# Patient Record
Sex: Male | Born: 1935 | Race: Black or African American | Hispanic: No | Marital: Married | Smoking: Former smoker
Health system: Southern US, Community
[De-identification: ages and names within clinical notes are randomized; demographics above are authoritative.]

## PROBLEM LIST (undated history)

## (undated) DIAGNOSIS — C801 Malignant (primary) neoplasm, unspecified: Secondary | ICD-10-CM

## (undated) DIAGNOSIS — E7800 Pure hypercholesterolemia, unspecified: Secondary | ICD-10-CM

---

## 2006-03-11 ENCOUNTER — Encounter: Admission: RE | Admit: 2006-03-11 | Discharge: 2006-03-11 | Payer: Self-pay | Admitting: Internal Medicine

## 2017-09-29 DIAGNOSIS — C801 Malignant (primary) neoplasm, unspecified: Secondary | ICD-10-CM

## 2017-09-29 HISTORY — DX: Malignant (primary) neoplasm, unspecified: C80.1

## 2017-10-15 ENCOUNTER — Encounter: Payer: Self-pay | Admitting: Radiation Oncology

## 2017-10-20 NOTE — Progress Notes (Signed)
  Oncology Nurse Navigator Documentation  Navigator Location: [facility] (10/20/17 1641) Referral date to RadOnc/MedOnc: 10/14/17 (10/20/17 1641) )Navigator Encounter Type: Introductory phone call (10/20/17 1641)   Abnormal Finding Date: 09/29/17 (10/20/17 1641) Confirmed Diagnosis Date: 09/29/17 (10/20/17 1641)                 Treatment Phase: Pre-Tx/Tx Discussion (10/20/17 1641) Barriers/Navigation Needs: Coordination of Care (10/20/17 1641)   Interventions: Education;Psycho-social support (10/20/17 1641)  I called patient and his wife to introduce myself and my role as GI navigator. Patient and wife aware of appointment on 10/22/17 with Dr. Burr Medico.    Education Method: Verbal (10/20/17 1641)      Acuity: Level 1 (10/20/17 1641)         Time Spent with Patient: 15 (10/20/17 1641)

## 2017-10-21 ENCOUNTER — Encounter: Payer: Self-pay | Admitting: Radiation Oncology

## 2017-10-21 DIAGNOSIS — C20 Malignant neoplasm of rectum: Secondary | ICD-10-CM | POA: Insufficient documentation

## 2017-10-21 NOTE — Progress Notes (Signed)
GI Location of Tumor / Histology: Rectal Cancer (4.5cm above anal verge)  Adrian Banks presented  months ago with symptoms of: anemia  Biopsies of  (if applicable) YMEBRAXE:94/07/680:SU intestine ascending Colon,Polyp=tubular adenoma ,LG Intestine Rectum bx= Invasive moderately differentiated adenocarcinoma,  Past/Anticipated interventions by surgeon, if any: Dr. Earnstine Regal, MD 09/29/17 :Colonoscopy with biopsy, follow up office 2 months   Past/Anticipated interventions by medical oncology, if any:  Dr. Burr Medico appt 10/22/17   Weight changes, if any:  5 lb loss,   Bowel/Bladder complaints, if any:   loose stools, bladder normal  Nausea / Vomiting, if any: No Pain issues, if any:  No  Any blood per rectum:  no  SAFETY ISSUES: No  Prior radiation? NO  Pacemaker/ICD?NO Is the patient on methotrexate?  Current Complaints/Details:    Chronic kidney disease, Occasional alcohol use,   Vitals taken in Med/Onc today: T=98.3,B/P=151/88,P=98,RR=18,Oxygen sats=100% Weight=166.4lb  Allergies:NKA Wt Readings from Last 3 Encounters:  10/22/17 166 lb 4.8 oz (75.4 kg)

## 2017-10-22 ENCOUNTER — Ambulatory Visit (HOSPITAL_BASED_OUTPATIENT_CLINIC_OR_DEPARTMENT_OTHER): Payer: Medicare Other | Admitting: Hematology

## 2017-10-22 ENCOUNTER — Encounter: Payer: Self-pay | Admitting: Radiation Oncology

## 2017-10-22 ENCOUNTER — Ambulatory Visit
Admission: RE | Admit: 2017-10-22 | Discharge: 2017-10-22 | Disposition: A | Discharge status: Home / Self Care | Payer: Medicare Other | Source: Ambulatory Visit | Attending: Radiation Oncology | Admitting: Radiation Oncology

## 2017-10-22 ENCOUNTER — Encounter: Payer: Self-pay | Admitting: Hematology

## 2017-10-22 ENCOUNTER — Ambulatory Visit (HOSPITAL_BASED_OUTPATIENT_CLINIC_OR_DEPARTMENT_OTHER): Payer: Medicare Other

## 2017-10-22 VITALS — Ht 70.0 in

## 2017-10-22 DIAGNOSIS — D509 Iron deficiency anemia, unspecified: Secondary | ICD-10-CM

## 2017-10-22 DIAGNOSIS — D500 Iron deficiency anemia secondary to blood loss (chronic): Secondary | ICD-10-CM

## 2017-10-22 DIAGNOSIS — C20 Malignant neoplasm of rectum: Secondary | ICD-10-CM | POA: Insufficient documentation

## 2017-10-22 DIAGNOSIS — Z79899 Other long term (current) drug therapy: Secondary | ICD-10-CM | POA: Insufficient documentation

## 2017-10-22 DIAGNOSIS — Z809 Family history of malignant neoplasm, unspecified: Secondary | ICD-10-CM | POA: Insufficient documentation

## 2017-10-22 DIAGNOSIS — Z87891 Personal history of nicotine dependence: Secondary | ICD-10-CM | POA: Insufficient documentation

## 2017-10-22 DIAGNOSIS — Z86010 Personal history of colonic polyps: Secondary | ICD-10-CM | POA: Insufficient documentation

## 2017-10-22 DIAGNOSIS — Z510 Encounter for antineoplastic radiation therapy: Secondary | ICD-10-CM | POA: Insufficient documentation

## 2017-10-22 HISTORY — DX: Malignant (primary) neoplasm, unspecified: C80.1

## 2017-10-22 LAB — CBC WITH DIFFERENTIAL/PLATELET
BASO%: 1.4 % (ref 0.0–2.0)
BASOS ABS: 0.1 10*3/uL (ref 0.0–0.1)
EOS%: 0.8 % (ref 0.0–7.0)
Eosinophils Absolute: 0.1 10*3/uL (ref 0.0–0.5)
HCT: 27.6 % — ABNORMAL LOW (ref 38.4–49.9)
HGB: 8 g/dL — ABNORMAL LOW (ref 13.0–17.1)
LYMPH%: 13.3 % — AB (ref 14.0–49.0)
MCH: 18.8 pg — AB (ref 27.2–33.4)
MCHC: 29 g/dL — AB (ref 32.0–36.0)
MCV: 64.9 fL — AB (ref 79.3–98.0)
MONO#: 0.8 10*3/uL (ref 0.1–0.9)
MONO%: 7.9 % (ref 0.0–14.0)
NEUT#: 7.8 10*3/uL — ABNORMAL HIGH (ref 1.5–6.5)
NEUT%: 76.6 % — ABNORMAL HIGH (ref 39.0–75.0)
NRBC: 0 % (ref 0–0)
Platelets: 307 10*3/uL (ref 140–400)
RBC: 4.25 10*6/uL (ref 4.20–5.82)
RDW: 23.6 % — AB (ref 11.0–14.6)
WBC: 10.2 10*3/uL (ref 4.0–10.3)
lymph#: 1.4 10*3/uL (ref 0.9–3.3)

## 2017-10-22 LAB — COMPREHENSIVE METABOLIC PANEL
ALT: 12 U/L (ref 0–55)
AST: 17 U/L (ref 5–34)
Albumin: 3.6 g/dL (ref 3.5–5.0)
Alkaline Phosphatase: 66 U/L (ref 40–150)
Anion Gap: 8 mEq/L (ref 3–11)
BUN: 20 mg/dL (ref 7.0–26.0)
CHLORIDE: 111 meq/L — AB (ref 98–109)
CO2: 23 meq/L (ref 22–29)
Calcium: 9.3 mg/dL (ref 8.4–10.4)
Creatinine: 1.3 mg/dL (ref 0.7–1.3)
GLUCOSE: 93 mg/dL (ref 70–140)
POTASSIUM: 4.1 meq/L (ref 3.5–5.1)
SODIUM: 142 meq/L (ref 136–145)
TOTAL PROTEIN: 7 g/dL (ref 6.4–8.3)
Total Bilirubin: 0.49 mg/dL (ref 0.20–1.20)

## 2017-10-22 LAB — TECHNOLOGIST REVIEW

## 2017-10-22 MED ORDER — CAPECITABINE 500 MG PO TABS: 825.0000 mg/m2 | ORAL_TABLET | Freq: Two times a day (BID) | ORAL | 1 refills | Status: DC

## 2017-10-22 NOTE — Progress Notes (Signed)
nur

## 2017-10-22 NOTE — Progress Notes (Signed)
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   HISTORY OF PRESENT ILLNESS: Adrian Banks is a 83 y.o. male seen at the request of Dr. Marcello Moores for a new diagnosis of rectal cancer. The patient had been experiencing rectal bleeding and anemia and underwent colonoscopy with Dr. Penelope Coop on 09/30/17 revealeing a mass in the rectum  And an ascending colon polyp. His polyp was a tubular adenoma on biopsy, and his rectal mass was an invasive, moderately differentiated adenocarcinom. He was seen by Dr. Marcello Moores and staging scans were ordered as well as an MRI of the pelvis which have not been performed yet. He comes today to discuss options of treatment for his cancer.   PREVIOUS RADIATION THERAPY: No   PAST MEDICAL HISTORY:  Past Medical History:  Diagnosis Date  . Cancer ([place]) 09/29/2017   Rectal cvancer       PAST SURGICAL HISTORY:No past surgical history on file.   FAMILY HISTORY:  Family History  Problem Relation Age of Onset  . Cancer Mother        cancer (unknown type)     SOCIAL HISTORY:  reports that he quit smoking about 30 years ago. He has a 5.00 pack-year smoking history. he has never used smokeless tobacco. He reports that he drinks alcohol. He reports that he does not use drugs. The patient is married and lives in [place].   ALLERGIES: Patient has no allergy information on record.   MEDICATIONS:  Current Outpatient Medications  Medication Sig Dispense Refill  . finasteride (PROSCAR) 5 MG tablet Take 5 mg by mouth.    . lovastatin (MEVACOR) 40 MG tablet Take 40 mg by mouth.     No current facility-administered medications for this encounter.      REVIEW OF SYSTEMS: On  review of systems, the patient reports that he is doing well overall. He reports he does not notice blood in his stool, but has had darker stool since taking iron. He denies any chest pain, shortness of breath, cough, fevers, chills, night sweats, unintended weight changes. He denies any bowel or bladder disturbances, and denies abdominal pain, nausea or vomiting. He denies any new musculoskeletal or joint aches or pains. A complete review of systems is obtained and is otherwise negative.     PHYSICAL EXAM:  Wt Readings from Last 3 Encounters:  10/22/17 166 lb 4.8 oz (75.4 kg)   Temp Readings from Last 3 Encounters:  10/22/17 98.3 F (36.8 C) (Oral)   BP Readings from Last 3 Encounters:  10/22/17 (!) 151/88   Pulse Readings from Last 3 Encounters:  10/22/17 98   Pain Assessment Pain Score: 0-No pain/10  In general this is a well appearing African American male in no acute distress. He is alert and oriented x4 and appropriate throughout the examination. HEENT reveals that the patient is normocephalic, atraumatic. EOMs are intact. PERRLA. Skin is intact without any evidence of gross lesions. Cardiovascular exam reveals a regular rate and rhythm, no clicks rubs or murmurs are auscultated. Chest is clear to auscultation bilaterally. Lymphatic assessment is performed and does not reveal any adenopathy in the cervical, supraclavicular, axillary, or inguinal chains. Abdomen has active bowel sounds  in all quadrants and is intact. The abdomen is soft, non tender, non distended. Lower extremities are negative for pretibial pitting edema, deep calf tenderness, cyanosis or clubbing. Rectal exam is deferred.   ECOG = 0  0 - Asymptomatic (Fully active, able to carry on all predisease activities without restriction)  1 - Symptomatic but completely ambulatory (Restricted in physically strenuous activity but ambulatory and able to carry out work of a light or sedentary nature. For example, light  housework, office work)  2 - Symptomatic, <50% in bed during the day (Ambulatory and capable of all self care but unable to carry out any work activities. Up and about more than 50% of waking hours)  3 - Symptomatic, >50% in bed, but not bedbound (Capable of only limited self-care, confined to bed or chair 50% or more of waking hours)  4 - Bedbound (Completely disabled. Cannot carry on any self-care. Totally confined to bed or chair)  5 - Death   Eustace Pen MM, Creech RH, Tormey DC, et al. [phone]). "Toxicity and response criteria of the [facility]". [place] Oncol. 5 (6): 649-55    LABORATORY DATA:  No results found for: WBC, HGB, HCT, MCV, PLT No results found for: NA, K, CL, CO2 No results found for: ALT, AST, GGT, ALKPHOS, BILITOT    RADIOGRAPHY: No results found.     IMPRESSION/PLAN: 1. Unstaged Adenocarcinoma of the Rectum. Dr. Lisbeth Renshaw discusses the pathology findings and reviews the nature of adenocarcinoma of the rectum. Dr. Lisbeth Renshaw discusses the importance of completing his staging scans. We will await the results of this, but based on his diagnosis, we would anticipate a role for ChemoRT. Dr. Lisbeth Renshaw discusses the rationale for this approach. We discussed the risks, benefits, short, and long term effects of radiotherapy, and the patient is interested in proceeding. Dr. Lisbeth Renshaw discusses the delivery and logistics of radiotherapy and anticipates a course of 5 1/2 weeks. We will follow up with him after he's had imaging. We've tentatively set up an appointment for simulation on Wednesday 10/28/17, and would anticipate starting the week of 11/02/17.   The above documentation reflects my direct findings during this shared patient visit. Please see the separate note by Dr. Lisbeth Renshaw on this date for the remainder of the patient's plan of care.    Carola Rhine, PAC

## 2017-10-22 NOTE — Progress Notes (Signed)
Please see the Nurse Progress Note in the MD Initial Consult Encounter for this patient. 

## 2017-10-22 NOTE — Progress Notes (Signed)
[place]  [phone] Fax:([phone]) [place] Note   [name] 10/22/2017  REFERRAL PHYSICIAN: Dr. Marcello Moores   CHIEF COMPLAINTS/PURPOSE OF CONSULTATION:  Rectal Cancer    Rectal cancer ([place])   09/29/2017 Procedure    Colonoscopy by Dr. Penelope Coop 09/29/17  Findings:  The perianal and digital rectal examination were normal.  A non-obstructing large mass was found in the proximal rectum and in the mid rectum.  The mass was circumferential.  This was biopsied with a cold forceps for histology.  Area was tattooed with an injection of [place] ink both distal and proximal to the mass.  There is about 4-5 cm of rectal mucosa distal to the mass that looks norma. This mass is about 7-8 cm in length.  A 10 mm polyp was found in the ascending colon. The polyp was sessile. the polyp was removed with a hot snare.  Resection and retrieval were complete.        09/29/2017 Initial Biopsy    FINAL DIAGNOSIS 09/29/17  1. LG Intestine - Ascending Colon Polyp:  TUBULAR ADENOMA 2. LG Intestine - Rectum, Bx: INVASIVE MODERATELY DIFFERENTIATED ADENOCARCINOMA. THE MSI TESTING IS PENDING AND THE RESULT WILL BE REPORTED AS AN ADDENDUM.      10/21/2017 Initial Diagnosis    Rectal cancer ([place])        HISTORY OF PRESENTING ILLNESS: 10/22/17  Adrian Banks 83 y.o. male is here because of newly diagnosed rectal cancer. He was referred by Dr. Marcello Moores. He presents to the clinic today by himself.   Pt is a poor historian. He denies any significant abdominal discomfort, nausea, bloating, rectal bleeding or change in bowel habits.  He was referred to GI doctor [place] for colonoscopy by his PCP due to deficient anemia, which showed a nonobstructing mass in the proximal rectum to mid rectum, biopsy showed invasive adenocarcinoma.  He was referred to colorectal surgeon Dr. Marcello Moores, who recommended surgical  resection.  Marcello Moores referred patient to [place] and to Dr. Lisbeth Renshaw for evaluation of new adjuvant chemoradiation.  Marcello Moores has ordered staging CT chest, abdomen and pelvis, and pelvic MRI for staging these scans has not been scheduled yet.   He has had some dysuria in the past few months, was seen by his PCP and diagnosed with BPH. He started proscar.  He is married, lives with his wife, daughter independent and functions well.   REVIEW OF SYSTEMS:   Constitutional: Denies fevers, chills or abnormal night sweats, (+) mild fatigue  Eyes: Denies blurriness of vision, double vision or watery eyes Ears, nose, mouth, throat, and face: Denies mucositis or sore throat Respiratory: Denies cough, dyspnea or wheezes Cardiovascular: Denies palpitation, chest discomfort or lower extremity swelling Gastrointestinal:  Denies nausea, heartburn or change in bowel habits Skin: Denies abnormal skin rashes Lymphatics: Denies new lymphadenopathy or easy bruising Neurological:Denies numbness, tingling or new weaknesses Behavioral/Psych: Mood is stable, no new changes  All other systems were reviewed with the patient and are negative.   MEDICAL HISTORY:  Past Medical History:  Diagnosis Date  . Cancer ([place]) 09/29/2017   Rectal cvancer    SURGICAL HISTORY: History reviewed. No pertinent surgical history.  SOCIAL HISTORY: Social History   Socioeconomic History  . Marital status: Married    Spouse name: Not on file  . Number of children: Not on file  . Years of education: Not on file  . Highest education level: Not  on file  Social Needs  . Financial resource strain: Not on file  . Food insecurity - worry: Not on file  . Food insecurity - inability: Not on file  . Transportation needs - medical: Not on file  . Transportation needs - non-medical: Not on file  Occupational History  . Not on file  Tobacco Use  . Smoking status: Former Smoker    Packs/day: 0.50    Years: 10.00    Pack years: 5.00    Last  attempt to quit: 1988    Years since quitting: 30.8  . Smokeless tobacco: Never Used  Substance and Sexual Activity  . Alcohol use: Yes    Comment: ocassionally   . Drug use: No  . Sexual activity: Not on file  Other Topics Concern  . Not on file  Social History Narrative  . Not on file    FAMILY HISTORY: Family History  Problem Relation Age of Onset  . Cancer Mother        cancer (unknown type)    ALLERGIES:  has no allergies on file.  MEDICATIONS:  Current Outpatient Medications  Medication Sig Dispense Refill  . finasteride (PROSCAR) 5 MG tablet Take 5 mg by mouth.    . lovastatin (MEVACOR) 40 MG tablet Take 40 mg by mouth.    . capecitabine (XELODA) 500 MG tablet Take 3 tablets (1,500 mg total) 2 (two) times daily after a meal by mouth. 90 tablet 1   No current facility-administered medications for this visit.     REVIEW OF SYSTEMS:   Constitutional: Denies fevers, chills or abnormal night sweats Eyes: Denies blurriness of vision, double vision or watery eyes Ears, nose, mouth, throat, and face: Denies mucositis or sore throat Respiratory: Denies cough, dyspnea or wheezes Cardiovascular: Denies palpitation, chest discomfort or lower extremity swelling Gastrointestinal:  Denies nausea, heartburn or change in bowel habits Skin: Denies abnormal skin rashes Lymphatics: Denies new lymphadenopathy or easy bruising Neurological:Denies numbness, tingling or new weaknesses Behavioral/Psych: Mood is stable, no new changes  All other systems were reviewed with the patient and are negative.  PHYSICAL EXAMINATION: ECOG PERFORMANCE STATUS: 0 - Asymptomatic  Vitals:   10/22/17 1236  BP: (!) 151/88  Pulse: 98  Resp: 18  Temp: 98.3 F (36.8 C)  SpO2: 100%   Filed Weights   10/22/17 1236  Weight: 166 lb 4.8 oz (75.4 kg)    GENERAL:alert, no distress and comfortable SKIN: skin color, texture, turgor are normal, no rashes or significant lesions EYES: normal,  conjunctiva are pink and non-injected, sclera clear OROPHARYNX:no exudate, no erythema and lips, buccal mucosa, and tongue normal  NECK: supple, thyroid normal size, non-tender, without nodularity LYMPH:  no palpable lymphadenopathy in the cervical, axillary or inguinal LUNGS: clear to auscultation and percussion with normal breathing effort HEART: regular rate & rhythm and no murmurs and no lower extremity edema ABDOMEN:abdomen soft, non-tender and normal bowel sounds, rectal exam was deferred today  Musculoskeletal:no cyanosis of digits and no clubbing  PSYCH: alert & oriented x 3 with fluent speech NEURO: no focal motor/sensory deficits  LABORATORY DATA:  I have reviewed the data as listed CBC Latest Ref Rng & Units 10/22/2017  WBC 4.0 - 10.3 10e3/uL 10.2  Hemoglobin 13.0 - 17.1 g/dL 8.0(L)  Hematocrit 38.4 - 49.9 % 27.6(L)  Platelets 140 - 400 10e3/uL 307    CMP Latest Ref Rng & Units 10/22/2017  Glucose 70 - 140 mg/dl 93  BUN 7.0 - 26.0 mg/dL 20.0  Creatinine 0.7 - 1.3 mg/dL 1.3  Sodium 136 - 145 mEq/L 142  Potassium 3.5 - 5.1 mEq/L 4.1  CO2 22 - 29 mEq/L 23  Calcium 8.4 - 10.4 mg/dL 9.3  Total Protein 6.4 - 8.3 g/dL 7.0  Total Bilirubin 0.20 - 1.20 mg/dL 0.49  Alkaline Phos 40 - 150 U/L 66  AST 5 - 34 U/L 17  ALT 0 - 55 U/L 12    PATHOLOGY   FINAL DIAGNOSIS 09/29/17  1. LG Intestine - Ascending Colon Polyp:  TUBULAR ADENOMA 2. LG Intestine - Rectum, Bx: INVASIVE MODERATELY DIFFERENTIATED ADENOCARCINOMA. THE MSI TESTING IS PENDING AND THE RESULT WILL BE REPORTED AS AN ADDENDUM.    PROCEDURES   Colonoscopy by Dr. Penelope Coop 09/29/17  Findings:  The perianal and digital rectal examination were normal.  A non-obstructing large mass was found in the proximal rectum and in the mid rectum.  The mass was circumferential.  This was biopsied with a cold forceps for histology.  Area was tattooed with an injection of [place] ink both distal and proximal to the mass.  There is  about 4-5 cm of rectal mucosa distal to the mass that looks norma. This mass is about 7-8 cm in length.  A 10 mm polyp was found in the ascending colon. The polyp was sessile. the polyp was removed with a hot snare.  Resection and retrieval were complete.     RADIOGRAPHIC STUDIES: I have personally reviewed the radiological images as listed and agreed with the findings in the report. No results found.  ASSESSMENT & PLAN:  Adrian Banks is a 83 y.o. male without a significant past medical history, presented with iron deficient anemia.  He underwent a colonoscopy which showed a non-obstructive mass from proximal rectum to mid rectum.   1. Rectal Cancer, invasive adenocarcinoma, TxNxMx -I reviewed his colonoscopy and biopsy pathology results in great details with patient  -He is staging CT chest, abdomen and pelvis with contrast and pelvic MRI was ordered by his surgeon Dr. Marcello Moores, pending schedule -From his Crohn anoscopy finding, this is likely a locally advanced rectal cancer.  If he has no metastatic disease, he probably will benefit from neoadjuvant chemotherapy.  We discussed that neoadjuvant chemo and radiation is a standard care for T3/4 or node positive disease  -I reviewed the benefits and logistics of neoadjuvant   Although he is 83 years old, he has no significant medical comorbidities, good performance status, will be a candidate for current chemo and radiation.  I discussed the option of Xeloda versus intravenous 5-FU continuous infusion.  Due to the convenience and better tolerance, I recommend him to consider Xeloda 83m/m2 q12h with concurrent radiation.  --Chemotherapy consent: Side effects including but does not not limited to, fatigue, nausea, vomiting, diarrhea, hair loss, neuropathy, fluid retention, renal and kidney dysfunction, neutropenic fever, needed for blood transfusion, bleeding, coronary artery spasm, skin toxicities, were discussed with patient in great detail. He  agrees to proceed. -Plan to start in a few week, we will try to get his staging scans done next week  -due to his advanced age, if he has poor tolerance to concurrent chemo and radiation, I will probably decrease his chemo dose or hold chemo, and continue his radiation.   2. Anemia, iron deficient  -Labs today showed anemia with hemoglobin 8.0, MCV 64.9, normal WBC and platelet, this is likely iron deficient anemia, secondary to his rectal cancer bleeding. -I will check his iron level, and set up his IV  iron infusion  PLAN: -will schedule his CT CAP with contrast and pelvic MRI for staging next week -check iron study and set up iv iron  -I will see him back in 1-2 weeks after staging scans, to finalize his treatment plan  -will send Xeloda prescription out to get him ready for neoadjuvant chemo and radiation  -I spoke with Dr. Lisbeth Renshaw today to coordinate his care    Orders Placed This Encounter  Procedures  . CBC with Differential    Standing Status:   Standing    Number of Occurrences:   50    Standing Expiration Date:   10/22/2022  . Comprehensive metabolic panel    Standing Status:   Standing    Number of Occurrences:   50    Standing Expiration Date:   10/22/2022  . CEA    Standing Status:   Standing    Number of Occurrences:   50    Standing Expiration Date:   10/22/2022    All questions were answered. The patient knows to call the clinic with any problems, questions or concerns.  I spent 40 minutes counseling the patient face to face. The total time spent in the appointment was 55 minutes and more than 50% was on counseling.     Truitt Merle, MD 10/22/2017 11:41 PM   This document serves as a record of services personally performed by Truitt Merle, MD. It was created on her behalf by Joslyn Devon, a trained medical scribe. The creation of this record is based on the scribe's personal observations and the provider's statements to them.    I have reviewed the above  documentation for accuracy and completeness, and I agree with the above.

## 2017-10-22 NOTE — Progress Notes (Signed)
  Oncology Nurse Navigator Documentation  Navigator Location: [facility] (10/22/17 1412)   )Navigator Encounter Type: Initial MedOnc (10/22/17 1412)                     Patient Visit Type: MedOnc;Initial (10/22/17 1412) Treatment Phase: Pre-Tx/Tx Discussion (10/22/17 1412) Barriers/Navigation Needs: Education (10/22/17 1412) Education: Understanding Cancer/ Treatment Options (10/22/17 1412) Interventions: Education;Psycho-social support (10/22/17 1412)  I met with patient before and during his office visit with Dr. Burr Medico. I asked patient how he learns best and he requested written materials. I provided patient with literature from [employer] related to Colon/rectal cancer.  Patient was unaccompanied during his office visit but stated that he has adult children in the area. I explained to patient with his consent we can speak to someone from his family to review what was discussed during his visit. I accompanied patient to his rad/onc appointment. Dr. Burr Medico requested that patient have labs today. I called down to rad/onc and spoke with Sheppard Plumber and she will send patient to lab after he is finished with Dr. Lisbeth Renshaw. This patient will benefit from repeated educational opportunities.   Education Method: Verbal;Written (10/22/17 1412)      Acuity: Level 2 (10/22/17 1412)   Acuity Level 2: Ongoing guidance and education throughout treatment as needed;Educational needs;Initial guidance, education and coordination as needed (10/22/17 1412)     Time Spent with Patient: 30 (10/22/17 1412)

## 2017-10-23 ENCOUNTER — Encounter: Payer: Self-pay | Admitting: Hematology

## 2017-10-23 ENCOUNTER — Other Ambulatory Visit: Payer: Self-pay | Admitting: General Surgery

## 2017-10-23 DIAGNOSIS — D500 Iron deficiency anemia secondary to blood loss (chronic): Secondary | ICD-10-CM | POA: Insufficient documentation

## 2017-10-23 DIAGNOSIS — C20 Malignant neoplasm of rectum: Secondary | ICD-10-CM

## 2017-10-23 LAB — CEA (IN HOUSE-CHCC): CEA (CHCC-In House): 8.16 ng/mL — ABNORMAL HIGH (ref 0.00–5.00)

## 2017-10-23 MED ORDER — CAPECITABINE 500 MG PO TABS: 825.0000 mg/m2 | ORAL_TABLET | Freq: Two times a day (BID) | ORAL | 1 refills | Status: DC

## 2017-10-23 NOTE — Progress Notes (Signed)
  Oncology Nurse Navigator Documentation  Navigator Location: [facility] (10/23/17 1400)   )                              Interventions: Coordination of Care (10/23/17 1400)   Coordination of Care: Appts (10/23/17 1400)  Per Dr. Ernestina Penna request, CT scan and MR rescheduled for next week prior to initiating chemo and radiation. Central scheduling to call patient with new dates and times and pertinent instructions.     Acuity: Level 2 (10/23/17 1400)   Acuity Level 2: Assistance expediting appointments (10/23/17 1400)     Time Spent with Patient: 30 (10/23/17 1400)

## 2017-10-26 ENCOUNTER — Telehealth: Payer: Self-pay | Admitting: Pharmacist

## 2017-10-26 DIAGNOSIS — C20 Malignant neoplasm of rectum: Secondary | ICD-10-CM

## 2017-10-26 MED ORDER — CAPECITABINE 500 MG PO TABS: ORAL_TABLET | ORAL | 0 refills | Status: DC

## 2017-10-26 MED ORDER — CAPECITABINE 500 MG PO TABS: 825.0000 mg/m2 | ORAL_TABLET | Freq: Two times a day (BID) | ORAL | 0 refills | Status: DC

## 2017-10-26 NOTE — Telephone Encounter (Signed)
Oral Oncology Pharmacist Encounter  Received new prescription for Xeloda (capecitabine) for the neoadjuvant treatment of newly diagnosed rectal cancer in conjunction with radiation, planned duration 5-6 weeks of therapy for neoadjuvant course. CT simulation scheduled for 11/21, noted hopeful radiation/Xeloda start date 11/02/17.  Labs from 10/22/17 assessed, OK for treatment. Noted SCr=1.3, est CrCl ~45 mL/min, OK for treatment  Current medication list in Epic reviewed, no DDIs with Xeloda identified.  Prescription has been e-scribed to the [facility] for benefits analysis and approval. No prior authorization was required, however copayment for entire radiation course $709.53. No copayment support is currently available for Xeloda and patient's diagnosis. There is no manufacturer assistance programs for this medication. This will be discussed with MD prior to speaking to patient and dispensing from pharmacy.  Oral Oncology Clinic will continue to follow for copayment issues, initial counseling and start date.  Johny Drilling, PharmD, BCPS, BCOP 10/26/2017 10:22 AM Oral Oncology Clinic [phone]

## 2017-10-28 ENCOUNTER — Ambulatory Visit
Admission: RE | Admit: 2017-10-28 | Discharge: 2017-10-28 | Disposition: A | Discharge status: Home / Self Care | Payer: Medicare Other | Source: Ambulatory Visit | Attending: Radiation Oncology | Admitting: Radiation Oncology

## 2017-10-28 ENCOUNTER — Telehealth: Payer: Self-pay | Admitting: Hematology

## 2017-10-28 ENCOUNTER — Ambulatory Visit (HOSPITAL_COMMUNITY)
Admission: RE | Admit: 2017-10-28 | Discharge: 2017-10-28 | Disposition: A | Discharge status: Home / Self Care | Payer: Medicare Other | Source: Ambulatory Visit | Attending: General Surgery | Admitting: General Surgery

## 2017-10-28 ENCOUNTER — Other Ambulatory Visit: Payer: Self-pay | Admitting: Hematology

## 2017-10-28 ENCOUNTER — Encounter (HOSPITAL_COMMUNITY): Payer: Self-pay

## 2017-10-28 DIAGNOSIS — R918 Other nonspecific abnormal finding of lung field: Secondary | ICD-10-CM | POA: Insufficient documentation

## 2017-10-28 DIAGNOSIS — Z87891 Personal history of nicotine dependence: Secondary | ICD-10-CM | POA: Diagnosis not present

## 2017-10-28 DIAGNOSIS — I700 Atherosclerosis of aorta: Secondary | ICD-10-CM | POA: Insufficient documentation

## 2017-10-28 DIAGNOSIS — C20 Malignant neoplasm of rectum: Secondary | ICD-10-CM | POA: Insufficient documentation

## 2017-10-28 DIAGNOSIS — N210 Calculus in bladder: Secondary | ICD-10-CM | POA: Diagnosis not present

## 2017-10-28 DIAGNOSIS — Z86010 Personal history of colonic polyps: Secondary | ICD-10-CM | POA: Diagnosis not present

## 2017-10-28 DIAGNOSIS — N200 Calculus of kidney: Secondary | ICD-10-CM | POA: Insufficient documentation

## 2017-10-28 DIAGNOSIS — Z510 Encounter for antineoplastic radiation therapy: Secondary | ICD-10-CM | POA: Diagnosis not present

## 2017-10-28 DIAGNOSIS — Z809 Family history of malignant neoplasm, unspecified: Secondary | ICD-10-CM | POA: Diagnosis not present

## 2017-10-28 DIAGNOSIS — Z79899 Other long term (current) drug therapy: Secondary | ICD-10-CM | POA: Diagnosis not present

## 2017-10-28 MED ORDER — IOPAMIDOL (ISOVUE-300) INJECTION 61%
INTRAVENOUS | Status: AC
  Filled 2017-10-28: qty 100

## 2017-10-28 MED ORDER — IOPAMIDOL (ISOVUE-300) INJECTION 61%
100.0000 mL | Freq: Once | INTRAVENOUS | Status: AC | PRN
  Administered 2017-10-28: 100 mL via INTRAVENOUS

## 2017-10-28 NOTE — Telephone Encounter (Signed)
Spoke with patient dtr re appointments for 11/26 and 12/3. Message to [place] re seeing patient in infusion due to infusion time.

## 2017-10-28 NOTE — Telephone Encounter (Signed)
Oral Oncology Pharmacist Encounter  Attempted to contact patient to update on high copayment for Xeloda and offer initial counseling.  No answer on provided number in Epic, voicemail not set up so unable to leave a message.  Xeloda copayment $700.  Patient may be eligible for [place] ($400) if able to be signed up for this fund by financial advocates in radiation oncology. That would still leave out of pocket expense for patient at $300 for entire course of concurrent chemoradiation therapy.  Oral Oncology Clinic will continue to follow for copayment issues, initial counseling and start date.  Johny Drilling, PharmD, BCPS, BCOP 10/28/2017 3:31 PM Oral Oncology Clinic [phone]

## 2017-10-30 ENCOUNTER — Ambulatory Visit (HOSPITAL_COMMUNITY): Admission: RE | Admit: 2017-10-30 | Payer: Medicare Other | Source: Ambulatory Visit

## 2017-11-02 ENCOUNTER — Ambulatory Visit (HOSPITAL_BASED_OUTPATIENT_CLINIC_OR_DEPARTMENT_OTHER): Payer: Medicare Other | Admitting: Hematology

## 2017-11-02 ENCOUNTER — Encounter: Payer: Self-pay | Admitting: Hematology

## 2017-11-02 ENCOUNTER — Ambulatory Visit (HOSPITAL_BASED_OUTPATIENT_CLINIC_OR_DEPARTMENT_OTHER): Payer: Medicare Other

## 2017-11-02 ENCOUNTER — Telehealth: Payer: Self-pay

## 2017-11-02 ENCOUNTER — Other Ambulatory Visit (HOSPITAL_BASED_OUTPATIENT_CLINIC_OR_DEPARTMENT_OTHER): Payer: Medicare Other

## 2017-11-02 ENCOUNTER — Other Ambulatory Visit: Payer: Medicare Other

## 2017-11-02 ENCOUNTER — Ambulatory Visit (HOSPITAL_COMMUNITY)
Admission: RE | Admit: 2017-11-02 | Discharge: 2017-11-02 | Disposition: A | Discharge status: Home / Self Care | Payer: Medicare Other | Source: Ambulatory Visit | Attending: Hematology | Admitting: Hematology

## 2017-11-02 ENCOUNTER — Other Ambulatory Visit: Payer: Self-pay | Admitting: *Deleted

## 2017-11-02 ENCOUNTER — Ambulatory Visit: Payer: Medicare Other | Admitting: Hematology

## 2017-11-02 ENCOUNTER — Ambulatory Visit: Payer: Medicare Other

## 2017-11-02 VITALS — BP 132/67 | HR 76 | Temp 98.1°F | Resp 17 | Wt 165.5 lb

## 2017-11-02 DIAGNOSIS — D509 Iron deficiency anemia, unspecified: Secondary | ICD-10-CM

## 2017-11-02 DIAGNOSIS — C20 Malignant neoplasm of rectum: Secondary | ICD-10-CM | POA: Diagnosis not present

## 2017-11-02 DIAGNOSIS — D500 Iron deficiency anemia secondary to blood loss (chronic): Secondary | ICD-10-CM | POA: Diagnosis present

## 2017-11-02 DIAGNOSIS — R911 Solitary pulmonary nodule: Secondary | ICD-10-CM

## 2017-11-02 DIAGNOSIS — R918 Other nonspecific abnormal finding of lung field: Secondary | ICD-10-CM | POA: Diagnosis not present

## 2017-11-02 DIAGNOSIS — Z7189 Other specified counseling: Secondary | ICD-10-CM

## 2017-11-02 LAB — COMPREHENSIVE METABOLIC PANEL
ALT: 12 U/L (ref 0–55)
ANION GAP: 9 meq/L (ref 3–11)
AST: 16 U/L (ref 5–34)
Albumin: 3.3 g/dL — ABNORMAL LOW (ref 3.5–5.0)
Alkaline Phosphatase: 63 U/L (ref 40–150)
BILIRUBIN TOTAL: 0.47 mg/dL (ref 0.20–1.20)
BUN: 19.6 mg/dL (ref 7.0–26.0)
CALCIUM: 8.9 mg/dL (ref 8.4–10.4)
CO2: 22 meq/L (ref 22–29)
CREATININE: 1.2 mg/dL (ref 0.7–1.3)
Chloride: 110 mEq/L — ABNORMAL HIGH (ref 98–109)
EGFR: >60 mL/min/{1.73_m2} (ref >60)
Glucose: 100 mg/dl (ref 70–140)
Potassium: 3.7 mEq/L (ref 3.5–5.1)
Sodium: 141 mEq/L (ref 136–145)
TOTAL PROTEIN: 6.6 g/dL (ref 6.4–8.3)

## 2017-11-02 LAB — CBC WITH DIFFERENTIAL/PLATELET
BASO%: 2.3 % — ABNORMAL HIGH (ref 0.0–2.0)
BASOS ABS: 0.2 10*3/uL — AB (ref 0.0–0.1)
EOS%: 2.9 % (ref 0.0–7.0)
Eosinophils Absolute: 0.3 10*3/uL (ref 0.0–0.5)
HCT: 25.5 % — ABNORMAL LOW (ref 38.4–49.9)
HGB: 7.3 g/dL — ABNORMAL LOW (ref 13.0–17.1)
LYMPH%: 15.6 % (ref 14.0–49.0)
MCH: 18.5 pg — ABNORMAL LOW (ref 27.2–33.4)
MCHC: 28.6 g/dL — AB (ref 32.0–36.0)
MCV: 64.7 fL — AB (ref 79.3–98.0)
MONO#: 0.9 10*3/uL (ref 0.1–0.9)
MONO%: 10.3 % (ref 0.0–14.0)
NEUT#: 5.9 10*3/uL (ref 1.5–6.5)
NEUT%: 68.9 % (ref 39.0–75.0)
NRBC: 0 % (ref 0–0)
Platelets: 306 10*3/uL (ref 140–400)
RBC: 3.94 10*6/uL — AB (ref 4.20–5.82)
RDW: 22.2 % — AB (ref 11.0–14.6)
WBC: 8.5 10*3/uL (ref 4.0–10.3)
lymph#: 1.3 10*3/uL (ref 0.9–3.3)

## 2017-11-02 LAB — ABO/RH: ABO/RH(D): A POS

## 2017-11-02 LAB — CEA (IN HOUSE-CHCC): CEA (CHCC-IN HOUSE): 8.1 ng/mL — AB (ref 0.00–5.00)

## 2017-11-02 LAB — PREPARE RBC (CROSSMATCH)

## 2017-11-02 LAB — TECHNOLOGIST REVIEW

## 2017-11-02 MED ORDER — SODIUM CHLORIDE 0.9 % IV SOLN
510.0000 mg | Freq: Once | INTRAVENOUS | Status: AC
  Administered 2017-11-02: 510 mg via INTRAVENOUS
  Filled 2017-11-02: qty 17

## 2017-11-02 MED ORDER — SODIUM CHLORIDE 0.9 % IV SOLN
Freq: Once | INTRAVENOUS | Status: AC
  Administered 2017-11-02: 08:00:00 via INTRAVENOUS

## 2017-11-02 MED FILL — CAPECITABINE 500 MG TABLET: 500 | 20 days supply | Qty: 90 | Fill #0

## 2017-11-02 NOTE — Telephone Encounter (Signed)
Oral Chemotherapy Pharmacist Encounter   I spoke with patient for overview of: Xeloda.   Pt is doing well. Counseled patient on administration, dosing, side effects, monitoring, drug-food interactions, safe handling, storage, and disposal.  Patient will take Xeloda 500mg  tablets, 3 tablets (1500mg ) by mouth in AM and 3 tabs (1500mg ) by mouth in PM, within 30 minutes of finishing meals, on days of radiation only. Xeloda and radiation start date: 11/05/17  Side effects include but not limited to: fatigue, decreased blood counts, GI upset, diarrhea, and hand-foot syndrome. Patient know he can take loperamide at home and will call the office if diarrhea develops.    Reviewed with patient importance of keeping a medication schedule and plan for any missed doses.  Mr. Lamagna voiced understanding and appreciation.   All questions answered.  Patient informed about copayment by [facility], he can afford this if it is split into 2 fills and pays half now and half in 2 weeks. This will be coordinated with the pharmacy and patient plans to pick up his Xeloda tomorrow 11/27 Patient knows to wait until 11/29 to start his Xeloda.   Medication reconciliation performed and medication/allergy list updated.  Patient knows to call the office with questions or concerns. [facility] will continue to follow.  Thank you,  Johny Drilling, PharmD, BCPS, BCOP 11/02/2017  4:03 PM [facility] [phone]

## 2017-11-02 NOTE — Patient Instructions (Signed)

## 2017-11-02 NOTE — Progress Notes (Signed)
[facility]  Telephone:(336) [phone] Fax:(336) [phone]  Clinic Follow Up Note   [name] 11/02/2017   CHIEF COMPLAINTS:  Follow up rectal Cancer    Rectal cancer ([place])   09/29/2017 Procedure    Colonoscopy by Dr. Penelope Coop 09/29/17  Findings:  The perianal and digital rectal examination were normal.  A non-obstructing large mass was found in the proximal rectum and in the mid rectum.  The mass was circumferential.  This was biopsied with a cold forceps for histology.  Area was tattooed with an injection of Niger ink both distal and proximal to the mass.  There is about 4-5 cm of rectal mucosa distal to the mass that looks norma. This mass is about 7-8 cm in length.  A 10 mm polyp was found in the ascending colon. The polyp was sessile. the polyp was removed with a hot snare.  Resection and retrieval were complete.        09/29/2017 Initial Biopsy    FINAL DIAGNOSIS 09/29/17  1. LG Intestine - Ascending Colon Polyp:  TUBULAR ADENOMA 2. LG Intestine - Rectum, Bx: INVASIVE MODERATELY DIFFERENTIATED ADENOCARCINOMA. THE MSI TESTING IS PENDING AND THE RESULT WILL BE REPORTED AS AN ADDENDUM.      10/21/2017 Initial Diagnosis    Rectal cancer (Adrian Banks)      10/28/2017 Imaging    CT CAP W Contrast  IMPRESSION: 1. Long segment circumferential wall thickening of the rectosigmoid colon consistent with known adenocarcinoma. No evidence of bowel obstruction or perforation. 2. No definite signs of metastatic disease in the abdomen or pelvis. Soft tissue nodularity in the right pelvis is probably due to an unopacified pelvic vein. 3. Multiple pulmonary nodules, some cavitary. These are not typical of metastatic adenocarcinoma (especially without evidence of abdominal disease). Consider multicentric or metastatic lung cancer. PET-CT may be helpful to assess hypermetabolic activity and guided tissue  sampling. 4. Multiple bladder calculi consistent with chronic bladder outlet obstruction from prostatomegaly. Nonobstructing right renal calculus. No hydronephrosis. 5. Aortic Atherosclerosis (ICD10-I70.0). Additional incidental findings including a moderate size hiatal hernia and renal cysts.        Radiation Therapy    Radiation with Dr. Lisbeth Renshaw With Xeloda, plan to start 11/05/17       Chemotherapy    Chemoradiation with Xeloda 845m/m2 q12h, plan to start 11/05/17        HISTORY OF PRESENTING ILLNESS: 10/22/17  Adrian Banks Comment83y.o. male is here because of newly diagnosed rectal cancer. He was referred by Dr. TMarcello Moores He presents to the clinic today by himself.   Pt is a poor historian. He denies any significant abdominal discomfort, nausea, bloating, rectal bleeding or change in bowel habits.  He was referred to GI doctor Ganem for colonoscopy by his PCP due to deficient anemia, which showed a nonobstructing mass in the proximal rectum to mid rectum, biopsy showed invasive adenocarcinoma.  He was referred to colorectal surgeon Dr. TMarcello Moores who recommended surgical resection.  TMarcello Mooresreferred patient to u[place] to Dr. MLisbeth Renshawfor evaluation of new adjuvant chemoradiation.  TMarcello Mooreshas ordered staging CT chest, abdomen and pelvis, and pelvic MRI for staging these scans has not been scheduled yet.   He has had some dysuria in the past few months, was seen by his PCP and diagnosed with BPH. He started Proscar.  He is married, lives with his wife, daughter independent and functions well.   CURRENT THERAPY: PENDING  Chemoradiation with Xeloda 1516m ( 8181mm2) q12h plans to start 11/05/17   INTERVAL HISTORY:  ClKHIYAN CRACEs here for a follow up. He presents in the infusion room today to get IV Feraheme due to his 7.3 Hg today. He notes he will get SOB and fatigue which he thinks may be related to his anemia. He notes he plans to start radiation with Dr. MoLisbeth Renshawn 11/05/17. He has not  received a call for his Xeloda prescription. He notes to having 3 BM a day and does not have to strain. He thinks he has more gas than hardness of stool. He does not use any stool softer. He understands his age and knows he cannot love forever. So he wonders will these treatments allow for him to have quality of life. That is what matters more to him. He has wife, son and daughter at home who can help him. His other 2 children live elsewhere. He does not want to contact his children about this.   REVIEW OF SYSTEMS:   Constitutional: Denies fevers, chills or abnormal night sweats, (+) fatigue  Eyes: Denies blurriness of vision, double vision or watery eyes Ears, nose, mouth, throat, and face: Denies mucositis or sore throat Respiratory: Denies cough, dyspnea or wheezes (+) occasional SOB Cardiovascular: Denies palpitation, chest discomfort or lower extremity swelling Gastrointestinal:  Denies nausea, heartburn or change in bowel habits Skin: Denies abnormal skin rashes Lymphatics: Denies new lymphadenopathy or easy bruising Neurological:Denies numbness, tingling or new weaknesses Behavioral/Psych: Mood is stable, no new changes  All other systems were reviewed with the patient and are negative.   MEDICAL HISTORY:  Past Medical History:  Diagn[place]  [place]   Rectal [place]cer    SURGICAL HISTORY: History reviewed. No pertinent surgical history.  SOCIAL HISTORY: Social History   Socioeconomic History  . Marital status: Married    Spouse name: Not on file  . Number of children: Not on file  . Years of education: Not on file  . Highest education level: Not on file  Social Needs  . Financial resource strain: Not on file  . Food insecurity - worry: Not on file  . Food insecurity - inability: Not on file  . Transportation needs - medical: Not on file  . Transportation needs - non-medical: Not on file  Occupational History  . Not on file  Tobacco Use  . Smoking  status: Former Smoker    Packs/day: 0.50    Years: 10.00    Pack years: 5.00    Last attempt to quit: 1988    Years since quitting: 30.9  . Smokeless tobacco: Never Used  Substance and Sexual Activity  . Alcohol use: Yes    Comment: ocassionally   . Drug use: No  . Sexual activity: Not on file  Other Topics Concern  . Not on file  Social History Narrative  . Not on file    FAMILY HISTORY: Family History  Problem Relation Age of Onset  . Cancer Mother        cancer (unknown type)    ALLERGIES:  has no allergies on file.  MEDICATIONS:  Current Outpatient Medications  Medication Sig Dispense Refill  . capecitabine (XELODA) 500 MG tablet Take 3 tablets (150088mby mouth 2 times daily, within 30 minutes of food, on radiation days only, M-F 180 tablet 0  . finasteride (PROSCAR) 5 MG tablet Take 5 mg by mouth.    . lovastatin (MEVACOR) 40 MG tablet Take  40 mg by mouth.     No current facility-administered medications for this visit.    PHYSICAL EXAMINATION: ECOG PERFORMANCE STATUS: 1 Weight 165 pounds, blood pressure 132/67, heart rate 76, respiratory rate 17, pulse ox 100% on room air, temperature 36.7 GENERAL:alert, no distress and comfortable SKIN: skin color, texture, turgor are normal, no rashes or significant lesions EYES: normal, conjunctiva are pink and non-injected, sclera clear OROPHARYNX:no exudate, no erythema and lips, buccal mucosa, and tongue normal  NECK: supple, thyroid normal size, non-tender, without nodularity LYMPH:  no palpable lymphadenopathy in the cervical, axillary or inguinal LUNGS: clear to auscultation and percussion with normal breathing effort HEART: regular rate & rhythm and no murmurs and no lower extremity edema ABDOMEN:abdomen soft, non-tender and normal bowel sounds, rectal exam was deferred today  Musculoskeletal:no cyanosis of digits and no clubbing  PSYCH: alert & oriented x 3 with fluent speech NEURO: no focal motor/sensory  deficits  LABORATORY DATA:  I have reviewed the data as listed CBC Latest Ref Rng & Units 11/02/2017 10/22/2017  WBC 4.0 - 10.3 10e3/uL 8.5 10.2  Hemoglobin 13.0 - 17.1 g/dL 7.3(L) 8.0(L)  Hematocrit 38.4 - 49.9 % 25.5(L) 27.6(L)  Platelets 140 - 400 10e3/uL 306 307    CMP Latest Ref Rng & Units 11/02/2017 10/22/2017  Glucose 70 - 140 mg/dl 100 93  BUN 7.0 - 26.0 mg/dL 19.6 20.0  Creatinine 0.7 - 1.3 mg/dL 1.2 1.3  Sodium 136 - 145 mEq/L 141 142  Potassium 3.5 - 5.1 mEq/L 3.7 4.1  CO2 22 - 29 mEq/L 22 23  Calcium 8.4 - 10.4 mg/dL 8.9 9.3  Total Protein 6.4 - 8.3 g/dL 6.6 7.0  Total Bilirubin 0.20 - 1.20 mg/dL 0.47 0.49  Alkaline Phos 40 - 150 U/L 63 66  AST 5 - 34 U/L 16 17  ALT 0 - 55 U/L 12 12    PATHOLOGY   FINAL DIAGNOSIS 09/29/17  1. LG Intestine - Ascending Colon Polyp:  TUBULAR ADENOMA 2. LG Intestine - Rectum, Bx: INVASIVE MODERATELY DIFFERENTIATED ADENOCARCINOMA. THE MSI TESTING IS PENDING AND THE RESULT WILL BE REPORTED AS AN ADDENDUM.    PROCEDURES   Colonoscopy by Dr. Penelope Coop 09/29/17  Findings:  The perianal and digital rectal examination were normal.  A non-obstructing large mass was found in the proximal rectum and in the mid rectum.  The mass was circumferential.  This was biopsied with a cold forceps for histology.  Area was tattooed with an injection of [place] ink both distal and proximal to the mass.  There is about 4-5 cm of rectal mucosa distal to the mass that looks norma. This mass is about 7-8 cm in length.  A 10 mm polyp was found in the ascending colon. The polyp was sessile. the polyp was removed with a hot snare.  Resection and retrieval were complete.     RADIOGRAPHIC STUDIES: I have personally reviewed the radiological images as listed and agreed with the findings in the report. Ct Chest W Contrast  Result Date: 10/28/2017 CLINICAL DATA:  Rectal cancer diagnosed October 12, 2017.  Staging. EXAM: CT CHEST, ABDOMEN, AND PELVIS WITH  CONTRAST TECHNIQUE: Multidetector CT imaging of the chest, abdomen and pelvis was performed following the standard protocol during bolus administration of intravenous contrast. CONTRAST:  132m ISOVUE-300 IOPAMIDOL (ISOVUE-300) INJECTION 61% COMPARISON:  Chest CT 03/11/2006. FINDINGS: CT CHEST FINDINGS Cardiovascular: Mild atherosclerosis of the aorta, great vessels and coronary arteries. No acute vascular findings are demonstrated. The heart size is normal.  There is no pericardial effusion. Mediastinum/Nodes: There are no enlarged mediastinal, hilar or axillary lymph nodes. There is a chronic moderate-size hiatal hernia. The thyroid gland and trachea demonstrate no significant findings. Lungs/Pleura: There is no pleural effusion. Mild emphysema noted. There are several new pulmonary nodules bilaterally. In the right lower lobe, there is a slightly lobulated 2.0 x 1.8 cm nodule on image 96. There is a 5 mm right lower lobe nodule on image 75 and a 10 x 14 mm right middle lobe nodule on image 97. In the left upper lobe, there is a centrally cavitary nodule posteriorly measuring 18 x 13 mm on image 40. There is an additional 7 mm cavitary lesion along the left major fissure on image 67. Musculoskeletal/Chest wall: No chest wall mass or suspicious osseous findings. CT ABDOMEN AND PELVIS FINDINGS Hepatobiliary: The liver is normal in density without focal abnormality. No evidence of gallstones, gallbladder wall thickening or biliary dilatation. Pancreas: Unremarkable. No pancreatic ductal dilatation or surrounding inflammatory changes. Spleen: Normal in size without focal abnormality. Adrenals/Urinary Tract: Mild nodularity of the left adrenal gland is similar to the prior study. The right adrenal gland appears normal. In the upper pole of the right kidney, there is a nonobstructing 9 mm calculus. No other renal or ureteral calculi are demonstrated. However, there are 3 large bladder calculi, measuring up to 22 mm in  diameter. There are multiple low-density renal lesions, measuring up to 6.5 cm posteriorly in the mid right kidney. There are thin calcifications within the wall of this cyst. Those large enough to characterize are consistent with cysts. Some are too small to optimally characterize, although no suspicious renal lesions are seen. Aside from the bladder calculi, the bladder appears unremarkable. Stomach/Bowel: As above, there is a moderate size hiatal hernia which appears chronic. The small bowel and proximal colon appear normal. The appendix appears normal. There is circumferential wall thickening extending from the rectosigmoid junction into the upper rectum (axial images 91-99), spanning approximately 6.8 cm in length on sagittal image 111. No evidence of bowel obstruction or perforation. Vascular/Lymphatic: No definite enlarged abdominopelvic lymph nodes. A 9 x 16 mm nodule in the right pelvis on image 93 is likely venous. There is aortic and branch vessel atherosclerosis. No acute vascular findings. Reproductive: The prostate gland is enlarged, measuring 6.2 x 4.8 cm transverse. It demonstrates heterogeneity and probable median lobe hypertrophy extending into the bladder base. The seminal vesicles appear normal. Other: No evidence of abdominal wall mass or hernia. No ascites. Musculoskeletal: No acute or significant osseous findings. Mild lumbar spondylosis noted. IMPRESSION: 1. Long segment circumferential wall thickening of the rectosigmoid colon consistent with known adenocarcinoma. No evidence of bowel obstruction or perforation. 2. No definite signs of metastatic disease in the abdomen or pelvis. Soft tissue nodularity in the right pelvis is probably due to an unopacified pelvic vein. 3. Multiple pulmonary nodules, some cavitary. These are not typical of metastatic adenocarcinoma (especially without evidence of abdominal disease). Consider multicentric or metastatic lung cancer. PET-CT may be helpful to  assess hypermetabolic activity and guided tissue sampling. 4. Multiple bladder calculi consistent with chronic bladder outlet obstruction from prostatomegaly. Nonobstructing right renal calculus. No hydronephrosis. 5. Aortic Atherosclerosis (ICD10-I70.0). Additional incidental findings including a moderate size hiatal hernia and renal cysts. Electronically Signed   By: Richardean Sale M.D.   On: 10/28/2017 15:01   Ct Abdomen Pelvis W Contrast  Result Date: 10/28/2017 CLINICAL DATA:  Rectal cancer diagnosed October 12, 2017.  Staging. EXAM:  CT CHEST, ABDOMEN, AND PELVIS WITH CONTRAST TECHNIQUE: Multidetector CT imaging of the chest, abdomen and pelvis was performed following the standard protocol during bolus administration of intravenous contrast. CONTRAST:  164m ISOVUE-300 IOPAMIDOL (ISOVUE-300) INJECTION 61% COMPARISON:  Chest CT 03/11/2006. FINDINGS: CT CHEST FINDINGS Cardiovascular: Mild atherosclerosis of the aorta, great vessels and coronary arteries. No acute vascular findings are demonstrated. The heart size is normal. There is no pericardial effusion. Mediastinum/Nodes: There are no enlarged mediastinal, hilar or axillary lymph nodes. There is a chronic moderate-size hiatal hernia. The thyroid gland and trachea demonstrate no significant findings. Lungs/Pleura: There is no pleural effusion. Mild emphysema noted. There are several new pulmonary nodules bilaterally. In the right lower lobe, there is a slightly lobulated 2.0 x 1.8 cm nodule on image 96. There is a 5 mm right lower lobe nodule on image 75 and a 10 x 14 mm right middle lobe nodule on image 97. In the left upper lobe, there is a centrally cavitary nodule posteriorly measuring 18 x 13 mm on image 40. There is an additional 7 mm cavitary lesion along the left major fissure on image 67. Musculoskeletal/Chest wall: No chest wall mass or suspicious osseous findings. CT ABDOMEN AND PELVIS FINDINGS Hepatobiliary: The liver is normal in density  without focal abnormality. No evidence of gallstones, gallbladder wall thickening or biliary dilatation. Pancreas: Unremarkable. No pancreatic ductal dilatation or surrounding inflammatory changes. Spleen: Normal in size without focal abnormality. Adrenals/Urinary Tract: Mild nodularity of the left adrenal gland is similar to the prior study. The right adrenal gland appears normal. In the upper pole of the right kidney, there is a nonobstructing 9 mm calculus. No other renal or ureteral calculi are demonstrated. However, there are 3 large bladder calculi, measuring up to 22 mm in diameter. There are multiple low-density renal lesions, measuring up to 6.5 cm posteriorly in the mid right kidney. There are thin calcifications within the wall of this cyst. Those large enough to characterize are consistent with cysts. Some are too small to optimally characterize, although no suspicious renal lesions are seen. Aside from the bladder calculi, the bladder appears unremarkable. Stomach/Bowel: As above, there is a moderate size hiatal hernia which appears chronic. The small bowel and proximal colon appear normal. The appendix appears normal. There is circumferential wall thickening extending from the rectosigmoid junction into the upper rectum (axial images 91-99), spanning approximately 6.8 cm in length on sagittal image 111. No evidence of bowel obstruction or perforation. Vascular/Lymphatic: No definite enlarged abdominopelvic lymph nodes. A 9 x 16 mm nodule in the right pelvis on image 93 is likely venous. There is aortic and branch vessel atherosclerosis. No acute vascular findings. Reproductive: The prostate gland is enlarged, measuring 6.2 x 4.8 cm transverse. It demonstrates heterogeneity and probable median lobe hypertrophy extending into the bladder base. The seminal vesicles appear normal. Other: No evidence of abdominal wall mass or hernia. No ascites. Musculoskeletal: No acute or significant osseous findings.  Mild lumbar spondylosis noted. IMPRESSION: 1. Long segment circumferential wall thickening of the rectosigmoid colon consistent with known adenocarcinoma. No evidence of bowel obstruction or perforation. 2. No definite signs of metastatic disease in the abdomen or pelvis. Soft tissue nodularity in the right pelvis is probably due to an unopacified pelvic vein. 3. Multiple pulmonary nodules, some cavitary. These are not typical of metastatic adenocarcinoma (especially without evidence of abdominal disease). Consider multicentric or metastatic lung cancer. PET-CT may be helpful to assess hypermetabolic activity and guided tissue sampling. 4. Multiple bladder  calculi consistent with chronic bladder outlet obstruction from prostatomegaly. Nonobstructing right renal calculus. No hydronephrosis. 5. Aortic Atherosclerosis (ICD10-I70.0). Additional incidental findings including a moderate size hiatal hernia and renal cysts. Electronically Signed   By: Richardean Sale M.D.   On: 10/28/2017 15:01    ASSESSMENT & PLAN:  Adrian Banks is a 83 y.o. male without a significant past medical history, presented with iron deficient anemia.  He underwent a colonoscopy which showed a non-obstructive mass from proximal rectum to mid rectum.   1. Rectal Cancer, invasive adenocarcinoma, TxNxMx -I reviewed his colonoscopy and biopsy pathology results in great details with patient  -We discussed his CT Scan from 10/28/17 which shows multiple lung nodules, largest 2cm in RLL, which is very concerning for metastasis. I recommend a lung biopsy to rule out possible lung primary and to determine staging. I explained the risks of lung biopsy. After a lengthy discussion, he states he values his quality of life much more than quantity of his life, and would like to wait on his lung biopsy.  -I suggest a PET scan to get a better view of his disease. He agreed.  -We discussed like he most likely has metastatic disease, the goal of care  would then be palliative to control disease and try to delay symptoms.  He has mild symptoms from his rectal primary, and this is likely getting worse if untreated, especially bowel obstruction.  Given his advanced age, I recommend him to get his primary rectal tumor first.  Also he is not a candidate for intensive chemotherapy, I think he would likely be able to tolerate concurrent Xeloda, for better local control and then show some benefit for his metastatic disease. -I would consider systemic chemo with FOLFOX or Dicie Beam after he completes his radiation.  Patient was quite overwhelmed by the CT scan findings today. I plan to discuss more about his systemic chemotherapy after he completes radiation. - I also discussed the possibility of Lung SBRT after rectal cancer radiation and chemo -I disused his high copay for Xeloda. He reports he is willing to pay if he needs to. He would like to apply for financial assistance. He will proceed with radiation on 11/05/17 even if he cannot obtain his Xeloda this week or if he cannot tolerate it. I again reviewed the side effects of Xeloda. He agreed to proceed with chemoradiation.  -I suggest he takes stool softener if he feels his BMs become harder or less regular.  -Labs show his Hg is 7.3, he has dyspnea on exertion, so I recommend a blood transfusion in the next few days.  -F/u next week    2. Anemia, iron deficient  -10/22/17 labs showed anemia with hemoglobin 8.0, MCV 64.9, normal WBC and platelet, this is likely iron deficient anemia, secondary to his rectal cancer bleeding. -He received IV Feraheme 11/26 and again on 11/30.  -His 11/02/17 Hg was 7.3, I suggest he needs a blood transfusion in the next few days. He agreed.  -He has received first dose of Feraheme today, and will receive second dose next week  3. Lung masses  -Likely metastatic disease from his rectal cancer, also primary lung cancer with metastasis is also possible. -Patient declined  lung biopsy now -PET scan for further evaluation  4. Goal of care discussion  -We again discussed the incurable nature of his cancer, and the overall poor prognosis, especially if he does not have good response to radiation, chemotherapy or progress on chemo -The patient  understands the goal of care is palliative. -I recommend DNR/DNI, he will think about it    PLAN: -PET in 1-2 weeks  -Lab and f/u in 1 weeks  -Blood transfusion in 1-3 days  -Lab today for type and cross  -He will start radiation and concurrent xeloda 1568m bid M-F on November 04, 2017.    Orders Placed This Encounter  Procedures  . NM PET Image Initial (PI) Skull Base To Thigh    Standing Status:   Future    Standing Expiration Date:   11/02/2018    Order Specific Question:   If indicated for the ordered procedure, I authorize the administration of a radiopharmaceutical per Radiology protocol    Answer:   Yes    Order Specific Question:   [facility]    [facility]   Order Specific Question:   Radiology Contrast Protocol - do NOT remove file path    Answer:   [email or web address]  . Type and screen    Standing Status:   Future    Number of Occurrences:   1    Standing Expiration Date:   11/02/2018    All questions were answered. The patient knows to call the clinic with any problems, questions or concerns.  I spent 35 minutes counseling the patient face to face. The total time spent in the appointment was 40 minutes and more than 50% was on counseling.     FTruitt Merle MD 11/02/2017   This document serves as a record of services personally performed by YTruitt Merle MD. It was created on her behalf by AJoslyn Devon a trained medical scribe. The creation of this record is based on the scribe's personal observations and the provider's statements to them.    I have reviewed the above documentation for accuracy and completeness, and I agree with the  above.

## 2017-11-02 NOTE — Telephone Encounter (Signed)
Printed avs and calender for upcoming apointment. Per 11/26 los

## 2017-11-03 ENCOUNTER — Ambulatory Visit (HOSPITAL_COMMUNITY)
Admission: RE | Admit: 2017-11-03 | Discharge: 2017-11-03 | Disposition: A | Discharge status: Home / Self Care | Payer: Medicare Other | Source: Ambulatory Visit | Attending: Hematology | Admitting: Hematology

## 2017-11-03 DIAGNOSIS — D500 Iron deficiency anemia secondary to blood loss (chronic): Secondary | ICD-10-CM | POA: Diagnosis not present

## 2017-11-03 DIAGNOSIS — Z510 Encounter for antineoplastic radiation therapy: Secondary | ICD-10-CM | POA: Diagnosis not present

## 2017-11-03 MED ORDER — SODIUM CHLORIDE 0.9 % IV SOLN: 250.0000 mL | Freq: Once | INTRAVENOUS | Status: DC

## 2017-11-03 NOTE — Discharge Instructions (Signed)

## 2017-11-04 LAB — TYPE AND SCREEN
ABO/RH(D): A POS
Antibody Screen: NEGATIVE
UNIT DIVISION: 0
UNIT DIVISION: 0

## 2017-11-04 LAB — BPAM RBC
Blood Product Expiration Date: 201812142359
Blood Product Expiration Date: 201812152359
ISSUE DATE / TIME: 201811271057
ISSUE DATE / TIME: 201811271057
UNIT TYPE AND RH: 6200
Unit Type and Rh: 6200

## 2017-11-05 ENCOUNTER — Ambulatory Visit
Admission: RE | Admit: 2017-11-05 | Discharge: 2017-11-05 | Disposition: A | Discharge status: Home / Self Care | Payer: Medicare Other | Source: Ambulatory Visit | Attending: Radiation Oncology | Admitting: Radiation Oncology

## 2017-11-05 ENCOUNTER — Other Ambulatory Visit: Payer: Medicare Other

## 2017-11-05 ENCOUNTER — Other Ambulatory Visit: Payer: Self-pay | Admitting: General Surgery

## 2017-11-05 DIAGNOSIS — C20 Malignant neoplasm of rectum: Secondary | ICD-10-CM

## 2017-11-05 DIAGNOSIS — Z510 Encounter for antineoplastic radiation therapy: Secondary | ICD-10-CM | POA: Diagnosis not present

## 2017-11-05 NOTE — Progress Notes (Signed)
patient  Education done, my business card, radiation therapy and you book given,see MD weekly and prn, discussed skin irritating, pain, fatigue, loss appetite, nausea,vomiting,diarrhea, bbay wipes without alcohol, spray bottle, luke warm shower or  bath,no rubbing,scrubbing or scratching affected area,pat dry, get exercise daily, enough rest and sleep, if having diarrhea, imodium over the counter, low fiber diet, may need to eat smaller meals 5-6 with snacks , no fresh vegs or fresh fruit with diarrhea,  Increase protein in diet, drink plenty water , may get urgency,frequency of bladder, bladder spasms,dysuria also, teachback given 2:21 PM

## 2017-11-06 ENCOUNTER — Ambulatory Visit
Admission: RE | Admit: 2017-11-06 | Discharge: 2017-11-06 | Disposition: A | Discharge status: Home / Self Care | Payer: Medicare Other | Source: Ambulatory Visit | Attending: Radiation Oncology | Admitting: Radiation Oncology

## 2017-11-06 DIAGNOSIS — Z510 Encounter for antineoplastic radiation therapy: Secondary | ICD-10-CM | POA: Diagnosis not present

## 2017-11-09 ENCOUNTER — Ambulatory Visit (HOSPITAL_BASED_OUTPATIENT_CLINIC_OR_DEPARTMENT_OTHER): Payer: Medicare Other | Admitting: Nurse Practitioner

## 2017-11-09 ENCOUNTER — Ambulatory Visit
Admission: RE | Admit: 2017-11-09 | Discharge: 2017-11-09 | Disposition: A | Discharge status: Home / Self Care | Payer: Medicare Other | Source: Ambulatory Visit | Attending: Radiation Oncology | Admitting: Radiation Oncology

## 2017-11-09 ENCOUNTER — Other Ambulatory Visit (HOSPITAL_BASED_OUTPATIENT_CLINIC_OR_DEPARTMENT_OTHER): Payer: Medicare Other

## 2017-11-09 ENCOUNTER — Ambulatory Visit (HOSPITAL_BASED_OUTPATIENT_CLINIC_OR_DEPARTMENT_OTHER): Payer: Medicare Other

## 2017-11-09 ENCOUNTER — Encounter: Payer: Self-pay | Admitting: Nurse Practitioner

## 2017-11-09 VITALS — BP 149/99 | HR 87 | Temp 97.1°F | Resp 18 | Ht 70.0 in | Wt 164.8 lb

## 2017-11-09 VITALS — BP 147/73 | HR 65 | Temp 98.1°F | Resp 18

## 2017-11-09 DIAGNOSIS — C20 Malignant neoplasm of rectum: Secondary | ICD-10-CM

## 2017-11-09 DIAGNOSIS — D509 Iron deficiency anemia, unspecified: Secondary | ICD-10-CM

## 2017-11-09 DIAGNOSIS — Z510 Encounter for antineoplastic radiation therapy: Secondary | ICD-10-CM | POA: Diagnosis not present

## 2017-11-09 DIAGNOSIS — D500 Iron deficiency anemia secondary to blood loss (chronic): Secondary | ICD-10-CM

## 2017-11-09 DIAGNOSIS — R918 Other nonspecific abnormal finding of lung field: Secondary | ICD-10-CM | POA: Diagnosis not present

## 2017-11-09 LAB — CBC WITH DIFFERENTIAL/PLATELET
BASO%: 1 % (ref 0.0–2.0)
Basophils Absolute: 0.1 10*3/uL (ref 0.0–0.1)
EOS ABS: 0.1 10*3/uL (ref 0.0–0.5)
EOS%: 1.2 % (ref 0.0–7.0)
HEMATOCRIT: 37.4 % — AB (ref 38.4–49.9)
HGB: 11.7 g/dL — ABNORMAL LOW (ref 13.0–17.1)
LYMPH#: 1.3 10*3/uL (ref 0.9–3.3)
LYMPH%: 17.3 % (ref 14.0–49.0)
MCH: 22.5 pg — ABNORMAL LOW (ref 27.2–33.4)
MCHC: 31.3 g/dL — AB (ref 32.0–36.0)
MCV: 72 fL — AB (ref 79.3–98.0)
MONO#: 0.5 10*3/uL (ref 0.1–0.9)
MONO%: 6.7 % (ref 0.0–14.0)
NEUT%: 73.8 % (ref 39.0–75.0)
NEUTROS ABS: 5.6 10*3/uL (ref 1.5–6.5)
PLATELETS: 241 10*3/uL (ref 140–400)
RBC: 5.2 10*6/uL (ref 4.20–5.82)
RDW: 31.1 % — ABNORMAL HIGH (ref 11.0–14.6)
WBC: 7.6 10*3/uL (ref 4.0–10.3)

## 2017-11-09 LAB — COMPREHENSIVE METABOLIC PANEL
ALK PHOS: 70 U/L (ref 40–150)
ALT: 13 U/L (ref 0–55)
AST: 18 U/L (ref 5–34)
Albumin: 3.6 g/dL (ref 3.5–5.0)
Anion Gap: 8 mEq/L (ref 3–11)
BUN: 17.8 mg/dL (ref 7.0–26.0)
CHLORIDE: 110 meq/L — AB (ref 98–109)
CO2: 22 mEq/L (ref 22–29)
Calcium: 9.1 mg/dL (ref 8.4–10.4)
Creatinine: 1.3 mg/dL (ref 0.7–1.3)
GLUCOSE: 94 mg/dL (ref 70–140)
POTASSIUM: 3.5 meq/L (ref 3.5–5.1)
SODIUM: 140 meq/L (ref 136–145)
Total Bilirubin: 0.67 mg/dL (ref 0.20–1.20)
Total Protein: 6.9 g/dL (ref 6.4–8.3)

## 2017-11-09 LAB — CEA (IN HOUSE-CHCC): CEA (CHCC-In House): 8.7 ng/mL — ABNORMAL HIGH (ref 0.00–5.00)

## 2017-11-09 LAB — TECHNOLOGIST REVIEW

## 2017-11-09 MED ORDER — SODIUM CHLORIDE 0.9 % IV SOLN
Freq: Once | INTRAVENOUS | Status: AC
  Administered 2017-11-09: 15:00:00 via INTRAVENOUS

## 2017-11-09 MED ORDER — FERUMOXYTOL INJECTION 510 MG/17 ML
510.0000 mg | Freq: Once | INTRAVENOUS | Status: AC
  Administered 2017-11-09: 510 mg via INTRAVENOUS
  Filled 2017-11-09: qty 17

## 2017-11-09 NOTE — Progress Notes (Signed)
  Oncology Nurse Navigator Documentation  Navigator Location: [facility] (11/09/17 1529)   )Navigator Encounter Type: Treatment (11/09/17 1529)                   Treatment Initiated Date: 11/05/17 (11/09/17 1529)   Treatment Phase: First Chemo Tx (11/09/17 1529)     Interventions: Psycho-social support (11/09/17 1529)    Met with patient in the infusion room during his first chemotherapy treatment. Patient encouraged to call me with any questions or concerns. No barriers to tx noted.        Acuity: Level 2 (11/09/17 1529)   Acuity Level 2: Ongoing guidance and education throughout treatment as needed (11/09/17 1529)     Time Spent with Patient: 15 (11/09/17 1529)

## 2017-11-09 NOTE — Progress Notes (Signed)
[place]  [phone] Fax:([phone]) [phone]  Clinic Follow up Note   [name] 11/09/2017  CHIEF COMPLAINT: F/u rectal cancer   SUMMARY OF ONCOLOGIC HISTORY:   Rectal cancer ([place])   09/29/2017 Procedure    Colonoscopy by Dr. Penelope Coop 09/29/17  Findings:  The perianal and digital rectal examination were normal.  A non-obstructing large mass was found in the proximal rectum and in the mid rectum.  The mass was circumferential.  This was biopsied with a cold forceps for histology.  Area was tattooed with an injection of [place] ink both distal and proximal to the mass.  There is about 4-5 cm of rectal mucosa distal to the mass that looks norma. This mass is about 7-8 cm in length.  A 10 mm polyp was found in the ascending colon. The polyp was sessile. the polyp was removed with a hot snare.  Resection and retrieval were complete.        09/29/2017 Initial Biopsy    FINAL DIAGNOSIS 09/29/17  1. LG Intestine - Ascending Colon Polyp:  TUBULAR ADENOMA 2. LG Intestine - Rectum, Bx: INVASIVE MODERATELY DIFFERENTIATED ADENOCARCINOMA. THE MSI TESTING IS PENDING AND THE RESULT WILL BE REPORTED AS AN ADDENDUM.      10/21/2017 Initial Diagnosis    Rectal cancer ([place])      10/28/2017 Imaging    CT CAP W Contrast  IMPRESSION: 1. Long segment circumferential wall thickening of the rectosigmoid colon consistent with known adenocarcinoma. No evidence of bowel obstruction or perforation. 2. No definite signs of metastatic disease in the abdomen or pelvis. Soft tissue nodularity in the right pelvis is probably due to an unopacified pelvic vein. 3. Multiple pulmonary nodules, some cavitary. These are not typical of metastatic adenocarcinoma (especially without evidence of abdominal disease). Consider multicentric or metastatic lung cancer. PET-CT may be helpful to assess hypermetabolic activity  and guided tissue sampling. 4. Multiple bladder calculi consistent with chronic bladder outlet obstruction from prostatomegaly. Nonobstructing right renal calculus. No hydronephrosis. 5. Aortic Atherosclerosis (ICD10-I70.0). Additional incidental findings including a moderate size hiatal hernia and renal cysts.        Radiation Therapy    Radiation with Dr. Lisbeth Renshaw With Xeloda, plan to start 11/05/17       Chemotherapy    Chemoradiation with Xeloda 821m/m2 q12h, plan to start 11/05/17     CURRENT THERAPY: Chemoradiation with Xeloda 15025m( 82534m2) q12h start 11/05/17; patient omitted xeloda 11/29 and 11/30, will start with PM dose 11/09/17. IV Feraheme 09/02/17, 11/09/17  INTERVAL HISTORY: Adrian Banks for f/u as scheduled. He began rectal radiation on 11/29; he has not started xeloda yet, has hesitations regarding side effects. He has supply and has discussed dosing instructions with pharmacy. He is tolerating radiation well so far. Denies rectal pain, bleeding, or change in BM. He otherwise has not complaints today.  REVIEW OF SYSTEMS:   Constitutional: Denies fatigue, fevers, chills or abnormal weight loss Eyes: Denies blurriness of vision Ears, nose, mouth, throat, and face: Denies mucositis or sore throat Respiratory: Denies cough or wheezes (+) DOE, stable  Cardiovascular: Denies palpitation, chest discomfort or lower extremity swelling Gastrointestinal:  Denies nausea, heartburn or change in bowel habits, rectal bleeding, or rectal pain Skin: Denies abnormal skin rashes Lymphatics: Denies new lymphadenopathy or easy bruising Neurological:Denies numbness, tingling or new weaknesses Behavioral/Psych: Mood is stable, no new changes  All other systems were reviewed with the patient  and are negative.  MEDICAL HISTORY:  Past Medical History:  Diagnosis Date  . Cancer ([place]) 09/29/2017   Rectal cvancer    SURGICAL HISTORY: No past surgical history on file.  I have  reviewed the social history and family history with the patient and they are unchanged from previous note.  ALLERGIES:  has No Known Allergies.  MEDICATIONS:  Current Outpatient Medications  Medication Sig Dispense Refill  . finasteride (PROSCAR) 5 MG tablet Take 5 mg by mouth.    . lovastatin (MEVACOR) 40 MG tablet Take 40 mg by mouth.    . capecitabine (XELODA) 500 MG tablet Take 3 tablets (15101m) by mouth 2 times daily, within 30 minutes of food, on radiation days only, M-F 180 tablet 0   No current facility-administered medications for this visit.     PHYSICAL EXAMINATION: ECOG PERFORMANCE STATUS: 1 - Symptomatic but completely ambulatory  Vitals:   11/09/17 1338  BP: (!) 149/99  Pulse: 87  Resp: 18  Temp: (!) 97.1 F (36.2 C)  SpO2: 100%   Filed Weights   11/09/17 1338  Weight: 164 lb 12.8 oz (74.8 kg)    GENERAL:alert, no distress and comfortable SKIN: skin color, texture, turgor are normal, no rashes or significant lesions EYES: normal, Conjunctiva are pink and non-injected, sclera clear OROPHARYNX:no exudate, no erythema and lips, buccal mucosa, and tongue normal  NECK: supple, thyroid normal size, non-tender, without nodularity LYMPH:  no palpable cervical, supraclavicular, or axillary lymphadenopathy LUNGS: clear to auscultation and percussion with normal breathing effort HEART: regular rate & rhythm and no murmurs and no lower extremity edema ABDOMEN:abdomen soft, non-tender and normal bowel sounds Musculoskeletal:no cyanosis of digits and no clubbing  NEURO: alert & oriented x 3 with fluent speech, no focal motor/sensory deficits  LABORATORY DATA:  I have reviewed the data as listed CBC Latest Ref Rng & Units 11/09/2017 11/02/2017 10/22/2017  WBC 4.0 - 10.3 10e3/uL 7.6 8.5 10.2  Hemoglobin 13.0 - 17.1 g/dL 11.7(L) 7.3(L) 8.0(L)  Hematocrit 38.4 - 49.9 % 37.4(L) 25.5(L) 27.6(L)  Platelets 140 - 400 10e3/uL 241 306 307     CMP Latest Ref Rng & Units  11/09/2017 11/02/2017 10/22/2017  Glucose 70 - 140 mg/dl 94 100 93  BUN 7.0 - 26.0 mg/dL 17.8 19.6 20.0  Creatinine 0.7 - 1.3 mg/dL 1.3 1.2 1.3  Sodium 136 - 145 mEq/L 140 141 142  Potassium 3.5 - 5.1 mEq/L 3.5 3.7 4.1  CO2 22 - 29 mEq/L 22 22 23   Calcium 8.4 - 10.4 mg/dL 9.1 8.9 9.3  Total Protein 6.4 - 8.3 g/dL 6.9 6.6 7.0  Total Bilirubin 0.20 - 1.20 mg/dL 0.67 0.47 0.49  Alkaline Phos 40 - 150 U/L 70 63 66  AST 5 - 34 U/L 18 16 17   ALT 0 - 55 U/L 13 12 12     RADIOGRAPHIC STUDIES: I have personally reviewed the radiological images as listed and agreed with the findings in the report. No results found.   ASSESSMENT & PLAN: CTOMMEY BARRETis a 83y.o. male without a significant past medical history, presented with iron deficient anemia.  He underwent a colonoscopy which showed a non-obstructive mass from proximal rectum to mid rectum.   1. Rectal Cancer, invasive adenocarcinoma, TxNxMx 2. Anemia, iron deficient 3. Lung masses 4. Goals of care discussion  Mr. DSloneappears stable today. He is tolerating radiation well so far. We reviewed xeloda dosing and possible side effects and management. He has supply and agrees to start  tonight. He will get 2nd dose IV feraheme today, Hgb and indices improved today, now 11.7 up from 7.3 one week ago. Cmet unremarkable. CEA unchanged, 8.7. Physical exam unremarkable. He will continue chemoradiation with Xeloda, 1500 mg BID Monday - Friday with radiation. PET scan to evaluate multiple lung nodules on 11/11/17, we reviewed NPO and time/date. Will f/u on results. We will see him weekly with labs while under treatment.   PLAN: Continue chemoradiation with Xeloda 1500 mg BID Monday - Friday with radiation, patient agrees to begin Xeloda tonight PET 11/11/17 Labs and f/u weekly while under treatment  All questions were answered. The patient knows to call the clinic with any problems, questions or concerns. No barriers to learning was detected.      Alla Feeling, NP 11/09/17

## 2017-11-09 NOTE — Patient Instructions (Signed)

## 2017-11-10 ENCOUNTER — Ambulatory Visit
Admission: RE | Admit: 2017-11-10 | Discharge: 2017-11-10 | Disposition: A | Discharge status: Home / Self Care | Payer: Medicare Other | Source: Ambulatory Visit | Attending: Radiation Oncology | Admitting: Radiation Oncology

## 2017-11-10 DIAGNOSIS — Z510 Encounter for antineoplastic radiation therapy: Secondary | ICD-10-CM | POA: Diagnosis not present

## 2017-11-11 ENCOUNTER — Ambulatory Visit
Admission: RE | Admit: 2017-11-11 | Discharge: 2017-11-11 | Disposition: A | Discharge status: Home / Self Care | Payer: Medicare Other | Source: Ambulatory Visit | Attending: Radiation Oncology | Admitting: Radiation Oncology

## 2017-11-11 ENCOUNTER — Encounter (HOSPITAL_COMMUNITY)
Admission: RE | Admit: 2017-11-11 | Discharge: 2017-11-11 | Disposition: A | Discharge status: Home / Self Care | Payer: Medicare Other | Source: Ambulatory Visit | Attending: Hematology | Admitting: Hematology

## 2017-11-11 DIAGNOSIS — R911 Solitary pulmonary nodule: Secondary | ICD-10-CM | POA: Insufficient documentation

## 2017-11-11 DIAGNOSIS — C20 Malignant neoplasm of rectum: Secondary | ICD-10-CM | POA: Diagnosis present

## 2017-11-11 DIAGNOSIS — Z510 Encounter for antineoplastic radiation therapy: Secondary | ICD-10-CM | POA: Diagnosis not present

## 2017-11-11 LAB — GLUCOSE, CAPILLARY: Glucose-Capillary: 82 mg/dL (ref 65–99)

## 2017-11-11 MED ORDER — FLUDEOXYGLUCOSE F - 18 (FDG) INJECTION
8.1100 | Freq: Once | INTRAVENOUS | Status: AC | PRN
  Administered 2017-11-11: 8.11 via INTRAVENOUS

## 2017-11-12 ENCOUNTER — Ambulatory Visit
Admission: RE | Admit: 2017-11-12 | Discharge: 2017-11-12 | Disposition: A | Discharge status: Home / Self Care | Payer: Medicare Other | Source: Ambulatory Visit | Attending: Radiation Oncology | Admitting: Radiation Oncology

## 2017-11-12 DIAGNOSIS — Z510 Encounter for antineoplastic radiation therapy: Secondary | ICD-10-CM | POA: Diagnosis not present

## 2017-11-13 ENCOUNTER — Ambulatory Visit
Admission: RE | Admit: 2017-11-13 | Discharge: 2017-11-13 | Disposition: A | Discharge status: Home / Self Care | Payer: Medicare Other | Source: Ambulatory Visit | Attending: Radiation Oncology | Admitting: Radiation Oncology

## 2017-11-13 DIAGNOSIS — Z510 Encounter for antineoplastic radiation therapy: Secondary | ICD-10-CM | POA: Diagnosis not present

## 2017-11-16 ENCOUNTER — Ambulatory Visit: Payer: Medicare Other | Admitting: Hematology

## 2017-11-16 ENCOUNTER — Ambulatory Visit: Payer: Medicare Other

## 2017-11-16 ENCOUNTER — Other Ambulatory Visit: Payer: Medicare Other

## 2017-11-17 ENCOUNTER — Ambulatory Visit: Payer: Medicare Other

## 2017-11-17 ENCOUNTER — Telehealth: Payer: Self-pay | Admitting: Hematology

## 2017-11-17 NOTE — Telephone Encounter (Signed)
S/w pt advised appt 12/17 @ 8.30. He will collect a calendar tomorrow.

## 2017-11-18 ENCOUNTER — Ambulatory Visit
Admission: RE | Admit: 2017-11-18 | Discharge: 2017-11-18 | Disposition: A | Discharge status: Home / Self Care | Payer: Medicare Other | Source: Ambulatory Visit | Attending: Radiation Oncology | Admitting: Radiation Oncology

## 2017-11-18 ENCOUNTER — Telehealth: Payer: Self-pay | Admitting: Hematology

## 2017-11-18 DIAGNOSIS — Z510 Encounter for antineoplastic radiation therapy: Secondary | ICD-10-CM | POA: Diagnosis not present

## 2017-11-18 NOTE — Progress Notes (Signed)
[place]  Telephone:(336) [phone] Fax:(336) [phone]  Clinic Follow Up Note   [name]   Date of Service: 11/19/2017   CHIEF COMPLAINTS:  Follow up rectal Cancer    Rectal cancer ([place])   09/29/2017 Procedure    Colonoscopy by Dr. Penelope Coop 09/29/17  Findings:  The perianal and digital rectal examination were normal.  A non-obstructing large mass was found in the proximal rectum and in the mid rectum.  The mass was circumferential.  This was biopsied with a cold forceps for histology.  Area was tattooed with an injection of Niger ink both distal and proximal to the mass.  There is about 4-5 cm of rectal mucosa distal to the mass that looks norma. This mass is about 7-8 cm in length.  A 10 mm polyp was found in the ascending colon. The polyp was sessile. the polyp was removed with a hot snare.  Resection and retrieval were complete.        09/29/2017 Initial Biopsy    FINAL DIAGNOSIS 09/29/17  1. LG Intestine - Ascending Colon Polyp:  TUBULAR ADENOMA 2. LG Intestine - Rectum, Bx: INVASIVE MODERATELY DIFFERENTIATED ADENOCARCINOMA. THE MSI TESTING IS PENDING AND THE RESULT WILL BE REPORTED AS AN ADDENDUM.      10/21/2017 Initial Diagnosis    Rectal cancer ([place])      10/28/2017 Imaging    CT CAP W Contrast  IMPRESSION: 1. Long segment circumferential wall thickening of the rectosigmoid colon consistent with known adenocarcinoma. No evidence of bowel obstruction or perforation. 2. No definite signs of metastatic disease in the abdomen or pelvis. Soft tissue nodularity in the right pelvis is probably due to an unopacified pelvic vein. 3. Multiple pulmonary nodules, some cavitary. These are not typical of metastatic adenocarcinoma (especially without evidence of abdominal disease). Consider multicentric or metastatic lung cancer. PET-CT may be helpful to assess hypermetabolic activity  and guided tissue sampling. 4. Multiple bladder calculi consistent with chronic bladder outlet obstruction from prostatomegaly. Nonobstructing right renal calculus. No hydronephrosis. 5. Aortic Atherosclerosis (ICD10-I70.0). Additional incidental findings including a moderate size hiatal hernia and renal cysts.       11/05/2017 -  Radiation Therapy    Radiation with Dr. Lisbeth Renshaw With Xeloda, plan to start 11/05/17      11/09/2017 -  Chemotherapy    Chemoradiation with Xeloda 810m/m2 q12h, M-F on days of radiation plan to starting 11/09/17      11/11/2017 Imaging    PET Scan  IMPRESSION:  Wall thickening/ mass involving the upper rectum, corresponding to the patient's known rectal adenocarcinoma. Bilateral pulmonary nodules, partially cavitary in the left upper lobe nodule, suspicious for metastases. Notably, cavitary metastases are more common with squamous cell carcinoma rather than the patient's known adenocarcinoma.         HISTORY OF PRESENTING ILLNESS: 10/22/17  [name] Comment83y.o. male is here because of newly diagnosed rectal cancer. He was referred by Dr. TMarcello Moores He presents to the clinic today by himself.   Pt is a poor historian. He denies any significant abdominal discomfort, nausea, bloating, rectal bleeding or change in bowel habits.  He was referred to GI doctor Ganem for colonoscopy by his PCP due to deficient anemia, which showed a nonobstructing mass in the proximal rectum to mid rectum, biopsy showed invasive adenocarcinoma.  He was referred to colorectal surgeon Dr. TMarcello Moores who recommended surgical resection.  [place]arcello Moores[place] patient [place]  and to Dr. Lisbeth Renshaw for evaluation of new adjuvant chemoradiation.  Marcello Moores has ordered staging CT chest, abdomen and pelvis, and pelvic MRI for staging these scans has not been scheduled yet.   He has had some dysuria in the past few months, was seen by his PCP and diagnosed with BPH. He started Proscar.  He is married, lives with  his wife, daughter independent and functions well.   CURRENT THERAPY: Radiation starting 11/05/17 and with Xeloda '1500mg'$  ( '825mg'$ /m2) q12h M-F on days of radiation starting 11/09/17   INTERVAL HISTORY:  KENNEN Banks is here for a follow up. He presents in the clinic today noting this is his 2nd week fo chemo and radiation. He notes no changes since starting. He still has been having loose stool. Use of imodium has relieved this some. He denies blood in stool. He thinks his energy is moderately better. He notes the xeloda is expensive as he is paying about $400 for 90 day supply. He brought the print out of his schedule and reviewed for his understanding. He is late for radiation today and wonders of he can be worked in.    REVIEW OF SYSTEMS:   Constitutional: Denies fevers, chills or abnormal night sweats  (+) improved energy Eyes: Denies blurriness of vision, double vision or watery eyes Ears, nose, mouth, throat, and face: Denies mucositis or sore throat Respiratory: Denies cough, dyspnea or wheezes (+) occasional SOB Cardiovascular: Denies palpitation, chest discomfort or lower extremity swelling Gastrointestinal:  Denies nausea, heartburn (+) Loose stool, moderately controlled with imodium  Skin: Denies abnormal skin rashes Lymphatics: Denies new lymphadenopathy or easy bruising Neurological:Denies numbness, tingling or new weaknesses Behavioral/Psych: Mood is stable, no new changes  All other systems were reviewed with the patient and are negative.   MEDICAL HISTORY:  Past Medical History:  Diagnosis Date  . Cancer ([place]) 09/29/2017   Rectal cvancer    SURGICAL HISTORY: History reviewed. No pertinent surgical history.  SOCIAL HISTORY: Social History   Socioeconomic History  . Marital status: Married    Spouse name: Not on file  . Number of children: Not on file  . Years of education: Not on file  . Highest education level: Not on file  Social Needs  . Financial  resource strain: Not on file  . Food insecurity - worry: Not on file  . Food insecurity - inability: Not on file  . Transportation needs - medical: Not on file  . Transportation needs - non-medical: Not on file  Occupational History  . Not on file  Tobacco Use  . Smoking status: Former Smoker    Packs/day: 0.50    Years: 10.00    Pack years: 5.00    Last attempt to quit: 1988    Years since quitting: 30.9  . Smokeless tobacco: Never Used  Substance and Sexual Activity  . Alcohol use: Yes    Comment: ocassionally   . Drug use: No  . Sexual activity: Not on file  Other Topics Concern  . Not on file  Social History Narrative  . Not on file    FAMILY HISTORY: Family History  Problem Relation Age of Onset  . Cancer Mother        cancer (unknown type)    ALLERGIES:  has No Known Allergies.  MEDICATIONS:  Current Outpatient Medications  Medication Sig Dispense Refill  . capecitabine (XELODA) 500 MG tablet Take 3 tablets ('1500mg'$ ) by mouth 2 times daily, within 30 minutes of food, on radiation  days only, M-F 180 tablet 0  . finasteride (PROSCAR) 5 MG tablet Take 5 mg by mouth.    . lovastatin (MEVACOR) 40 MG tablet Take 40 mg by mouth.     No current facility-administered medications for this visit.    PHYSICAL EXAMINATION: ECOG PERFORMANCE STATUS: 1 Vitals:   11/19/17 1034  BP: (!) 146/84  Pulse: 73  Resp: 20  Temp: 98.7 F (37.1 C)  TempSrc: Oral  SpO2: 99%  Weight: 161 lb 9.6 oz (73.3 kg)  Height: 5' 10"  (1.778 m)    GENERAL:alert, no distress and comfortable SKIN: skin color, texture, turgor are normal, no rashes or significant lesions EYES: normal, conjunctiva are pink and non-injected, sclera clear OROPHARYNX:no exudate, no erythema and lips, buccal mucosa, and tongue normal  NECK: supple, thyroid normal size, non-tender, without nodularity LYMPH:  no palpable lymphadenopathy in the cervical, axillary or inguinal LUNGS: clear to auscultation and  percussion with normal breathing effort HEART: regular rate & rhythm and no murmurs and no lower extremity edema ABDOMEN:abdomen soft, non-tender and normal bowel sounds, rectal exam was deferred today  Musculoskeletal:no cyanosis of digits and no clubbing  PSYCH: alert & oriented x 3 with fluent speech NEURO: no focal motor/sensory deficits  LABORATORY DATA:  I have reviewed the data as listed CBC Latest Ref Rng & Units 11/19/2017 11/09/2017 11/02/2017  WBC 4.0 - 10.3 10e3/uL 5.2 7.6 8.5  Hemoglobin 13.0 - 17.1 g/dL 13.0 11.7(L) 7.3(L)  Hematocrit 38.4 - 49.9 % 41.1 37.4(L) 25.5(L)  Platelets 140 - 400 10e3/uL 169 241 306    CMP Latest Ref Rng & Units 11/19/2017 11/09/2017 11/02/2017  Glucose 70 - 140 mg/dl 109 94 100  BUN 7.0 - 26.0 mg/dL 15.4 17.8 19.6  Creatinine 0.7 - 1.3 mg/dL 1.2 1.3 1.2  Sodium 136 - 145 mEq/L 141 140 141  Potassium 3.5 - 5.1 mEq/L 3.7 3.5 3.7  CO2 22 - 29 mEq/L 24 22 22   Calcium 8.4 - 10.4 mg/dL 9.2 9.1 8.9  Total Protein 6.4 - 8.3 g/dL 6.7 6.9 6.6  Total Bilirubin 0.20 - 1.20 mg/dL 0.61 0.67 0.47  Alkaline Phos 40 - 150 U/L 61 70 63  AST 5 - 34 U/L 20 18 16   ALT 0 - 55 U/L 19 13 12     PATHOLOGY   FINAL DIAGNOSIS 09/29/17  1. LG Intestine - Ascending Colon Polyp:  TUBULAR ADENOMA 2. LG Intestine - Rectum, Bx: INVASIVE MODERATELY DIFFERENTIATED ADENOCARCINOMA. THE MSI TESTING IS PENDING AND THE RESULT WILL BE REPORTED AS AN ADDENDUM.    PROCEDURES   Colonoscopy by Dr. Penelope Coop 09/29/17  Findings:  The perianal and digital rectal examination were normal.  A non-obstructing large mass was found in the proximal rectum and in the mid rectum.  The mass was circumferential.  This was biopsied with a cold forceps for histology.  Area was tattooed with an injection of [place] ink both distal and proximal to the mass.  There is about 4-5 cm of rectal mucosa distal to the mass that looks norma. This mass is about 7-8 cm in length.  A 10 mm polyp was found in  the ascending colon. The polyp was sessile. the polyp was removed with a hot snare.  Resection and retrieval were complete.     RADIOGRAPHIC STUDIES: I have personally reviewed the radiological images as listed and agreed with the findings in the report. Ct Chest W Contrast  Result Date: 10/28/2017 CLINICAL DATA:  Rectal cancer diagnosed October 12, 2017.  Staging. EXAM: CT CHEST, ABDOMEN, AND PELVIS WITH CONTRAST TECHNIQUE: Multidetector CT imaging of the chest, abdomen and pelvis was performed following the standard protocol during bolus administration of intravenous contrast. CONTRAST:  161m ISOVUE-300 IOPAMIDOL (ISOVUE-300) INJECTION 61% COMPARISON:  Chest CT 03/11/2006. FINDINGS: CT CHEST FINDINGS Cardiovascular: Mild atherosclerosis of the aorta, great vessels and coronary arteries. No acute vascular findings are demonstrated. The heart size is normal. There is no pericardial effusion. Mediastinum/Nodes: There are no enlarged mediastinal, hilar or axillary lymph nodes. There is a chronic moderate-size hiatal hernia. The thyroid gland and trachea demonstrate no significant findings. Lungs/Pleura: There is no pleural effusion. Mild emphysema noted. There are several new pulmonary nodules bilaterally. In the right lower lobe, there is a slightly lobulated 2.0 x 1.8 cm nodule on image 96. There is a 5 mm right lower lobe nodule on image 75 and a 10 x 14 mm right middle lobe nodule on image 97. In the left upper lobe, there is a centrally cavitary nodule posteriorly measuring 18 x 13 mm on image 40. There is an additional 7 mm cavitary lesion along the left major fissure on image 67. Musculoskeletal/Chest wall: No chest wall mass or suspicious osseous findings. CT ABDOMEN AND PELVIS FINDINGS Hepatobiliary: The liver is normal in density without focal abnormality. No evidence of gallstones, gallbladder wall thickening or biliary dilatation. Pancreas: Unremarkable. No pancreatic ductal dilatation or  surrounding inflammatory changes. Spleen: Normal in size without focal abnormality. Adrenals/Urinary Tract: Mild nodularity of the left adrenal gland is similar to the prior study. The right adrenal gland appears normal. In the upper pole of the right kidney, there is a nonobstructing 9 mm calculus. No other renal or ureteral calculi are demonstrated. However, there are 3 large bladder calculi, measuring up to 22 mm in diameter. There are multiple low-density renal lesions, measuring up to 6.5 cm posteriorly in the mid right kidney. There are thin calcifications within the wall of this cyst. Those large enough to characterize are consistent with cysts. Some are too small to optimally characterize, although no suspicious renal lesions are seen. Aside from the bladder calculi, the bladder appears unremarkable. Stomach/Bowel: As above, there is a moderate size hiatal hernia which appears chronic. The small bowel and proximal colon appear normal. The appendix appears normal. There is circumferential wall thickening extending from the rectosigmoid junction into the upper rectum (axial images 91-99), spanning approximately 6.8 cm in length on sagittal image 111. No evidence of bowel obstruction or perforation. Vascular/Lymphatic: No definite enlarged abdominopelvic lymph nodes. A 9 x 16 mm nodule in the right pelvis on image 93 is likely venous. There is aortic and branch vessel atherosclerosis. No acute vascular findings. Reproductive: The prostate gland is enlarged, measuring 6.2 x 4.8 cm transverse. It demonstrates heterogeneity and probable median lobe hypertrophy extending into the bladder base. The seminal vesicles appear normal. Other: No evidence of abdominal wall mass or hernia. No ascites. Musculoskeletal: No acute or significant osseous findings. Mild lumbar spondylosis noted. IMPRESSION: 1. Long segment circumferential wall thickening of the rectosigmoid colon consistent with known adenocarcinoma. No evidence  of bowel obstruction or perforation. 2. No definite signs of metastatic disease in the abdomen or pelvis. Soft tissue nodularity in the right pelvis is probably due to an unopacified pelvic vein. 3. Multiple pulmonary nodules, some cavitary. These are not typical of metastatic adenocarcinoma (especially without evidence of abdominal disease). Consider multicentric or metastatic lung cancer. PET-CT may be helpful to assess hypermetabolic activity and guided tissue sampling. 4.  Multiple bladder calculi consistent with chronic bladder outlet obstruction from prostatomegaly. Nonobstructing right renal calculus. No hydronephrosis. 5. Aortic Atherosclerosis (ICD10-I70.0). Additional incidental findings including a moderate size hiatal hernia and renal cysts. Electronically Signed   By: Richardean Sale M.D.   On: 10/28/2017 15:01   Ct Abdomen Pelvis W Contrast  Result Date: 10/28/2017 CLINICAL DATA:  Rectal cancer diagnosed October 12, 2017.  Staging. EXAM: CT CHEST, ABDOMEN, AND PELVIS WITH CONTRAST TECHNIQUE: Multidetector CT imaging of the chest, abdomen and pelvis was performed following the standard protocol during bolus administration of intravenous contrast. CONTRAST:  134m ISOVUE-300 IOPAMIDOL (ISOVUE-300) INJECTION 61% COMPARISON:  Chest CT 03/11/2006. FINDINGS: CT CHEST FINDINGS Cardiovascular: Mild atherosclerosis of the aorta, great vessels and coronary arteries. No acute vascular findings are demonstrated. The heart size is normal. There is no pericardial effusion. Mediastinum/Nodes: There are no enlarged mediastinal, hilar or axillary lymph nodes. There is a chronic moderate-size hiatal hernia. The thyroid gland and trachea demonstrate no significant findings. Lungs/Pleura: There is no pleural effusion. Mild emphysema noted. There are several new pulmonary nodules bilaterally. In the right lower lobe, there is a slightly lobulated 2.0 x 1.8 cm nodule on image 96. There is a 5 mm right lower lobe nodule  on image 75 and a 10 x 14 mm right middle lobe nodule on image 97. In the left upper lobe, there is a centrally cavitary nodule posteriorly measuring 18 x 13 mm on image 40. There is an additional 7 mm cavitary lesion along the left major fissure on image 67. Musculoskeletal/Chest wall: No chest wall mass or suspicious osseous findings. CT ABDOMEN AND PELVIS FINDINGS Hepatobiliary: The liver is normal in density without focal abnormality. No evidence of gallstones, gallbladder wall thickening or biliary dilatation. Pancreas: Unremarkable. No pancreatic ductal dilatation or surrounding inflammatory changes. Spleen: Normal in size without focal abnormality. Adrenals/Urinary Tract: Mild nodularity of the left adrenal gland is similar to the prior study. The right adrenal gland appears normal. In the upper pole of the right kidney, there is a nonobstructing 9 mm calculus. No other renal or ureteral calculi are demonstrated. However, there are 3 large bladder calculi, measuring up to 22 mm in diameter. There are multiple low-density renal lesions, measuring up to 6.5 cm posteriorly in the mid right kidney. There are thin calcifications within the wall of this cyst. Those large enough to characterize are consistent with cysts. Some are too small to optimally characterize, although no suspicious renal lesions are seen. Aside from the bladder calculi, the bladder appears unremarkable. Stomach/Bowel: As above, there is a moderate size hiatal hernia which appears chronic. The small bowel and proximal colon appear normal. The appendix appears normal. There is circumferential wall thickening extending from the rectosigmoid junction into the upper rectum (axial images 91-99), spanning approximately 6.8 cm in length on sagittal image 111. No evidence of bowel obstruction or perforation. Vascular/Lymphatic: No definite enlarged abdominopelvic lymph nodes. A 9 x 16 mm nodule in the right pelvis on image 93 is likely venous. There  is aortic and branch vessel atherosclerosis. No acute vascular findings. Reproductive: The prostate gland is enlarged, measuring 6.2 x 4.8 cm transverse. It demonstrates heterogeneity and probable median lobe hypertrophy extending into the bladder base. The seminal vesicles appear normal. Other: No evidence of abdominal wall mass or hernia. No ascites. Musculoskeletal: No acute or significant osseous findings. Mild lumbar spondylosis noted. IMPRESSION: 1. Long segment circumferential wall thickening of the rectosigmoid colon consistent with known adenocarcinoma. No evidence of bowel  obstruction or perforation. 2. No definite signs of metastatic disease in the abdomen or pelvis. Soft tissue nodularity in the right pelvis is probably due to an unopacified pelvic vein. 3. Multiple pulmonary nodules, some cavitary. These are not typical of metastatic adenocarcinoma (especially without evidence of abdominal disease). Consider multicentric or metastatic lung cancer. PET-CT may be helpful to assess hypermetabolic activity and guided tissue sampling. 4. Multiple bladder calculi consistent with chronic bladder outlet obstruction from prostatomegaly. Nonobstructing right renal calculus. No hydronephrosis. 5. Aortic Atherosclerosis (ICD10-I70.0). Additional incidental findings including a moderate size hiatal hernia and renal cysts. Electronically Signed   By: Richardean Sale M.D.   On: 10/28/2017 15:01   Nm Pet Image Initial (pi) Skull Base To Thigh  Result Date: 11/11/2017 CLINICAL DATA:  Initial treatment strategy for rectal cancer. Pulmonary nodules. EXAM: NUCLEAR MEDICINE PET SKULL BASE TO THIGH TECHNIQUE: 8.11 mCi F-18 FDG was injected intravenously. Full-ring PET imaging was performed from the skull base to thigh after the radiotracer. CT data was obtained and used for attenuation correction and anatomic localization. FASTING BLOOD GLUCOSE:  Value: 82 mg/dl COMPARISON:  CT chest abdomen pelvis dated 10/28/2017.  FINDINGS: NECK: No hypermetabolic cervical lymphadenopathy. CHEST: Bilateral pulmonary nodules, including: --15 mm partially cavitary nodule in the posterior left upper lobe (series 8/ image 22), max SUV 8.0 --13 mm nodule in the anterior right middle lobe (series 8/ image 44), max SUV 3.7 --2.3 cm nodule in the posterior right lung base (series 8/image 45), max SUV 6.0 No hypermetabolic thoracic lymphadenopathy. ABDOMEN/PELVIS: Wall thickening/ mass involving the upper rectum (series 4/image 60), corresponding to the patient's known rectal adenocarcinoma. No hypermetabolic abdominopelvic lymphadenopathy. No suspicious hypermetabolism in the liver, spleen, pancreas, or adrenal glands. Bilateral renal cysts, measuring up to 6.0 cm in the posterior right upper kidney (series 4/ image 109). 11 mm right upper pole renal calculus (series 4/image 105), without hydronephrosis. Multiple large bladder calculi measuring up to 2.4 cm (series 4/ image 162). prostatomegaly, suggesting BPH. Atherosclerotic calcifications the abdominal aorta and branch vessels. SKELETON: No focal hypermetabolic activity to suggest skeletal metastasis. Degenerative changes of the visualized thoracolumbar spine. IMPRESSION: Wall thickening/ mass involving the upper rectum, corresponding to the patient's known rectal adenocarcinoma. Bilateral pulmonary nodules, partially cavitary in the left upper lobe nodule, suspicious for metastases. Notably, cavitary metastases are more common with squamous cell carcinoma rather than the patient's known adenocarcinoma. Consider tissue sampling as clinically warranted. Electronically Signed   By: Julian Hy M.D.   On: 11/11/2017 12:58    ASSESSMENT & PLAN:  AROLDO GALLI is a 83 y.o. male without a significant past medical history, presented with iron deficient anemia.  He underwent a colonoscopy which showed a non-obstructive mass from proximal rectum to mid rectum.   1. Rectal Cancer, invasive  adenocarcinoma, [id number] -I reviewed his colonoscopy and biopsy pathology results in great details with patient  -We discussed his CT Scan from 10/28/17 which shows multiple lung nodules, largest 2cm in RLL, which is very concerning for metastasis. I recommend a lung biopsy to rule out possible lung primary and to determine staging. I explained the risks of lung biopsy. After a lengthy discussion, he states he values his quality of life much more than quantity of his life, and would like to wait on his lung biopsy.  -I review his PET scan images in person with pt, his bilateral lung lesions I hypermetabolic on the PET scan, no significant thoracic adenopathy, this is likely metastatic disease from his  rectal cancer, also primary lung cancer is not completely ruled out.  I again strongly encouraged him to consider biopsy if he would like to further treatment after he completes chemo and radiation.  He will considered after radiation. -He has started concurrent chemoradiation, tolerating well so far.  He still has moderate diarrhea, taking Imodium.  No other complaints. -Labs reviewed, anemia resolved, CMP WNL. His CEA is still pending from today. -F/u on 12/24   2. Anemia, iron deficient  -10/22/17 labs showed anemia with hemoglobin 8.0, MCV 64.9, normal WBC and platelet, this is likely iron deficient anemia, secondary to his rectal cancer bleeding. -He received IV Feraheme 11/26 and again on 11/30.  -His 11/02/17 Hg was 7.3, I suggest he needs a blood transfusion in the next few days. He agreed.  -He has received 2 dose of Feraheme on 11/02/17 and 11/09/17. He Hg has responded well and has resolved now at 12 (11/19/17) -He told him he is fine to restart oral iron, but he does not want to restart at this time.   3. Lung masses  -Likely metastatic disease from his rectal cancer, also primary lung cancer with metastasis is also possible. -Patient declined lung biopsy now -11/11/17 PET scan shows uptake  in bilateral lung masses. I recommended a lung biopsy for definitve diagnosis. He will consider after current chemo and radiation.    4. Goal of care discussion  -We again discussed the incurable nature of his cancer, and the overall poor prognosis, especially if he does not have good response to radiation, chemotherapy or progress on chemo -The patient understands the goal of care is palliative. -I recommend DNR/DNI, he will think about it    PLAN: -Pet Scan reviewed -Lab reviewed, adequate to continue chemoradiation with Xeloda.  -Cancel his lab and f/u on 12/17  -Lab and f/u on 12/24   No orders of the defined types were placed in this encounter.   All questions were answered. The patient knows to call the clinic with any problems, questions or concerns.  I spent 25 minutes counseling the patient face to face. The total time spent in the appointment was 30 minutes and more than 50% was on counseling.     Truitt Merle, MD 11/19/2017   This document serves as a record of services personally performed by Truitt Merle, MD. It was created on her behalf by Joslyn Devon, a trained medical scribe. The creation of this record is based on the scribe's personal observations and the provider's statements to them.    I have reviewed the above documentation for accuracy and completeness, and I agree with the above.

## 2017-11-18 NOTE — Telephone Encounter (Signed)
Spoke to patient regarding upcoming December appointments per provider request.

## 2017-11-19 ENCOUNTER — Other Ambulatory Visit (HOSPITAL_BASED_OUTPATIENT_CLINIC_OR_DEPARTMENT_OTHER): Payer: Medicare Other

## 2017-11-19 ENCOUNTER — Ambulatory Visit
Admission: RE | Admit: 2017-11-19 | Discharge: 2017-11-19 | Disposition: A | Discharge status: Home / Self Care | Payer: Medicare Other | Source: Ambulatory Visit | Attending: Radiation Oncology | Admitting: Radiation Oncology

## 2017-11-19 ENCOUNTER — Ambulatory Visit (HOSPITAL_BASED_OUTPATIENT_CLINIC_OR_DEPARTMENT_OTHER): Payer: Medicare Other | Admitting: Hematology

## 2017-11-19 ENCOUNTER — Encounter: Payer: Self-pay | Admitting: Hematology

## 2017-11-19 VITALS — BP 146/84 | HR 73 | Temp 98.7°F | Resp 20 | Ht 70.0 in | Wt 161.6 lb

## 2017-11-19 DIAGNOSIS — D509 Iron deficiency anemia, unspecified: Secondary | ICD-10-CM

## 2017-11-19 DIAGNOSIS — C20 Malignant neoplasm of rectum: Secondary | ICD-10-CM

## 2017-11-19 DIAGNOSIS — R918 Other nonspecific abnormal finding of lung field: Secondary | ICD-10-CM

## 2017-11-19 DIAGNOSIS — Z510 Encounter for antineoplastic radiation therapy: Secondary | ICD-10-CM | POA: Diagnosis not present

## 2017-11-19 DIAGNOSIS — D500 Iron deficiency anemia secondary to blood loss (chronic): Secondary | ICD-10-CM

## 2017-11-19 LAB — COMPREHENSIVE METABOLIC PANEL
ALBUMIN: 3.6 g/dL (ref 3.5–5.0)
ALK PHOS: 61 U/L (ref 40–150)
ALT: 19 U/L (ref 0–55)
ANION GAP: 10 meq/L (ref 3–11)
AST: 20 U/L (ref 5–34)
BILIRUBIN TOTAL: 0.61 mg/dL (ref 0.20–1.20)
BUN: 15.4 mg/dL (ref 7.0–26.0)
CO2: 24 meq/L (ref 22–29)
CREATININE: 1.2 mg/dL (ref 0.7–1.3)
Calcium: 9.2 mg/dL (ref 8.4–10.4)
Chloride: 107 mEq/L (ref 98–109)
EGFR: >60 mL/min/{1.73_m2} (ref >60)
GLUCOSE: 109 mg/dL (ref 70–140)
Potassium: 3.7 mEq/L (ref 3.5–5.1)
SODIUM: 141 meq/L (ref 136–145)
TOTAL PROTEIN: 6.7 g/dL (ref 6.4–8.3)

## 2017-11-19 LAB — CBC WITH DIFFERENTIAL/PLATELET
BASO%: 0.8 % (ref 0.0–2.0)
BASOS ABS: 0 10*3/uL (ref 0.0–0.1)
EOS ABS: 0.1 10*3/uL (ref 0.0–0.5)
EOS%: 2.5 % (ref 0.0–7.0)
HEMATOCRIT: 41.1 % (ref 38.4–49.9)
HEMOGLOBIN: 13 g/dL (ref 13.0–17.1)
LYMPH#: 0.7 10*3/uL — AB (ref 0.9–3.3)
LYMPH%: 12.6 % — ABNORMAL LOW (ref 14.0–49.0)
MCH: 24.3 pg — AB (ref 27.2–33.4)
MCHC: 31.6 g/dL — ABNORMAL LOW (ref 32.0–36.0)
MCV: 76.7 fL — AB (ref 79.3–98.0)
MONO#: 0.6 10*3/uL (ref 0.1–0.9)
MONO%: 12 % (ref 0.0–14.0)
NEUT#: 3.7 10*3/uL (ref 1.5–6.5)
NEUT%: 72.1 % (ref 39.0–75.0)
Platelets: 169 10*3/uL (ref 140–400)
RBC: 5.36 10*6/uL (ref 4.20–5.82)
RDW: 31.8 % — ABNORMAL HIGH (ref 11.0–14.6)
WBC: 5.2 10*3/uL (ref 4.0–10.3)
nRBC: 0 % (ref 0–0)

## 2017-11-19 LAB — CEA (IN HOUSE-CHCC): CEA (CHCC-In House): 7.32 ng/mL — ABNORMAL HIGH (ref 0.00–5.00)

## 2017-11-19 LAB — TECHNOLOGIST REVIEW

## 2017-11-20 ENCOUNTER — Ambulatory Visit
Admission: RE | Admit: 2017-11-20 | Discharge: 2017-11-20 | Disposition: A | Discharge status: Home / Self Care | Payer: Medicare Other | Source: Ambulatory Visit | Attending: Radiation Oncology | Admitting: Radiation Oncology

## 2017-11-20 DIAGNOSIS — Z510 Encounter for antineoplastic radiation therapy: Secondary | ICD-10-CM | POA: Diagnosis not present

## 2017-11-20 NOTE — Progress Notes (Signed)
  Radiation Oncology         [phone] ________________________________  Name: Adrian Banks MRN: [id number]  Date: 10/28/2017  DOB: 06/04/1932   SIMULATION AND TREATMENT PLANNING NOTE  DIAGNOSIS:     ICD-10-CM   1. Rectal cancer ([place]) C20      The patient presented for simulation for the patient's upcoming course of radiation for the diagnosis of rectal cancer. The patient was placed in a supine position. A customized vac-lock bag was constructed to aid in patient immobilization on. This complex treatment device will be used on a daily basis during the treatment. In this fashion a CT scan was obtained through the pelvic region and the isocenter was placed near midline within the pelvis. Surface markings were placed.  The patient's imaging was loaded into the radiation treatment planning system. The patient will initially be planned to receive a course of radiation to a dose of 45 Gy. This will be accomplished in 25 fractions at 1.8 gray per fraction. This initial treatment will correspond to a 3-D conformal technique. The target has been contoured in addition to the rectum, bladder and femoral heads. Dose volume histograms of each of these structures have been requested and these will be carefully reviewed as part of the 3-D conformal treatment planning process. To accomplish this initial treatment, 4 customized blocks have been designed for this purpose. Each of these 4 complex treatment devices will be used on a daily basis during the initial course of the treatment. It is anticipated that the patient will then receive a boost for an additional 5.4 Gy. The anticipated total dose therefore will be 50.4 Gy.    Special treatment procedure The patient will receive chemotherapy during the course of radiation treatment. The patient may experience increased or overlapping toxicity due to this combined-modality approach and the patient will be monitored for such problems. This may include extra  lab work as necessary. This therefore constitutes a special treatment procedure.    ________________________________  Jodelle Gross, MD, PhD

## 2017-11-20 NOTE — Progress Notes (Signed)
  Radiation Oncology         [phone] ________________________________  Name: MAKARI PORTMAN MRN: [id number]  Date: 10/28/2017  DOB: [birth year]  Optical Surface Tracking Plan:  Since intensity modulated radiotherapy (IMRT) and 3D conformal radiation treatment methods are predicated on accurate and precise positioning for treatment, intrafraction motion monitoring is medically necessary to ensure accurate and safe treatment delivery.  The ability to quantify intrafraction motion without excessive ionizing radiation dose can only be performed with optical surface tracking. Accordingly, surface imaging offers the opportunity to obtain 3D measurements of patient position throughout IMRT and 3D treatments without excessive radiation exposure.  I am ordering optical surface tracking for this patient's upcoming course of radiotherapy. ________________________________  Kyung Rudd, MD 11/20/2017 1:21 PM    Reference:   Ursula Alert, J, et al. Surface imaging-based analysis of intrafraction motion for breast radiotherapy patients.Journal of [place], n. 6, nov. 2014. ISSN [id number].   Available at: <http://www.jacmp.org/index.php/jacmp/article/view/4957>.

## 2017-11-23 ENCOUNTER — Ambulatory Visit
Admission: RE | Admit: 2017-11-23 | Discharge: 2017-11-23 | Disposition: A | Discharge status: Home / Self Care | Payer: Medicare Other | Source: Ambulatory Visit | Attending: Radiation Oncology | Admitting: Radiation Oncology

## 2017-11-23 ENCOUNTER — Ambulatory Visit: Payer: Medicare Other | Admitting: Nurse Practitioner

## 2017-11-23 ENCOUNTER — Other Ambulatory Visit: Payer: Medicare Other

## 2017-11-23 DIAGNOSIS — Z510 Encounter for antineoplastic radiation therapy: Secondary | ICD-10-CM | POA: Diagnosis not present

## 2017-11-24 ENCOUNTER — Ambulatory Visit
Admission: RE | Admit: 2017-11-24 | Discharge: 2017-11-24 | Disposition: A | Discharge status: Home / Self Care | Payer: Medicare Other | Source: Ambulatory Visit | Attending: Radiation Oncology | Admitting: Radiation Oncology

## 2017-11-24 DIAGNOSIS — Z510 Encounter for antineoplastic radiation therapy: Secondary | ICD-10-CM | POA: Diagnosis not present

## 2017-11-25 ENCOUNTER — Ambulatory Visit
Admission: RE | Admit: 2017-11-25 | Discharge: 2017-11-25 | Disposition: A | Discharge status: Home / Self Care | Payer: Medicare Other | Source: Ambulatory Visit | Attending: Radiation Oncology | Admitting: Radiation Oncology

## 2017-11-25 DIAGNOSIS — Z510 Encounter for antineoplastic radiation therapy: Secondary | ICD-10-CM | POA: Diagnosis not present

## 2017-11-26 ENCOUNTER — Ambulatory Visit
Admission: RE | Admit: 2017-11-26 | Discharge: 2017-11-26 | Disposition: A | Discharge status: Home / Self Care | Payer: Medicare Other | Source: Ambulatory Visit | Attending: Radiation Oncology | Admitting: Radiation Oncology

## 2017-11-26 DIAGNOSIS — Z510 Encounter for antineoplastic radiation therapy: Secondary | ICD-10-CM | POA: Diagnosis not present

## 2017-11-27 ENCOUNTER — Other Ambulatory Visit: Payer: Self-pay | Admitting: *Deleted

## 2017-11-27 ENCOUNTER — Other Ambulatory Visit: Payer: Self-pay | Admitting: Radiation Oncology

## 2017-11-27 ENCOUNTER — Ambulatory Visit (HOSPITAL_BASED_OUTPATIENT_CLINIC_OR_DEPARTMENT_OTHER)
Admission: RE | Admit: 2017-11-27 | Discharge: 2017-11-27 | Disposition: A | Discharge status: Home / Self Care | Payer: Medicare Other | Source: Ambulatory Visit | Attending: Radiation Oncology | Admitting: Radiation Oncology

## 2017-11-27 ENCOUNTER — Ambulatory Visit
Admission: RE | Admit: 2017-11-27 | Discharge: 2017-11-27 | Disposition: A | Discharge status: Home / Self Care | Payer: Medicare Other | Source: Ambulatory Visit | Attending: Radiation Oncology | Admitting: Radiation Oncology

## 2017-11-27 ENCOUNTER — Telehealth: Payer: Self-pay | Admitting: *Deleted

## 2017-11-27 DIAGNOSIS — R300 Dysuria: Secondary | ICD-10-CM

## 2017-11-27 DIAGNOSIS — D509 Iron deficiency anemia, unspecified: Secondary | ICD-10-CM

## 2017-11-27 DIAGNOSIS — C20 Malignant neoplasm of rectum: Secondary | ICD-10-CM | POA: Diagnosis not present

## 2017-11-27 DIAGNOSIS — Z510 Encounter for antineoplastic radiation therapy: Secondary | ICD-10-CM | POA: Diagnosis not present

## 2017-11-27 LAB — URINALYSIS, MICROSCOPIC - CHCC
Bilirubin (Urine): NEGATIVE
GLUCOSE UR CHCC: NEGATIVE mg/dL
KETONES: NEGATIVE mg/dL
Nitrite: POSITIVE
Protein: 300 mg/dL
SPECIFIC GRAVITY, URINE: 1.025 (ref 1.003–1.035)
UROBILINOGEN UR: 0.2 mg/dL (ref 0.2–1)
pH: 6 (ref 4.6–8.0)

## 2017-11-27 MED ORDER — NITROFURANTOIN MONOHYD MACRO 100 MG PO CAPS: 100.0000 mg | ORAL_CAPSULE | Freq: Two times a day (BID) | ORAL | 0 refills | Status: DC

## 2017-11-27 NOTE — Progress Notes (Signed)
[facility]  [phone] [phone]  Clinic Follow up Note   Patient Care Team: Kaplan, Kristen W., PA-C as PCP - General (Family Medicine) 11/30/2017  CHIEF COMPLAINT: F/u rectal cancer  SUMMARY OF ONCOLOGIC HISTORY:   Rectal cancer (HCC)   09/29/2017 Procedure    Colonoscopy by Dr. Ganem 09/29/17  Findings:  The perianal and digital rectal examination were normal.  A non-obstructing large mass was found in the proximal rectum and in the mid rectum.  The mass was circumferential.  This was biopsied with a cold forceps for histology.  Area was tattooed with an injection of [place] ink both distal and proximal to the mass.  There is about 4-5 cm of rectal mucosa distal to the mass that looks norma. This mass is about 7-8 cm in length.  A 10 mm polyp was found in the ascending colon. The polyp was sessile. the polyp was removed with a hot snare.  Resection and retrieval were complete.        09/29/2017 Initial Biopsy    FINAL DIAGNOSIS 09/29/17  1. LG Intestine - Ascending Colon Polyp:  TUBULAR ADENOMA 2. LG Intestine - Rectum, Bx: INVASIVE MODERATELY DIFFERENTIATED ADENOCARCINOMA. THE MSI TESTING IS PENDING AND THE RESULT WILL BE REPORTED AS AN ADDENDUM.      10/21/2017 Initial Diagnosis    Rectal cancer (HCC)      10/28/2017 Imaging    CT CAP W Contrast  IMPRESSION: 1. Long segment circumferential wall thickening of the rectosigmoid colon consistent with known adenocarcinoma. No evidence of bowel obstruction or perforation. 2. No definite signs of metastatic disease in the abdomen or pelvis. Soft tissue nodularity in the right pelvis is probably due to an unopacified pelvic vein. 3. Multiple pulmonary nodules, some cavitary. These are not typical of metastatic adenocarcinoma (especially without evidence of abdominal disease). Consider multicentric or metastatic lung cancer. PET-CT may be helpful to assess hypermetabolic activity  and guided tissue sampling. 4. Multiple bladder calculi consistent with chronic bladder outlet obstruction from prostatomegaly. Nonobstructing right renal calculus. No hydronephrosis. 5. Aortic Atherosclerosis (ICD10-I70.0). Additional incidental findings including a moderate size hiatal hernia and renal cysts.       11/05/2017 -  Radiation Therapy    Radiation with Dr. Moody With Xeloda, plan to start 11/05/17      11/09/2017 -  Chemotherapy    Chemoradiation with Xeloda 825mg/m2 q12h, M-F on days of radiation plan to starting 11/09/17      11/11/2017 Imaging    PET Scan  IMPRESSION:  Wall thickening/ mass involving the upper rectum, corresponding to the patient's known rectal adenocarcinoma. Bilateral pulmonary nodules, partially cavitary in the left upper lobe nodule, suspicious for metastases. Notably, cavitary metastases are more common with squamous cell carcinoma rather than the patient's known adenocarcinoma.      CURRENT THERAPY: Radiation starting 11/05/17 and with Xeloda 1500mg ( 825mg/m2) q12h M-F on days of radiation starting 11/09/17   INTERVAL HISTORY: [name] returns today for f/u as scheduled. He continues chemoradiation with xeloda 3 tabs AM/3 tabs PM on radiation days. Began having dysuria on Friday 12/21, Dr. Moody prescribed macrobid which he began that day; symptoms are improving. Denies hematuria. Having frequent diarrhea, approx 4 episodes per day; no nausea or vomiting. He takes 2 imodium with first episode and takes about 4 tablets total per day; he is worried about taking too much. Imodium usually stops diarrhea. Mild discomfort and sensitivity and occasional blood with BM especially with wiping. Has   mild fatigue at baseline, eating and drinking well; no fever or chills. Denies mucositis. Mild redness to feet, no pain or sensitivity; no redness or pain to hands. Occasional mild dyspnea on exertion, at baseline; no cough, chest pain, palpitations, or lower  extremity swelling.    MEDICAL HISTORY:  Past Medical History:  Diagnosis Date  . Cancer (HCC) 09/29/2017   Rectal cvancer    SURGICAL HISTORY: History reviewed. No pertinent surgical history.  I have reviewed the social history and family history with the patient and they are unchanged from previous note.  ALLERGIES:  has No Known Allergies.  MEDICATIONS:  Current Outpatient Medications  Medication Sig Dispense Refill  . capecitabine (XELODA) 500 MG tablet Take 3 tablets (1500mg) by mouth 2 times daily, within 30 minutes of food, on radiation days only, M-F 180 tablet 0  . finasteride (PROSCAR) 5 MG tablet Take 5 mg by mouth.    . lovastatin (MEVACOR) 40 MG tablet Take 40 mg by mouth.    . nitrofurantoin, macrocrystal-monohydrate, (MACROBID) 100 MG capsule Take 1 capsule (100 mg total) by mouth 2 (two) times daily. 10 capsule 0   No current facility-administered medications for this visit.     PHYSICAL EXAMINATION: ECOG PERFORMANCE STATUS: 1 - Symptomatic but completely ambulatory  Vitals:   11/30/17 1151  BP: 129/77  Pulse: 88  Resp: 18  Temp: 98.4 F (36.9 C)  SpO2: 98%   Filed Weights   11/30/17 1151  Weight: 163 lb 6.4 oz (74.1 kg)    GENERAL:alert, no distress and comfortable SKIN: skin color, texture, turgor are normal, no rashes or significant lesions (+) mild plantar erythema with dry skin EYES: normal, Conjunctiva are pink and non-injected, sclera clear OROPHARYNX:no exudate, no erythema and lips, buccal mucosa, and tongue normal  NECK: supple, thyroid normal size, non-tender, without nodularity LYMPH:  no palpable cervical, supraclavicular, axillary, or inguinal lymphadenopathy  LUNGS: clear to auscultation bilaterally with normal breathing effort HEART: regular rate & rhythm and no murmurs and no lower extremity edema ABDOMEN:abdomen soft, non-tender and normal bowel sounds Musculoskeletal:no cyanosis of digits and no clubbing  NEURO: alert &  oriented x 3 with fluent speech, no focal motor/sensory deficits RECTAL: external exam reveals intact skin with hyperpigmentation to skin folds of groin and to gluteal cleft; no erythema, ulceration, desquamation, or breakdown.   LABORATORY DATA:  I have reviewed the data as listed CBC Latest Ref Rng & Units 11/30/2017 11/19/2017 11/09/2017  WBC 4.0 - 10.3 10e3/uL 6.7 5.2 7.6  Hemoglobin 13.0 - 17.1 g/dL 12.5(L) 13.0 11.7(L)  Hematocrit 38.4 - 49.9 % 38.8 41.1 37.4(L)  Platelets 140 - 400 10e3/uL 137(L) 169 241     CMP Latest Ref Rng & Units 11/30/2017 11/19/2017 11/09/2017  Glucose 70 - 140 mg/dl 102 109 94  BUN 7.0 - 26.0 mg/dL 15.9 15.4 17.8  Creatinine 0.7 - 1.3 mg/dL 1.2 1.2 1.3  Sodium 136 - 145 mEq/L 141 141 140  Potassium 3.5 - 5.1 mEq/L 3.7 3.7 3.5  CO2 22 - 29 mEq/L 24 24 22  Calcium 8.4 - 10.4 mg/dL 8.9 9.2 9.1  Total Protein 6.4 - 8.3 g/dL 6.3(L) 6.7 6.9  Total Bilirubin 0.20 - 1.20 mg/dL 0.55 0.61 0.67  Alkaline Phos 40 - 150 U/L 55 61 70  AST 5 - 34 U/L 24 20 18  ALT 0 - 55 U/L 18 19 13   PATHOLOGY   FINAL DIAGNOSIS 09/29/17  1. LG Intestine - Ascending Colon Polyp:  TUBULAR ADENOMA   2. LG Intestine - Rectum, Bx: INVASIVE MODERATELY DIFFERENTIATED ADENOCARCINOMA. THE MSI TESTING IS PENDING AND THE RESULT WILL BE REPORTED AS AN ADDENDUM.    PROCEDURES   Colonoscopy by Dr. Ganem 09/29/17  Findings:  The perianal and digital rectal examination were normal.  A non-obstructing large mass was found in the proximal rectum and in the mid rectum.  The mass was circumferential.  This was biopsied with a cold forceps for histology.  Area was tattooed with an injection of [place] ink both distal and proximal to the mass.  There is about 4-5 cm of rectal mucosa distal to the mass that looks norma. This mass is about 7-8 cm in length.  A 10 mm polyp was found in the ascending colon. The polyp was sessile. the polyp was removed with a hot snare.  Resection and retrieval  were complete.     RADIOGRAPHIC STUDIES: I have personally reviewed the radiological images as listed and agreed with the findings in the report. No results found.   ASSESSMENT & PLAN: Adrian Banks is a 83 y.o. male without a significant past medical history, presented with iron deficient anemia.  He underwent a colonoscopy which showed a non-obstructive mass from proximal rectum to mid rectum.  1. Rectal Cancer, invasive adenocarcinoma, [id number] 2. Anemia, iron deficient 3. Lung masses 4. Goals of care discussion 5. Treatment toxicities - mild skin hyperpigmentation and plantar erythema   Mr. Lacount appears stable today; having mild toxicities related to chemoradiation including skin hyperpigmentation to radiation area, plantar erythema without dysesthesia, and diarrhea. I recommend OTC hydrocortisone to feet TID PRN; he can use on hands if they become red. If redness worsens and he develops pain, will prescribe urea cream. I encouraged him to increase imodium to maximum dose for better anti-diarrhea effect. Complete macrobid as prescribed by Dr. Moody for dysuria and nitrite-positive UA. VS and weight stable. CBC with mild anemia, Hgb 12.5 and thrombocytopenia, plt 137k secondary to chemoradiation; will monitor closely. Cmet unremarkable. CEA pending. He will continue chemoradiation with Xeloda 1500 mg BID (3 tablets AM and 3 tablets PM) on radiation days M-F. He will return in 1 week for lab and f/u with Dr. Feng.   PLAN Labs reviewed, continue chemoradiation with Xeloda 1500 mg BID on radiation days M-F (last treatment expected 1/11) hydrocortidone TID PRN to feet Increase imodium PRN for diarrhea Complete macrobid per Dr. Moody Return in 1 week for lab and f/u with Dr. Feng   All questions were answered. The patient knows to call the clinic with any problems, questions or concerns. No barriers to learning was detected.     Lacie K Burton, NP 11/30/17    

## 2017-11-27 NOTE — Telephone Encounter (Signed)
Showed  U/A to MD who called in antibiotics to [facility] on [address], called and spoke with the wife and informed her to call [facility] by 4pm if rx is ready,, she thanked this Rn for the call 1:41 PM

## 2017-11-29 LAB — URINE CULTURE

## 2017-11-30 ENCOUNTER — Ambulatory Visit (HOSPITAL_BASED_OUTPATIENT_CLINIC_OR_DEPARTMENT_OTHER): Payer: Medicare Other | Admitting: Nurse Practitioner

## 2017-11-30 ENCOUNTER — Other Ambulatory Visit (HOSPITAL_BASED_OUTPATIENT_CLINIC_OR_DEPARTMENT_OTHER): Payer: Medicare Other

## 2017-11-30 ENCOUNTER — Encounter: Payer: Self-pay | Admitting: Nurse Practitioner

## 2017-11-30 ENCOUNTER — Ambulatory Visit
Admission: RE | Admit: 2017-11-30 | Discharge: 2017-11-30 | Disposition: A | Discharge status: Home / Self Care | Payer: Medicare Other | Source: Ambulatory Visit | Attending: Radiation Oncology | Admitting: Radiation Oncology

## 2017-11-30 VITALS — BP 129/77 | HR 88 | Temp 98.4°F | Resp 18 | Ht 70.0 in | Wt 163.4 lb

## 2017-11-30 DIAGNOSIS — R197 Diarrhea, unspecified: Secondary | ICD-10-CM

## 2017-11-30 DIAGNOSIS — D509 Iron deficiency anemia, unspecified: Secondary | ICD-10-CM

## 2017-11-30 DIAGNOSIS — Z510 Encounter for antineoplastic radiation therapy: Secondary | ICD-10-CM | POA: Diagnosis not present

## 2017-11-30 DIAGNOSIS — C20 Malignant neoplasm of rectum: Secondary | ICD-10-CM | POA: Diagnosis not present

## 2017-11-30 DIAGNOSIS — D500 Iron deficiency anemia secondary to blood loss (chronic): Secondary | ICD-10-CM

## 2017-11-30 LAB — CBC WITH DIFFERENTIAL/PLATELET
BASO%: 0.9 % (ref 0.0–2.0)
BASOS ABS: 0.1 10*3/uL (ref 0.0–0.1)
EOS%: 2.1 % (ref 0.0–7.0)
Eosinophils Absolute: 0.1 10*3/uL (ref 0.0–0.5)
HCT: 38.8 % (ref 38.4–49.9)
HGB: 12.5 g/dL — ABNORMAL LOW (ref 13.0–17.1)
LYMPH%: 4.8 % — ABNORMAL LOW (ref 14.0–49.0)
MCH: 25.1 pg — AB (ref 27.2–33.4)
MCHC: 32.2 g/dL (ref 32.0–36.0)
MCV: 77.8 fL — AB (ref 79.3–98.0)
MONO#: 0.7 10*3/uL (ref 0.1–0.9)
MONO%: 11.2 % (ref 0.0–14.0)
NEUT#: 5.4 10*3/uL (ref 1.5–6.5)
NEUT%: 81 % — AB (ref 39.0–75.0)
Platelets: 137 10*3/uL — ABNORMAL LOW (ref 140–400)
RBC: 4.99 10*6/uL (ref 4.20–5.82)
RDW: 36.7 % — ABNORMAL HIGH (ref 11.0–14.6)
WBC: 6.7 10*3/uL (ref 4.0–10.3)
lymph#: 0.3 10*3/uL — ABNORMAL LOW (ref 0.9–3.3)
nRBC: 0 % (ref 0–0)

## 2017-11-30 LAB — COMPREHENSIVE METABOLIC PANEL
ALT: 18 U/L (ref 0–55)
ANION GAP: 10 meq/L (ref 3–11)
AST: 24 U/L (ref 5–34)
Albumin: 3.3 g/dL — ABNORMAL LOW (ref 3.5–5.0)
Alkaline Phosphatase: 55 U/L (ref 40–150)
BILIRUBIN TOTAL: 0.55 mg/dL (ref 0.20–1.20)
BUN: 15.9 mg/dL (ref 7.0–26.0)
CO2: 24 meq/L (ref 22–29)
CREATININE: 1.2 mg/dL (ref 0.7–1.3)
Calcium: 8.9 mg/dL (ref 8.4–10.4)
Chloride: 107 mEq/L (ref 98–109)
EGFR: >60 mL/min/{1.73_m2} (ref >60)
GLUCOSE: 102 mg/dL (ref 70–140)
Potassium: 3.7 mEq/L (ref 3.5–5.1)
SODIUM: 141 meq/L (ref 136–145)
TOTAL PROTEIN: 6.3 g/dL — AB (ref 6.4–8.3)

## 2017-11-30 LAB — CEA (IN HOUSE-CHCC): CEA (CHCC-In House): 5.94 ng/mL — ABNORMAL HIGH (ref 0.00–5.00)

## 2017-12-02 ENCOUNTER — Telehealth: Payer: Self-pay | Admitting: Nurse Practitioner

## 2017-12-02 ENCOUNTER — Ambulatory Visit
Admission: RE | Admit: 2017-12-02 | Discharge: 2017-12-02 | Disposition: A | Discharge status: Home / Self Care | Payer: Medicare Other | Source: Ambulatory Visit | Attending: Radiation Oncology | Admitting: Radiation Oncology

## 2017-12-02 DIAGNOSIS — Z510 Encounter for antineoplastic radiation therapy: Secondary | ICD-10-CM | POA: Diagnosis not present

## 2017-12-02 NOTE — Telephone Encounter (Signed)
Scheduled appt per 12/24 los - patient to get an updated schedule next visit on 12/31

## 2017-12-03 ENCOUNTER — Ambulatory Visit
Admission: RE | Admit: 2017-12-03 | Discharge: 2017-12-03 | Disposition: A | Discharge status: Home / Self Care | Payer: Medicare Other | Source: Ambulatory Visit | Attending: Radiation Oncology | Admitting: Radiation Oncology

## 2017-12-03 DIAGNOSIS — Z510 Encounter for antineoplastic radiation therapy: Secondary | ICD-10-CM | POA: Diagnosis not present

## 2017-12-03 NOTE — Progress Notes (Signed)
**Note Adrian-Identified via Obfuscation** Adrian Banks  [phone] [phone]  Clinic Follow Up Note   [name]   Date of Service: 12/07/2017   CHIEF COMPLAINTS:  Follow up rectal Cancer    Rectal cancer ([name])   09/29/2017 Procedure    Colonoscopy by Dr. Penelope Coop 09/29/17  Findings:  The perianal and digital rectal examination were normal.  A non-obstructing large mass was found in the proximal rectum and in the mid rectum.  The mass was circumferential.  This was biopsied with a cold forceps for histology.  Area was tattooed with an injection of Niger ink both distal and proximal to the mass.  There is about 4-5 cm of rectal mucosa distal to the mass that looks norma. This mass is about 7-8 cm in length.  A 10 mm polyp was found in the ascending colon. The polyp was sessile. the polyp was removed with a hot snare.  Resection and retrieval were complete.        09/29/2017 Initial Biopsy    FINAL DIAGNOSIS 09/29/17  1. LG Intestine - Ascending Colon Polyp:  TUBULAR ADENOMA 2. LG Intestine - Rectum, Bx: INVASIVE MODERATELY DIFFERENTIATED ADENOCARCINOMA. THE MSI TESTING IS PENDING AND THE RESULT WILL BE REPORTED AS AN ADDENDUM.      10/21/2017 Initial Diagnosis    Rectal cancer ([name])      10/28/2017 Imaging    CT CAP W Contrast  IMPRESSION: 1. Long segment circumferential wall thickening of the rectosigmoid colon consistent with known adenocarcinoma. No evidence of bowel obstruction or perforation. 2. No definite signs of metastatic disease in the abdomen or pelvis. Soft tissue nodularity in the right pelvis is probably due to an unopacified pelvic vein. 3. Multiple pulmonary nodules, some cavitary. These are not typical of metastatic adenocarcinoma (especially without evidence of abdominal disease). Consider multicentric or metastatic lung cancer. PET-CT may be helpful to assess hypermetabolic activity  and guided tissue sampling. 4. Multiple bladder calculi consistent with chronic bladder outlet obstruction from prostatomegaly. Nonobstructing right renal calculus. No hydronephrosis. 5. Aortic Atherosclerosis (ICD10-I70.0). Additional incidental findings including a moderate size hiatal hernia and renal cysts.       11/05/2017 -  Radiation Therapy    Radiation with Dr. Lisbeth Renshaw With Xeloda, plan to start 11/05/17      11/09/2017 -  Chemotherapy    Chemoradiation with Xeloda 825m/m2 q12h, M-F on days of radiation plan to starting 11/09/17      11/11/2017 Imaging    PET Scan  IMPRESSION:  Wall thickening/ mass involving the upper rectum, corresponding to the patient's known rectal adenocarcinoma. Bilateral pulmonary nodules, partially cavitary in the left upper lobe nodule, suspicious for metastases. Notably, cavitary metastases are more common with squamous cell carcinoma rather than the patient's known adenocarcinoma.         HISTORY OF PRESENTING ILLNESS: 10/22/17  [name] Comment83y.o. male is here because of newly diagnosed rectal cancer. He was referred by Dr. TMarcello Moores He presents to the clinic today by himself.   Pt is a poor historian. He denies any significant abdominal discomfort, nausea, bloating, rectal bleeding or change in bowel habits.  He was referred to GI doctor Ganem for colonoscopy by his PCP due to deficient anemia, which showed a nonobstructing mass in the proximal rectum to mid rectum, biopsy showed invasive adenocarcinoma.  He was referred to colorectal surgeon Dr. TMarcello Moores who recommended surgical resection.  [place]ello [place]  and to Dr. Lisbeth Renshaw for evaluation of new adjuvant chemoradiation.  Marcello Moores has ordered staging CT chest, abdomen and pelvis, and pelvic MRI for staging these scans has not been scheduled yet.   He has had some dysuria in the past few months, was seen by his PCP and diagnosed with BPH. He started Proscar.  He is married, lives with  his wife, daughter independent and functions well.   CURRENT THERAPY: Radiation starting 11/05/17 and with Xeloda 153m ( 8255mm2) q12h M-F on days of radiation starting 11/09/17   INTERVAL HISTORY:  83y.o. male is here for a follow up. He presents in the clinic today noting this is his 2nd week of chemo and radiation. He reports that he is doing well overall. He is taking Xeloda 3 tablets BID without nausea. He states that he is using imodium to have less bowel movements and he has bowel movements intermittently throughout the day.     On review of systems, pt denies rectal pain, rectal irritation, abdominal pain, and nausea. He reports that his nails and toenails are changing after chemo use. He also have dark spots to his bilateral hands.       REVIEW OF SYSTEMS:   Constitutional: Denies fevers, chills or abnormal night sweats  (+) improved energy Eyes: Denies blurriness of vision, double vision or watery eyes Ears, nose, mouth, throat, and face: Denies mucositis or sore throat Respiratory: Denies cough, dyspnea or wheezes (+) occasional SOB Cardiovascular: Denies palpitation, chest discomfort or lower extremity swelling Gastrointestinal:  Denies nausea, heartburn (+) Loose stool, moderately controlled with imodium  Skin: (+) black spots to bilateral hands. (+) blackened nails.  Lymphatics: Denies new lymphadenopathy or easy bruising Neurological:Denies numbness, tingling or new weaknesses Behavioral/Psych: Mood is stable, no new changes  All other systems were reviewed with the patient and are negative.   MEDICAL HISTORY:  Past Medical History:  Diagnosis Date  . [place]   Rectal cvancer    SURGICAL HISTORY: No past surgical history on file.  SOCIAL HISTORY: Social History   Socioeconomic History  . Marital status: Married    Spouse name: Not on file  . Number of children: Not on file  . Years of education: Not on file  . Highest education level:  Not on file  Social Needs  . Financial resource strain: Not on file  . Food insecurity - worry: Not on file  . Food insecurity - inability: Not on file  . Transportation needs - medical: Not on file  . Transportation needs - non-medical: Not on file  Occupational History  . Not on file  Tobacco Use  . Smoking status: Former Smoker    Packs/day: 0.50    Years: 10.00    Pack years: 5.00    Last attempt to quit: 1988    Years since quitting: 31.0  . Smokeless tobacco: Never Used  Substance and Sexual Activity  . Alcohol use: Yes    Comment: ocassionally   . Drug use: No  . Sexual activity: Not on file  Other Topics Concern  . Not on file  Social History Narrative  . Not on file    FAMILY HISTORY: Family History  Problem Relation Age of Onset  . Cancer Mother        cancer (unknown type)    ALLERGIES:  has No Known Allergies.  MEDICATIONS:  Current Outpatient Medications  Medication Sig Dispense Refill  . capecitabine (XELODA) 500 MG tablet Take 3 tablets (150026mby  mouth 2 times daily, within 30 minutes of food, on radiation days only, M-F 180 tablet 0  . finasteride (PROSCAR) 5 MG tablet Take 5 mg by mouth.    . lovastatin (MEVACOR) 40 MG tablet Take 40 mg by mouth.    . nitrofurantoin, macrocrystal-monohydrate, (MACROBID) 100 MG capsule Take 1 capsule (100 mg total) by mouth 2 (two) times daily. 10 capsule 0   No current facility-administered medications for this visit.    PHYSICAL EXAMINATION:  ECOG PERFORMANCE STATUS: 1 Vitals:   12/07/17 0855  BP: (!) 167/73  Pulse: 84  Resp: 17  Temp: 98 F (36.7 C)  TempSrc: Oral  SpO2: 100%  Weight: 161 lb 12.8 oz (73.4 kg)  Height: 5' 10" (1.778 m)    GENERAL:alert, no distress and comfortable SKIN: skin color, texture, turgor are normal, no rashes or significant lesions EYES: normal, conjunctiva are pink and non-injected, sclera clear OROPHARYNX:no exudate, no erythema and lips, buccal mucosa, and tongue normal   NECK: supple, thyroid normal size, non-tender, without nodularity LYMPH:  no palpable lymphadenopathy in the cervical, axillary or inguinal LUNGS: clear to auscultation and percussion with normal breathing effort HEART: regular rate & rhythm and no murmurs and no lower extremity edema ABDOMEN:abdomen soft, non-tender and normal bowel sounds, rectal exam was deferred today  Musculoskeletal:no cyanosis of digits and no clubbing  PSYCH: alert & oriented x 3 with fluent speech NEURO: no focal motor/sensory deficits  LABORATORY DATA:  I have reviewed the data as listed CBC Latest Ref Rng & Units 12/07/2017 11/30/2017 11/19/2017  WBC 4.0 - 10.3 10e3/uL 6.5 6.7 5.2  Hemoglobin 13.0 - 17.1 g/dL 12.9(L) 12.5(L) 13.0  Hematocrit 38.4 - 49.9 % 39.5 38.8 41.1  Platelets 140 - 400 10e3/uL 156 137(L) 169    CMP Latest Ref Rng & Units 12/07/2017 11/30/2017 11/19/2017  Glucose 70 - 140 mg/dl 105 102 109  BUN 7.0 - 26.0 mg/dL 15.7 15.9 15.4  Creatinine 0.7 - 1.3 mg/dL 1.1 1.2 1.2  Sodium 136 - 145 mEq/L 141 141 141  Potassium 3.5 - 5.1 mEq/L 3.7 3.7 3.7  CO2 22 - 29 mEq/L _0 Calcium 8.4 - 10.4 mg/dL 8.8 8.9 9.2  Total Protein 6.4 - 8.3 g/dL 6.3(L) 6.3(L) 6.7  Total Bilirubin 0.20 - 1.20 mg/dL 0.54 0.55 0.61  Alkaline Phos 40 - 150 U/L 58 55 61  AST 5 - 34 U/L _1 ALT 0 - 55 U/L _2 PATHOLOGY   FINAL DIAGNOSIS 09/29/17  1. LG Intestine - Ascending Colon Polyp:  TUBULAR ADENOMA 2. LG Intestine - Rectum, Bx: INVASIVE MODERATELY DIFFERENTIATED ADENOCARCINOMA. THE MSI TESTING IS PENDING AND THE RESULT WILL BE REPORTED AS AN ADDENDUM.    PROCEDURES   Colonoscopy by Dr. Penelope Coop 09/29/17  Findings:  The perianal and digital rectal examination were normal.  A non-obstructing large mass was found in the proximal rectum and in the mid rectum.  The mass was circumferential.  This was biopsied with a cold forceps for histology.  Area was tattooed with an injection of Niger  ink both distal and proximal to the mass.  There is about 4-5 cm of rectal mucosa distal to the mass that looks norma. This mass is about 7-8 cm in length.  A 10 mm polyp was found in the ascending colon. The polyp was sessile. the polyp was removed with a hot snare.  Resection and retrieval were complete.     RADIOGRAPHIC STUDIES:  I have personally reviewed the radiological images as listed and agreed with the findings in the report. Nm Pet Image Initial (pi) Skull Base To Thigh  Result Date: 11/11/2017 CLINICAL DATA:  Initial treatment strategy for rectal cancer. Pulmonary nodules. EXAM: NUCLEAR MEDICINE PET SKULL BASE TO THIGH TECHNIQUE: 8.11 mCi F-18 FDG was injected intravenously. Full-ring PET imaging was performed from the skull base to thigh after the radiotracer. CT data was obtained and used for attenuation correction and anatomic localization. FASTING BLOOD GLUCOSE:  Value: 82 mg/dl COMPARISON:  CT chest abdomen pelvis dated 10/28/2017. FINDINGS: NECK: No hypermetabolic cervical lymphadenopathy. CHEST: Bilateral pulmonary nodules, including: --15 mm partially cavitary nodule in the posterior left upper lobe (series 8/ image 22), max SUV 8.0 --13 mm nodule in the anterior right middle lobe (series 8/ image 44), max SUV 3.7 --2.3 cm nodule in the posterior right lung base (series 8/image 45), max SUV 6.0 No hypermetabolic thoracic lymphadenopathy. ABDOMEN/PELVIS: Wall thickening/ mass involving the upper rectum (series 4/image 60), corresponding to the patient's known rectal adenocarcinoma. No hypermetabolic abdominopelvic lymphadenopathy. No suspicious hypermetabolism in the liver, spleen, pancreas, or adrenal glands. Bilateral renal cysts, measuring up to 6.0 cm in the posterior right upper kidney (series 4/ image 109). 11 mm right upper pole renal calculus (series 4/image 105), without hydronephrosis. Multiple large bladder calculi measuring up to 2.4 cm (series 4/ image 162). prostatomegaly,  suggesting BPH. Atherosclerotic calcifications the abdominal aorta and branch vessels. SKELETON: No focal hypermetabolic activity to suggest skeletal metastasis. Degenerative changes of the visualized thoracolumbar spine. IMPRESSION: Wall thickening/ mass involving the upper rectum, corresponding to the patient's known rectal adenocarcinoma. Bilateral pulmonary nodules, partially cavitary in the left upper lobe nodule, suspicious for metastases. Notably, cavitary metastases are more common with squamous cell carcinoma rather than the patient's known adenocarcinoma. Consider tissue sampling as clinically warranted. Electronically Signed   By: Julian Hy M.D.   On: 11/11/2017 12:58    ASSESSMENT & PLAN:  HEITOR STEINHOFF is a 82 y.o. male without a significant past medical history, presented with iron deficient anemia.  He underwent a colonoscopy which showed a non-obstructive mass from proximal rectum to mid rectum.   1. Rectal Cancer, invasive adenocarcinoma, [id number] -I previously reviewed his colonoscopy and biopsy pathology results in great details with patient  -We discussed his CT Scan from 10/28/17 which shows multiple lung nodules, largest 2cm in RLL, which is very concerning for metastasis. I recommend a lung biopsy to rule out possible lung primary and to determine staging. I explained the risks of lung biopsy. After a lengthy discussion, he states he values his quality of life much more than quantity of his life, and would like to wait on his lung biopsy.  -I previously reviewed his PET scan images in person with pt, his bilateral lung lesions I hypermetabolic on the PET scan, no significant thoracic adenopathy, this is likely metastatic disease from his rectal cancer, also primary lung cancer is not completely ruled out.  I again strongly encouraged him to consider biopsy if he would like to further treatment after he completes chemo and radiation.  He will considered after radiation. -He  has started concurrent chemoradiation, tolerating very well so far.  He still has moderate diarrhea, taking Imodium.  No other complaints. -12/07/2017 labs reviewed, CMP WNL, anemia resolved. His CEA is still pending from today.  -advised to continue Radiation treatment to completion on 12/18/2017.  -we discussed lung mass biopsy again today but pt declined. He is  agreeable to continue following up with [place]  -f/u in 3 weeks    2. Anemia, iron deficient  -10/22/17 labs showed anemia with hemoglobin 8.0, MCV 64.9, normal WBC and platelet, this is likely iron deficient anemia, secondary to his rectal cancer bleeding. -He received IV Feraheme 11/26 and again on 11/30.  -His 11/02/17 Hg was 7.3, I suggest he needs a blood transfusion in the next few days. He agreed.  -He has received 2 dose of Feraheme on 11/02/17 and 11/09/17. He Hg has responded well and has resolved now at 12 (11/19/17) -His Hg is at 12.9 as of 12/07/2017.  -He told him he is fine to restart oral iron, but he does not want to restart at this time.   3. Lung masses  -Likely metastatic disease from his rectal cancer, also primary lung cancer with metastasis is also possible. -Patient declined lung biopsy now -11/11/17 PET scan shows uptake in bilateral lung masses. I previously recommended a lung biopsy for definitve diagnosis. He declined for now   4. Goal of care discussion  -We again discussed the incurable nature of his cancer, and the overall poor prognosis, especially if he does not have good response to radiation, chemotherapy or progress on chemo -The patient understands the goal of care is palliative. -I previously recommended DNR/DNI, he will think about it    PLAN: -continue radiation and Xeloda, he will complete next Friday  -lab and fu in 3 weeks     No orders of the defined types were placed in this encounter.   All questions were answered. The patient knows to call the clinic with any problems, questions or  concerns.  I spent 15 minutes counseling the patient face to face. The total time spent in the appointment was 20 minutes and more than 50% was on counseling.     Truitt Merle, MD 12/07/2017   This document serves as a record of services personally performed by Truitt Merle, MD. It was created on her behalf by Steva Colder, a trained medical scribe. The creation of this record is based on the scribe's personal observations and the provider's statements to them.   I have reviewed the above documentation for accuracy and completeness, and I agree with the above.

## 2017-12-04 ENCOUNTER — Ambulatory Visit
Admission: RE | Admit: 2017-12-04 | Discharge: 2017-12-04 | Disposition: A | Discharge status: Home / Self Care | Payer: Medicare Other | Source: Ambulatory Visit | Attending: Radiation Oncology | Admitting: Radiation Oncology

## 2017-12-04 DIAGNOSIS — Z510 Encounter for antineoplastic radiation therapy: Secondary | ICD-10-CM | POA: Diagnosis not present

## 2017-12-07 ENCOUNTER — Other Ambulatory Visit (HOSPITAL_BASED_OUTPATIENT_CLINIC_OR_DEPARTMENT_OTHER): Payer: Medicare Other

## 2017-12-07 ENCOUNTER — Telehealth: Payer: Self-pay | Admitting: Hematology

## 2017-12-07 ENCOUNTER — Ambulatory Visit
Admission: RE | Admit: 2017-12-07 | Discharge: 2017-12-07 | Disposition: A | Discharge status: Home / Self Care | Payer: Medicare Other | Source: Ambulatory Visit | Attending: Radiation Oncology | Admitting: Radiation Oncology

## 2017-12-07 ENCOUNTER — Encounter: Payer: Self-pay | Admitting: Hematology

## 2017-12-07 ENCOUNTER — Ambulatory Visit (HOSPITAL_BASED_OUTPATIENT_CLINIC_OR_DEPARTMENT_OTHER): Payer: Medicare Other | Admitting: Hematology

## 2017-12-07 VITALS — BP 167/73 | HR 84 | Temp 98.0°F | Resp 17 | Ht 70.0 in | Wt 161.8 lb

## 2017-12-07 DIAGNOSIS — R918 Other nonspecific abnormal finding of lung field: Secondary | ICD-10-CM

## 2017-12-07 DIAGNOSIS — D509 Iron deficiency anemia, unspecified: Secondary | ICD-10-CM

## 2017-12-07 DIAGNOSIS — C20 Malignant neoplasm of rectum: Secondary | ICD-10-CM

## 2017-12-07 DIAGNOSIS — Z510 Encounter for antineoplastic radiation therapy: Secondary | ICD-10-CM | POA: Diagnosis not present

## 2017-12-07 DIAGNOSIS — D500 Iron deficiency anemia secondary to blood loss (chronic): Secondary | ICD-10-CM

## 2017-12-07 LAB — COMPREHENSIVE METABOLIC PANEL
ALBUMIN: 3.2 g/dL — AB (ref 3.5–5.0)
ALK PHOS: 58 U/L (ref 40–150)
ALT: 18 U/L (ref 0–55)
ANION GAP: 8 meq/L (ref 3–11)
AST: 16 U/L (ref 5–34)
BUN: 15.7 mg/dL (ref 7.0–26.0)
CALCIUM: 8.8 mg/dL (ref 8.4–10.4)
CO2: 25 mEq/L (ref 22–29)
Chloride: 109 mEq/L (ref 98–109)
Creatinine: 1.1 mg/dL (ref 0.7–1.3)
EGFR: >60 mL/min/{1.73_m2} (ref >60)
Glucose: 105 mg/dl (ref 70–140)
POTASSIUM: 3.7 meq/L (ref 3.5–5.1)
Sodium: 141 mEq/L (ref 136–145)
Total Bilirubin: 0.54 mg/dL (ref 0.20–1.20)
Total Protein: 6.3 g/dL — ABNORMAL LOW (ref 6.4–8.3)

## 2017-12-07 LAB — CBC WITH DIFFERENTIAL/PLATELET
BASO%: 0.5 % (ref 0.0–2.0)
Basophils Absolute: 0 10*3/uL (ref 0.0–0.1)
EOS ABS: 0.2 10*3/uL (ref 0.0–0.5)
EOS%: 3.1 % (ref 0.0–7.0)
HCT: 39.5 % (ref 38.4–49.9)
HGB: 12.9 g/dL — ABNORMAL LOW (ref 13.0–17.1)
LYMPH#: 0.4 10*3/uL — AB (ref 0.9–3.3)
LYMPH%: 6.2 % — ABNORMAL LOW (ref 14.0–49.0)
MCH: 26.2 pg — ABNORMAL LOW (ref 27.2–33.4)
MCHC: 32.7 g/dL (ref 32.0–36.0)
MCV: 80.1 fL (ref 79.3–98.0)
MONO#: 0.9 10*3/uL (ref 0.1–0.9)
MONO%: 13.5 % (ref 0.0–14.0)
NEUT#: 5 10*3/uL (ref 1.5–6.5)
NEUT%: 76.7 % — ABNORMAL HIGH (ref 39.0–75.0)
NRBC: 0 % (ref 0–0)
PLATELETS: 156 10*3/uL (ref 140–400)
RBC: 4.93 10*6/uL (ref 4.20–5.82)
WBC: 6.5 10*3/uL (ref 4.0–10.3)

## 2017-12-07 LAB — CEA (IN HOUSE-CHCC): CEA (CHCC-IN HOUSE): 7.63 ng/mL — AB (ref 0.00–5.00)

## 2017-12-07 NOTE — Telephone Encounter (Signed)
Mailed calendar to patient of upcoming January appointments.

## 2017-12-08 ENCOUNTER — Encounter: Payer: Self-pay | Admitting: Hematology

## 2017-12-09 ENCOUNTER — Ambulatory Visit
Admission: RE | Admit: 2017-12-09 | Discharge: 2017-12-09 | Disposition: A | Discharge status: Home / Self Care | Payer: Medicare Other | Source: Ambulatory Visit | Attending: Radiation Oncology | Admitting: Radiation Oncology

## 2017-12-09 DIAGNOSIS — Z510 Encounter for antineoplastic radiation therapy: Secondary | ICD-10-CM | POA: Diagnosis not present

## 2017-12-10 ENCOUNTER — Ambulatory Visit
Admission: RE | Admit: 2017-12-10 | Discharge: 2017-12-10 | Disposition: A | Discharge status: Home / Self Care | Payer: Medicare Other | Source: Ambulatory Visit | Attending: Radiation Oncology | Admitting: Radiation Oncology

## 2017-12-10 DIAGNOSIS — Z510 Encounter for antineoplastic radiation therapy: Secondary | ICD-10-CM | POA: Diagnosis not present

## 2017-12-11 ENCOUNTER — Ambulatory Visit
Admission: RE | Admit: 2017-12-11 | Discharge: 2017-12-11 | Disposition: A | Discharge status: Home / Self Care | Payer: Medicare Other | Source: Ambulatory Visit | Attending: Radiation Oncology | Admitting: Radiation Oncology

## 2017-12-11 DIAGNOSIS — Z510 Encounter for antineoplastic radiation therapy: Secondary | ICD-10-CM | POA: Diagnosis not present

## 2017-12-14 ENCOUNTER — Ambulatory Visit: Payer: Medicare Other | Admitting: Nurse Practitioner

## 2017-12-14 ENCOUNTER — Ambulatory Visit
Admission: RE | Admit: 2017-12-14 | Discharge: 2017-12-14 | Disposition: A | Discharge status: Home / Self Care | Payer: Medicare Other | Source: Ambulatory Visit | Attending: Radiation Oncology | Admitting: Radiation Oncology

## 2017-12-14 ENCOUNTER — Other Ambulatory Visit: Payer: Medicare Other

## 2017-12-14 ENCOUNTER — Ambulatory Visit: Payer: Medicare Other

## 2017-12-14 DIAGNOSIS — Z510 Encounter for antineoplastic radiation therapy: Secondary | ICD-10-CM | POA: Diagnosis not present

## 2017-12-15 ENCOUNTER — Ambulatory Visit
Admission: RE | Admit: 2017-12-15 | Discharge: 2017-12-15 | Disposition: A | Discharge status: Home / Self Care | Payer: Medicare Other | Source: Ambulatory Visit | Attending: Radiation Oncology | Admitting: Radiation Oncology

## 2017-12-15 ENCOUNTER — Ambulatory Visit: Payer: Medicare Other

## 2017-12-15 DIAGNOSIS — Z510 Encounter for antineoplastic radiation therapy: Secondary | ICD-10-CM | POA: Diagnosis not present

## 2017-12-16 ENCOUNTER — Ambulatory Visit: Payer: Medicare Other

## 2017-12-16 ENCOUNTER — Ambulatory Visit
Admission: RE | Admit: 2017-12-16 | Discharge: 2017-12-16 | Disposition: A | Discharge status: Home / Self Care | Payer: Medicare Other | Source: Ambulatory Visit | Attending: Radiation Oncology | Admitting: Radiation Oncology

## 2017-12-16 DIAGNOSIS — Z510 Encounter for antineoplastic radiation therapy: Secondary | ICD-10-CM | POA: Diagnosis not present

## 2017-12-17 ENCOUNTER — Ambulatory Visit
Admission: RE | Admit: 2017-12-17 | Discharge: 2017-12-17 | Disposition: A | Discharge status: Home / Self Care | Payer: Medicare Other | Source: Ambulatory Visit | Attending: Radiation Oncology | Admitting: Radiation Oncology

## 2017-12-17 ENCOUNTER — Ambulatory Visit: Payer: Medicare Other

## 2017-12-17 DIAGNOSIS — Z510 Encounter for antineoplastic radiation therapy: Secondary | ICD-10-CM | POA: Diagnosis not present

## 2017-12-18 ENCOUNTER — Ambulatory Visit
Admission: RE | Admit: 2017-12-18 | Discharge: 2017-12-18 | Disposition: A | Discharge status: Home / Self Care | Payer: Medicare Other | Source: Ambulatory Visit | Attending: Radiation Oncology | Admitting: Radiation Oncology

## 2017-12-18 ENCOUNTER — Encounter: Payer: Self-pay | Admitting: Radiation Oncology

## 2017-12-18 DIAGNOSIS — Z510 Encounter for antineoplastic radiation therapy: Secondary | ICD-10-CM | POA: Diagnosis not present

## 2017-12-24 NOTE — Progress Notes (Signed)
  Radiation Oncology         [phone] ________________________________  Name: Adrian Banks MRN: [id number]  Date: 12/18/2017  DOB: May 02, 1932  End of Treatment Note  Diagnosis:   84 y.o. male with adenocarcinoma of the rectum  Indication for treatment::  curative       Radiation treatment dates:   11/05/2017 - 12/18/2017  Site/dose:   The rectum was initially treated to 45 Gy in 25 fractions, followed by a 5.4 Gy boost delivered in 3 fractions to yield a total dose of 50.4 Gy using a 3-D technique.  Narrative: The patient tolerated radiation treatment relatively well.   He experienced mild fatigue and some rectal irritation. He reported having diarrhea about 3 times per day that was managed with Imodium. He also reported an acute episode of rectal bleeding that was dark red.  Plan: The patient has completed radiation treatment and may now be scheduled for surgical resection. The patient will return to radiation oncology clinic for routine followup in one month. I advised the patient to call or return sooner if they have any questions or concerns related to their recovery or treatment. ________________________________  Jodelle Gross, MD, PhD  This document serves as a record of services personally performed by Kyung Rudd, MD. It was created on his behalf by Rae Lips, a trained medical scribe. The creation of this record is based on the scribe's personal observations and the provider's statements to them. This document has been checked and approved by the attending provider.

## 2017-12-25 NOTE — Progress Notes (Deleted)
[place]  Telephone:(336) [phone] Fax:(336) [phone]  Clinic Follow up Note   [name] 12/25/2017  CHIEF COMPLAINT: Follow up rectal cancer   SUMMARY OF ONCOLOGIC HISTORY:   Rectal cancer ([place])   09/29/2017 Procedure    Colonoscopy by Dr. Penelope Coop 09/29/17  Findings:  The perianal and digital rectal examination were normal.  A non-obstructing large mass was found in the proximal rectum and in the mid rectum.  The mass was circumferential.  This was biopsied with a cold forceps for histology.  Area was tattooed with an injection of Niger ink both distal and proximal to the mass.  There is about 4-5 cm of rectal mucosa distal to the mass that looks norma. This mass is about 7-8 cm in length.  A 10 mm polyp was found in the ascending colon. The polyp was sessile. the polyp was removed with a hot snare.  Resection and retrieval were complete.        09/29/2017 Initial Biopsy    FINAL DIAGNOSIS 09/29/17  1. LG Intestine - Ascending Colon Polyp:  TUBULAR ADENOMA 2. LG Intestine - Rectum, Bx: INVASIVE MODERATELY DIFFERENTIATED ADENOCARCINOMA. THE MSI TESTING IS PENDING AND THE RESULT WILL BE REPORTED AS AN ADDENDUM.      10/21/2017 Initial Diagnosis    Rectal cancer ([place])      10/28/2017 Imaging    CT CAP W Contrast  IMPRESSION: 1. Long segment circumferential wall thickening of the rectosigmoid colon consistent with known adenocarcinoma. No evidence of bowel obstruction or perforation. 2. No definite signs of metastatic disease in the abdomen or pelvis. Soft tissue nodularity in the right pelvis is probably due to an unopacified pelvic vein. 3. Multiple pulmonary nodules, some cavitary. These are not typical of metastatic adenocarcinoma (especially without evidence of abdominal disease). Consider multicentric or metastatic lung cancer. PET-CT may be helpful to assess hypermetabolic  activity and guided tissue sampling. 4. Multiple bladder calculi consistent with chronic bladder outlet obstruction from prostatomegaly. Nonobstructing right renal calculus. No hydronephrosis. 5. Aortic Atherosclerosis (ICD10-I70.0). Additional incidental findings including a moderate size hiatal hernia and renal cysts.       11/05/2017 -  Radiation Therapy    Radiation with Dr. Lisbeth Renshaw With Xeloda, plan to start 11/05/17      11/09/2017 -  Chemotherapy    Chemoradiation with Xeloda 848m/m2 q12h, M-F on days of radiation plan to starting 11/09/17      11/11/2017 Imaging    PET Scan  IMPRESSION:  Wall thickening/ mass involving the upper rectum, corresponding to the patient's known rectal adenocarcinoma. Bilateral pulmonary nodules, partially cavitary in the left upper lobe nodule, suspicious for metastases. Notably, cavitary metastases are more common with squamous cell carcinoma rather than the patient's known adenocarcinoma.      CURRENT THERAPY: Radiation starting 11/05/17 and with Xeloda 15051m( 82542m2) q12h M-F on days of radiation starting 11/09/17  INTERVAL HISTORY: Please see below for problem oriented charting.  REVIEW OF SYSTEMS:   Constitutional: Denies fevers, chills or abnormal weight loss Eyes: Denies blurriness of vision Ears, nose, mouth, throat, and face: Denies mucositis or sore throat Respiratory: Denies cough, dyspnea or wheezes Cardiovascular: Denies palpitation, chest discomfort or lower extremity swelling Gastrointestinal:  Denies nausea, heartburn or change in bowel habits Skin: Denies abnormal skin rashes Lymphatics: Denies new lymphadenopathy or easy bruising Neurological:Denies numbness, tingling or new weaknesses Behavioral/Psych: Mood is stable, no new changes  All other systems  were reviewed with the patient and are negative.  MEDICAL HISTORY:  Past Medical History:  Diagnosis Date  . Cancer ([place]) 09/29/2017   Rectal cvancer    SURGICAL  HISTORY: No past surgical history on file.  I have reviewed the social history and family history with the patient and they are unchanged from previous note.  ALLERGIES:  has No Known Allergies.  MEDICATIONS:  Current Outpatient Medications  Medication Sig Dispense Refill  . capecitabine (XELODA) 500 MG tablet Take 3 tablets (1534m) by mouth 2 times daily, within 30 minutes of food, on radiation days only, M-F 180 tablet 0  . finasteride (PROSCAR) 5 MG tablet Take 5 mg by mouth.    . lovastatin (MEVACOR) 40 MG tablet Take 40 mg by mouth.    . nitrofurantoin, macrocrystal-monohydrate, (MACROBID) 100 MG capsule Take 1 capsule (100 mg total) by mouth 2 (two) times daily. 10 capsule 0   No current facility-administered medications for this visit.     PHYSICAL EXAMINATION: ECOG PERFORMANCE STATUS: {CHL ONC ECOG PS:[phone]}  There were no vitals filed for this visit. There were no vitals filed for this visit.  GENERAL:alert, no distress and comfortable SKIN: skin color, texture, turgor are normal, no rashes or significant lesions EYES: normal, Conjunctiva are pink and non-injected, sclera clear OROPHARYNX:no exudate, no erythema and lips, buccal mucosa, and tongue normal  NECK: supple, thyroid normal size, non-tender, without nodularity LYMPH:  no palpable lymphadenopathy in the cervical, axillary or inguinal LUNGS: clear to auscultation and percussion with normal breathing effort HEART: regular rate & rhythm and no murmurs and no lower extremity edema ABDOMEN:abdomen soft, non-tender and normal bowel sounds Musculoskeletal:no cyanosis of digits and no clubbing  NEURO: alert & oriented x 3 with fluent speech, no focal motor/sensory deficits  LABORATORY DATA:  I have reviewed the data as listed CBC Latest Ref Rng & Units 12/07/2017 11/30/2017 11/19/2017  WBC 4.0 - 10.3 10e3/uL 6.5 6.7 5.2  Hemoglobin 13.0 - 17.1 g/dL 12.9(L) 12.5(L) 13.0  Hematocrit 38.4 - 49.9 % 39.5 38.8 41.1    Platelets 140 - 400 10e3/uL 156 137(L) 169     CMP Latest Ref Rng & Units 12/07/2017 11/30/2017 11/19/2017  Glucose 70 - 140 mg/dl 105 102 109  BUN 7.0 - 26.0 mg/dL 15.7 15.9 15.4  Creatinine 0.7 - 1.3 mg/dL 1.1 1.2 1.2  Sodium 136 - 145 mEq/L 141 141 141  Potassium 3.5 - 5.1 mEq/L 3.7 3.7 3.7  CO2 22 - 29 mEq/L _0 Calcium 8.4 - 10.4 mg/dL 8.8 8.9 9.2  Total Protein 6.4 - 8.3 g/dL 6.3(L) 6.3(L) 6.7  Total Bilirubin 0.20 - 1.20 mg/dL 0.54 0.55 0.61  Alkaline Phos 40 - 150 U/L 58 55 61  AST 5 - 34 U/L _1 ALT 0 - 55 U/L _2 PATHOLOGY   FINAL DIAGNOSIS 09/29/17  1. LG Intestine - Ascending Colon Polyp:  TUBULAR ADENOMA 2. LG Intestine - Rectum, Bx: INVASIVE MODERATELY DIFFERENTIATED ADENOCARCINOMA. THE MSI TESTING IS PENDING AND THE RESULT WILL BE REPORTED AS AN ADDENDUM.   PROCEDURES   Colonoscopy by Dr. GPenelope Coop10/23/18  Findings:  The perianal and digital rectal examination were normal.  A non-obstructing large mass was found in the proximal rectum and in the mid rectum.  The mass was circumferential.  This was biopsied with a cold forceps for histology.  Area was tattooed with an injection of INigerink both distal and proximal to the mass.  There is about 4-5 cm of rectal mucosa distal to the mass that looks norma. This mass is about 7-8 cm in length.  A 10 mm polyp was found in the ascending colon. The polyp was sessile. the polyp was removed with a hot snare.  Resection and retrieval were complete.      RADIOGRAPHIC STUDIES: I have personally reviewed the radiological images as listed and agreed with the findings in the report. No results found.   ASSESSMENT & PLAN: Adrian Banks is a 84 y.o. male without a significant past medical history, presented with iron deficient anemia.  He underwent a colonoscopy which showed a non-obstructive mass from proximal rectum to mid rectum.   1. Rectal Cancer, invasive adenocarcinoma, TxNxM1 2.  Anemia, iron deficient 3. Lung masses 4. Goals of care discussion  PLAN No problem-specific Assessment & Plan notes found for this encounter.   No orders of the defined types were placed in this encounter.  All questions were answered. The patient knows to call the clinic with any problems, questions or concerns. No barriers to learning was detected. I spent {CHL ONC TIME VISIT - PPHKF:[id number]} counseling the patient face to face. The total time spent in the appointment was {CHL ONC TIME VISIT - VFMBB:[id number]} and more than 50% was on counseling and review of test results     Alla Feeling, NP 12/25/17

## 2017-12-28 ENCOUNTER — Other Ambulatory Visit: Payer: Medicare Other

## 2017-12-28 ENCOUNTER — Ambulatory Visit: Payer: Medicare Other | Admitting: Nurse Practitioner

## 2017-12-30 ENCOUNTER — Telehealth: Payer: Self-pay | Admitting: Hematology

## 2017-12-30 NOTE — Telephone Encounter (Signed)
Scheduled appts per 1/23 sch msg - spoke with patient regarding appts that were added.

## 2018-01-18 ENCOUNTER — Encounter: Payer: Self-pay | Admitting: *Deleted

## 2018-01-18 ENCOUNTER — Telehealth: Payer: Self-pay | Admitting: *Deleted

## 2018-01-18 NOTE — Progress Notes (Signed)
1615 Spoke with sean at [facility] will call patient and schedule a follow up appointment with Dr. Leighton Ruff  post radiation per Dr. Lisbeth Renshaw and [place].

## 2018-01-19 NOTE — Progress Notes (Signed)
[place]  Telephone:(336) [phone] Fax:(336) [phone]  Clinic Follow Up Note   Patient Care Team: Adrian Banks as PCP - General (Family Medicine)   Date of Service: 01/21/2018   CHIEF COMPLAINTS:  Follow up rectal Cancer    Rectal cancer ([place])   09/29/2017 Procedure    Colonoscopy by Dr. Penelope Banks 09/29/17  Findings:  The perianal and digital rectal examination were normal.  A non-obstructing large mass was found in the proximal rectum and in the mid rectum.  The mass was circumferential.  This was biopsied with a cold forceps for histology.  Area was tattooed with an injection of Niger ink both distal and proximal to the mass.  There is about 4-5 cm of rectal mucosa distal to the mass that looks norma. This mass is about 7-8 cm in length.  A 10 mm polyp was found in the ascending colon. The polyp was sessile. the polyp was removed with a hot snare.  Resection and retrieval were complete.        09/29/2017 Initial Biopsy    FINAL DIAGNOSIS 09/29/17  1. LG Intestine - Ascending Colon Polyp:  TUBULAR ADENOMA 2. LG Intestine - Rectum, Bx: INVASIVE MODERATELY DIFFERENTIATED ADENOCARCINOMA. THE MSI TESTING IS PENDING AND THE RESULT WILL BE REPORTED AS AN ADDENDUM.      10/21/2017 Initial Diagnosis    Rectal cancer ([place])      10/28/2017 Imaging    CT CAP W Contrast  IMPRESSION: 1. Long segment circumferential wall thickening of the rectosigmoid colon consistent with known adenocarcinoma. No evidence of bowel obstruction or perforation. 2. No definite signs of metastatic disease in the abdomen or pelvis. Soft tissue nodularity in the right pelvis is probably due to an unopacified pelvic vein. 3. Multiple pulmonary nodules, some cavitary. These are not typical of metastatic adenocarcinoma (especially without evidence of abdominal disease). Consider multicentric or metastatic lung cancer. PET-CT may be helpful to assess hypermetabolic activity  and guided tissue sampling. 4. Multiple bladder calculi consistent with chronic bladder outlet obstruction from prostatomegaly. Nonobstructing right renal calculus. No hydronephrosis. 5. Aortic Atherosclerosis (ICD10-I70.0). Additional incidental findings including a moderate size hiatal hernia and renal cysts.       11/05/2017 - 12/18/2017 Radiation Therapy    Radiation with Adrian Banks With Xeloda      11/09/2017 - 12/18/2017 Chemotherapy    Chemoradiation with Xeloda 862m/m2 q12h, M-F on days of radiation plan to starting 11/09/17      11/11/2017 Imaging    PET Scan  IMPRESSION:  Wall thickening/ mass involving the upper rectum, corresponding to the patient's known rectal adenocarcinoma. Bilateral pulmonary nodules, partially cavitary in the left upper lobe nodule, suspicious for metastases. Notably, cavitary metastases are more common with squamous cell carcinoma rather than the patient's known adenocarcinoma.         HISTORY OF PRESENTING ILLNESS: 10/22/17  [name] is here because of newly diagnosed rectal cancer. He was referred by Adrian Banks He presents to the clinic today by himself.   Pt is a poor historian. He denies any significant abdominal discomfort, nausea, bloating, rectal bleeding or change in bowel habits.  He was referred to GI doctor Adrian Banks for colonoscopy by his PCP due to deficient anemia, which showed a nonobstructing mass in the proximal rectum to mid rectum, biopsy showed invasive adenocarcinoma.  He was referred to colorectal surgeon Adrian Banks who recommended surgical resection.  Adrian Mooresreferred patient to [place] to Dr. MLisbeth Banks  for evaluation of new adjuvant chemoradiation.  Adrian Banks has ordered staging CT chest, abdomen and pelvis, and pelvic MRI for staging these scans has not been scheduled yet.   He has had some dysuria in the past few months, was seen by his PCP and diagnosed with BPH. He started Proscar.  He is married, lives with his  wife, daughter independent and functions well.   CURRENT THERAPY:  Pending Surgery   INTERVAL HISTORY:  Adrian Banks is here for a follow up post concurrent chemoradiation. He presents in the clinic today noting he is doing better since completing chemoradiation. He has improved diarrhea since being on imodium. He has not seen Dr. Marcello Banks yet. He does not have an appointment with her. He denies any other concerns.    On review of symptoms, pt notes improved diarrhea with imodium. His appetite and energy is good.     REVIEW OF SYSTEMS:   Constitutional: Denies fevers, chills or abnormal night sweats  Eyes: Denies blurriness of vision, double vision or watery eyes Ears, nose, mouth, throat, and face: Denies mucositis or sore throat Respiratory: Denies cough, dyspnea or wheezes (+) occasional SOB Cardiovascular: Denies palpitation, chest discomfort or lower extremity swelling Gastrointestinal:  Denies nausea, heartburn (+) Diarrhea, controlled with imodium  Skin: (+) black spots to bilateral hands. (+) blackened nails.  Lymphatics: Denies new lymphadenopathy or easy bruising Neurological:Denies numbness, tingling or new weaknesses Behavioral/Psych: Mood is stable, no new changes  All other systems were reviewed with the patient and are negative.   MEDICAL HISTORY:  Past Medical History:  Diagnosis Date  . Cancer ([place]) 09/29/2017   Rectal cvancer    SURGICAL HISTORY: History reviewed. No pertinent surgical history.  SOCIAL HISTORY: Social History   Socioeconomic History  . Marital status: Married    Spouse name: Not on file  . Number of children: Not on file  . Years of education: Not on file  . Highest education level: Not on file  Social Needs  . Financial resource strain: Not on file  . Food insecurity - worry: Not on file  . Food insecurity - inability: Not on file  . Transportation needs - medical: Not on file  . Transportation needs - non-medical: Not on file    Occupational History  . Not on file  Tobacco Use  . Smoking status: Former Smoker    Packs/day: 0.50    Years: 10.00    Pack years: 5.00    Last attempt to quit: 1988    Years since quitting: 31.1  . Smokeless tobacco: Never Used  Substance and Sexual Activity  . Alcohol use: Yes    Comment: ocassionally   . Drug use: No  . Sexual activity: Not on file  Other Topics Concern  . Not on file  Social History Narrative  . Not on file    FAMILY HISTORY: Family History  Problem Relation Age of Onset  . Cancer Mother        cancer (unknown type)    ALLERGIES:  has No Known Allergies.  MEDICATIONS:  Current Outpatient Medications  Medication Sig Dispense Refill  . capecitabine (XELODA) 500 MG tablet Take 3 tablets (1543m) by mouth 2 times daily, within 30 minutes of food, on radiation days only, M-F 180 tablet 0  . finasteride (PROSCAR) 5 MG tablet Take 5 mg by mouth.    . loperamide (IMODIUM) 2 MG capsule Take 2 mg by mouth.    . lovastatin (MEVACOR) 40 MG tablet Take  40 mg by mouth.    . nitrofurantoin, macrocrystal-monohydrate, (MACROBID) 100 MG capsule Take 1 capsule (100 mg total) by mouth 2 (two) times daily. 10 capsule 0   No current facility-administered medications for this visit.    PHYSICAL EXAMINATION:  ECOG PERFORMANCE STATUS: 1 Vitals:   01/21/18 0901  BP: (!) 155/72  Pulse: 63  Resp: 16  Temp: 97.9 F (36.6 C)  TempSrc: Oral  SpO2: 98%  Weight: 164 lb 4.8 oz (74.5 kg)  Height: 5' 10"  (1.778 m)    GENERAL:alert, no distress and comfortable SKIN: skin color, texture, turgor are normal, no rashes or significant lesions EYES: normal, conjunctiva are pink and non-injected, sclera clear OROPHARYNX:no exudate, no erythema and lips, buccal mucosa, and tongue normal  NECK: supple, thyroid normal size, non-tender, without nodularity LYMPH:  no palpable lymphadenopathy in the cervical, axillary or inguinal LUNGS: clear to auscultation and percussion with  normal breathing effort HEART: regular rate & rhythm and no murmurs and no lower extremity edema ABDOMEN:abdomen soft, non-tender and normal bowel sounds, rectal exam was deferred today  Musculoskeletal:no cyanosis of digits and no clubbing  PSYCH: alert & oriented x 3 with fluent speech NEURO: no focal motor/sensory deficits  LABORATORY DATA:  I have reviewed the data as listed CBC Latest Ref Rng & Units 01/21/2018 12/07/2017 11/30/2017  WBC 4.0 - 10.3 K/uL 6.4 6.5 6.7  Hemoglobin 13.0 - 17.1 g/dL 13.8 12.9(L) 12.5(L)  Hematocrit 38.4 - 49.9 % 41.4 39.5 38.8  Platelets 140 - 400 K/uL 168 156 137(L)    CMP Latest Ref Rng & Units 01/21/2018 12/07/2017 11/30/2017  Glucose 70 - 140 mg/dL 92 105 102  BUN 7 - 26 mg/dL 19 15.7 15.9  Creatinine 0.70 - 1.30 mg/dL 1.04 1.1 1.2  Sodium 136 - 145 mmol/L 143 141 141  Potassium 3.5 - 5.1 mmol/L 3.8 3.7 3.7  Chloride 98 - 109 mmol/L 108 - -  CO2 22 - 29 mmol/L 25 25 24   Calcium 8.4 - 10.4 mg/dL 9.3 8.8 8.9  Total Protein 6.4 - 8.3 g/dL 6.8 6.3(L) 6.3(L)  Total Bilirubin 0.2 - 1.2 mg/dL 0.6 0.54 0.55  Alkaline Phos 40 - 150 U/L 75 58 55  AST 5 - 34 U/L 13 16 24   ALT 0 - 55 U/L 9 18 18     PATHOLOGY   FINAL DIAGNOSIS 09/29/17  1. LG Intestine - Ascending Colon Polyp:  TUBULAR ADENOMA 2. LG Intestine - Rectum, Bx: INVASIVE MODERATELY DIFFERENTIATED ADENOCARCINOMA. THE MSI TESTING IS PENDING AND THE RESULT WILL BE REPORTED AS AN ADDENDUM.    PROCEDURES   Colonoscopy by Dr. Penelope Banks 09/29/17  Findings:  The perianal and digital rectal examination were normal.  A non-obstructing large mass was found in the proximal rectum and in the mid rectum.  The mass was circumferential.  This was biopsied with a cold forceps for histology.  Area was tattooed with an injection of Niger ink both distal and proximal to the mass.  There is about 4-5 cm of rectal mucosa distal to the mass that looks norma. This mass is about 7-8 cm in length.  A 10 mm polyp  was found in the ascending colon. The polyp was sessile. the polyp was removed with a hot snare.  Resection and retrieval were complete.     RADIOGRAPHIC STUDIES: I have personally reviewed the radiological images as listed and agreed with the findings in the report. No results found.  ASSESSMENT & PLAN:  Adrian Banks is a  84 y.o. male without a significant past medical history, presented with iron deficient anemia.  He underwent a colonoscopy which showed a non-obstructive mass from proximal rectum to mid rectum.   1. Rectal Cancer, invasive adenocarcinoma, TxNxM1 -I previously reviewed his colonoscopy and biopsy pathology results in great details with patient  -We discussed his CT Scan from 10/28/17 which shows multiple lung nodules, largest 2cm in RLL, which is very concerning for metastasis. I recommend a lung biopsy to rule out possible lung primary and to determine staging. I explained the risks of lung biopsy. After a lengthy discussion, he states he values his quality of life much more than quantity of his life, and would like to wait on his lung biopsy.  -I previously reviewed his 11/11/17 PET scan images in person with pt, his bilateral lung lesions I hypermetabolic on the PET scan, no significant thoracic adenopathy, this is likely metastatic disease from his rectal cancer, also primary lung cancer is not completely ruled out. I strongly encouraged him to consider biopsy if he would like to further treatment after he completes chemo and radiation.  He declined the biopsy during her chemoRT -He completed concurrent chemoradiation with Xeloda 11/05/17-12/18/17, tolerating well overall. His diarrhea is controlled with Imodium. No other complaints. -I again reviewed his previous scan findings, and encouraged him to consider lung biopsy to confirm metastasis.  However patient is not interested in any treatment at this point, and declined further workup. -Given he is some likely metastatic  disease, he is probably not a candidate for rectal surgery, I will discussed with Dr. Marcello Banks also.  Patient is not interested in rectal surgery anyway. -F/u in 2 months    2. Anemia, iron deficient -10/22/17 labs showed anemia with hemoglobin 8.0, MCV 64.9, normal WBC and platelet, this is likely iron deficient anemia, secondary to his rectal cancer bleeding. -He received IV Feraheme 11/26 and again on 11/30.  -His 11/02/17 Hg was 7.3, I suggest he needs a blood transfusion in the next few days. He agreed.  -He has received 2 dose of Feraheme on 11/02/17 and 11/09/17. He Hg has responded well  -He is fine to restart oral iron, but he does not want to restart at this time.  -resolved  3. Lung masses  -Likely metastatic disease from his rectal cancer, also primary lung cancer with metastasis is also possible. -Patient declined lung biopsy   -11/11/17 PET scan shows uptake in bilateral lung masses. I previously recommended a lung biopsy for definitve diagnosis and encouraged him to consider it again. He declined again  4. Goal of care discussion  -We again discussed the incurable nature of his cancer, and the overall poor prognosis, especially if he does not have good response to radiation, chemotherapy or progress on chemo -The patient understands the goal of care is palliative. -I previously recommended DNR/DNI, he will think about it    PLAN: -Lab and f/u in 2 months   No orders of the defined types were placed in this encounter.   All questions were answered. The patient knows to call the clinic with any problems, questions or concerns.  I spent 15 minutes counseling the patient face to face. The total time spent in the appointment was 20 minutes and more than 50% was on counseling.     Truitt Merle, MD 01/21/2018   This document serves as a record of services personally performed by Truitt Merle, MD. It was created on her behalf by Joslyn Devon, a trained medical  scribe. The creation  of this record is based on the scribe's personal observations and the provider's statements to them.    I have reviewed the above documentation for accuracy and completeness, and I agree with the above.

## 2018-01-21 ENCOUNTER — Inpatient Hospital Stay (HOSPITAL_BASED_OUTPATIENT_CLINIC_OR_DEPARTMENT_OTHER): Payer: Medicare Other | Admitting: Hematology

## 2018-01-21 ENCOUNTER — Encounter: Payer: Self-pay | Admitting: Radiation Oncology

## 2018-01-21 ENCOUNTER — Encounter: Payer: Self-pay | Admitting: Hematology

## 2018-01-21 ENCOUNTER — Ambulatory Visit
Admission: RE | Admit: 2018-01-21 | Discharge: 2018-01-21 | Disposition: A | Discharge status: Home / Self Care | Payer: Medicare Other | Source: Ambulatory Visit | Attending: Radiation Oncology | Admitting: Radiation Oncology

## 2018-01-21 ENCOUNTER — Inpatient Hospital Stay: Payer: Medicare Other | Attending: Hematology

## 2018-01-21 ENCOUNTER — Telehealth: Payer: Self-pay | Admitting: Hematology

## 2018-01-21 VITALS — BP 144/87 | HR 73 | Temp 97.8°F | Resp 20 | Ht 70.0 in | Wt 163.8 lb

## 2018-01-21 VITALS — BP 155/72 | HR 63 | Temp 97.9°F | Resp 16 | Ht 70.0 in | Wt 164.3 lb

## 2018-01-21 DIAGNOSIS — R918 Other nonspecific abnormal finding of lung field: Secondary | ICD-10-CM | POA: Insufficient documentation

## 2018-01-21 DIAGNOSIS — K449 Diaphragmatic hernia without obstruction or gangrene: Secondary | ICD-10-CM | POA: Insufficient documentation

## 2018-01-21 DIAGNOSIS — Z87891 Personal history of nicotine dependence: Secondary | ICD-10-CM | POA: Insufficient documentation

## 2018-01-21 DIAGNOSIS — I700 Atherosclerosis of aorta: Secondary | ICD-10-CM | POA: Insufficient documentation

## 2018-01-21 DIAGNOSIS — N400 Enlarged prostate without lower urinary tract symptoms: Secondary | ICD-10-CM | POA: Insufficient documentation

## 2018-01-21 DIAGNOSIS — D509 Iron deficiency anemia, unspecified: Secondary | ICD-10-CM | POA: Insufficient documentation

## 2018-01-21 DIAGNOSIS — Z923 Personal history of irradiation: Secondary | ICD-10-CM | POA: Diagnosis not present

## 2018-01-21 DIAGNOSIS — Z9221 Personal history of antineoplastic chemotherapy: Secondary | ICD-10-CM

## 2018-01-21 DIAGNOSIS — N281 Cyst of kidney, acquired: Secondary | ICD-10-CM | POA: Diagnosis not present

## 2018-01-21 DIAGNOSIS — K921 Melena: Secondary | ICD-10-CM | POA: Diagnosis not present

## 2018-01-21 DIAGNOSIS — R197 Diarrhea, unspecified: Secondary | ICD-10-CM | POA: Diagnosis not present

## 2018-01-21 DIAGNOSIS — C20 Malignant neoplasm of rectum: Secondary | ICD-10-CM | POA: Diagnosis not present

## 2018-01-21 DIAGNOSIS — N210 Calculus in bladder: Secondary | ICD-10-CM | POA: Diagnosis not present

## 2018-01-21 DIAGNOSIS — Z79899 Other long term (current) drug therapy: Secondary | ICD-10-CM | POA: Diagnosis not present

## 2018-01-21 DIAGNOSIS — N200 Calculus of kidney: Secondary | ICD-10-CM | POA: Diagnosis not present

## 2018-01-21 LAB — COMPREHENSIVE METABOLIC PANEL
ALT: 9 U/L (ref 0–55)
ANION GAP: 10 (ref 3–11)
AST: 13 U/L (ref 5–34)
Albumin: 3.5 g/dL (ref 3.5–5.0)
Alkaline Phosphatase: 75 U/L (ref 40–150)
BILIRUBIN TOTAL: 0.6 mg/dL (ref 0.2–1.2)
BUN: 19 mg/dL (ref 7–26)
CHLORIDE: 108 mmol/L (ref 98–109)
CO2: 25 mmol/L (ref 22–29)
Calcium: 9.3 mg/dL (ref 8.4–10.4)
Creatinine, Ser: 1.04 mg/dL (ref 0.70–1.30)
Glucose, Bld: 92 mg/dL (ref 70–140)
POTASSIUM: 3.8 mmol/L (ref 3.5–5.1)
Sodium: 143 mmol/L (ref 136–145)
TOTAL PROTEIN: 6.8 g/dL (ref 6.4–8.3)

## 2018-01-21 LAB — CBC WITH DIFFERENTIAL/PLATELET
Basophils Absolute: 0 10*3/uL (ref 0.0–0.1)
Basophils Relative: 1 %
EOS ABS: 0.3 10*3/uL (ref 0.0–0.5)
Eosinophils Relative: 4 %
HEMATOCRIT: 41.4 % (ref 38.4–49.9)
Hemoglobin: 13.8 g/dL (ref 13.0–17.1)
LYMPHS ABS: 0.8 10*3/uL — AB (ref 0.9–3.3)
LYMPHS PCT: 12 %
MCH: 28.5 pg (ref 27.2–33.4)
MCHC: 33.3 g/dL (ref 32.0–36.0)
MCV: 85.5 fL (ref 79.3–98.0)
MONO ABS: 1 10*3/uL — AB (ref 0.1–0.9)
Monocytes Relative: 15 %
NEUTROS PCT: 68 %
Neutro Abs: 4.4 10*3/uL (ref 1.5–6.5)
PLATELETS: 168 10*3/uL (ref 140–400)
RBC: 4.84 MIL/uL (ref 4.20–5.82)
WBC: 6.4 10*3/uL (ref 4.0–10.3)
nRBC: 0 /100 WBC

## 2018-01-21 NOTE — Addendum Note (Signed)
Encounter addended by: Malena Edman, RN on: 01/21/2018 9:30 AM  Actions taken: Charge Capture section accepted

## 2018-01-21 NOTE — Progress Notes (Signed)
  Radiation Oncology         [phone] ________________________________  Name: Adrian Banks MRN: [id number]  Date of Service: 01/21/2018 DOB: 06-23-1932  Post Treatment Note  CC: Aletha Halim., PA-C  Leighton Ruff, MD  Diagnosis:   Adenocarcinoma of the rectum.  Interval Since Last Radiation:  5 weeks   11/05/2017 - 12/18/2017: The rectum was initially treated to 45 Gy in 25 fractions, followed by a 5.4 Gy boost delivered in 3 fractions to yield a total dose of 50.4 Gy using a 3-D technique.    Narrative:  The patient returns today for routine follow-up. He tolerated radiotherapy well, and had mild fatigue, and diarrhea.                           On review of systems, the patient states he tolerated radiotherapy well. He is not having any trouble with rectal pain, but takes 3 imodium tablets per day for loose stools. He denies any significant rectal bleeding but at times notes a small amount of blood in his stool. No other complaints are noted.  ALLERGIES:  has No Known Allergies.  Meds: Current Outpatient Medications  Medication Sig Dispense Refill  . capecitabine (XELODA) 500 MG tablet Take 3 tablets (1500mg ) by mouth 2 times daily, within 30 minutes of food, on radiation days only, M-F 180 tablet 0  . finasteride (PROSCAR) 5 MG tablet Take 5 mg by mouth.    . loperamide (IMODIUM) 2 MG capsule Take 2 mg by mouth.    . lovastatin (MEVACOR) 40 MG tablet Take 40 mg by mouth.    . nitrofurantoin, macrocrystal-monohydrate, (MACROBID) 100 MG capsule Take 1 capsule (100 mg total) by mouth 2 (two) times daily. 10 capsule 0   No current facility-administered medications for this encounter.     Physical Findings:  height is 5\' 10"  (1.778 m) and weight is 163 lb 12.8 oz (74.3 kg). His oral temperature is 97.8 F (36.6 C). His blood pressure is 144/87 (abnormal) and his pulse is 73. His respiration is 20 and oxygen saturation is 95%.  Pain Assessment Pain Score: 0-No  pain/10 In general this is a well appearing African American male in no acute distress. He's alert and oriented x4 and appropriate throughout the examination. Cardiopulmonary assessment is negative for acute distress and he exhibits normal effort.   Lab Findings: Lab Results  Component Value Date   WBC 6.5 12/07/2017   HGB 12.9 (L) 12/07/2017   HCT 39.5 12/07/2017   MCV 80.1 12/07/2017   PLT 156 12/07/2017     Radiographic Findings: No results found.  Impression/Plan: 1. Adenocarcinoma of the rectum. The patient appeared to be a candidate for surgical resection following neoadjuvant therapy and will follow up with Dr. Marcello Moores on 02/02/18. He will keep me informed of any persistent symptoms in the next few weeks as he continues to have loose stools and occasional BRB per rectum. He declined anusol suppositories today. 2. Pulmonary nodules. The patient will be followed by Dr. Burr Medico with repeat imaging.      Carola Rhine, PAC

## 2018-01-21 NOTE — Telephone Encounter (Signed)
Scheduled appt per 2/14 los - Gave patient AVS and calender per los.  

## 2018-03-19 NOTE — Progress Notes (Signed)
[place]  [phone] Fax:([phone]  Clinic Follow Up Note   Patient Care Team: Adrian Banks as PCP - General (Family Medicine)   Date of Service: 03/22/2018   CHIEF COMPLAINTS:  Follow up rectal Cancer  Oncology History   Cancer Staging Rectal cancer [facility]) Staging form: Colon and Rectum, AJCC 8th Edition - Clinical stage from 09/29/2017: Stage IVA (cTX, cNX, cM1a) - Signed by Adrian Merle, MD on 03/21/2018       Rectal cancer ([place])   09/29/2017 Procedure    Colonoscopy by Dr. Penelope Banks 09/29/17  Findings:  The perianal and digital rectal examination were normal.  A non-obstructing large mass was found in the proximal rectum and in the mid rectum.  The mass was circumferential.  This was biopsied with a cold forceps for histology.  Area was tattooed with an injection of Niger ink both distal and proximal to the mass.  There is about 4-5 cm of rectal mucosa distal to the mass that looks norma. This mass is about 7-8 cm in length.  A 10 mm polyp was found in the ascending colon. The polyp was sessile. the polyp was removed with a hot snare.  Resection and retrieval were complete.        09/29/2017 Initial Biopsy    FINAL DIAGNOSIS 09/29/17  1. LG Intestine - Ascending Colon Polyp:  TUBULAR ADENOMA 2. LG Intestine - Rectum, Bx: INVASIVE MODERATELY DIFFERENTIATED ADENOCARCINOMA. THE MSI TESTING IS PENDING AND THE RESULT WILL BE REPORTED AS AN ADDENDUM.      09/29/2017 Cancer Staging    Staging form: Colon and Rectum, AJCC 8th Edition - Clinical stage from 09/29/2017: Stage IVA (cTX, cNX, cM1a) - Signed by Adrian Merle, MD on 03/21/2018      10/21/2017 Initial Diagnosis    Rectal cancer ([place])      10/28/2017 Imaging    CT CAP W Contrast  IMPRESSION: 1. Long segment circumferential wall thickening of the rectosigmoid colon consistent with known adenocarcinoma. No evidence of bowel obstruction or perforation. 2. No definite  signs of metastatic disease in the abdomen or pelvis. Soft tissue nodularity in the right pelvis is probably due to an unopacified pelvic vein. 3. Multiple pulmonary nodules, some cavitary. These are not typical of metastatic adenocarcinoma (especially without evidence of abdominal disease). Consider multicentric or metastatic lung cancer. PET-CT may be helpful to assess hypermetabolic activity and guided tissue sampling. 4. Multiple bladder calculi consistent with chronic bladder outlet obstruction from prostatomegaly. Nonobstructing right renal calculus. No hydronephrosis. 5. Aortic Atherosclerosis (ICD10-I70.0). Additional incidental findings including a moderate size hiatal hernia and renal cysts.       11/05/2017 - 12/18/2017 Radiation Therapy    Radiation with Adrian Banks With Xeloda      11/09/2017 - 12/18/2017 Chemotherapy    Chemoradiation with Xeloda 889m/m2 q12h, M-F on days of radiation plan to starting 11/09/17      11/11/2017 Imaging    PET Scan  IMPRESSION:  Wall thickening/ mass involving the upper rectum, corresponding to the patient's known rectal adenocarcinoma. Bilateral pulmonary nodules, partially cavitary in the left upper lobe nodule, suspicious for metastases. Notably, cavitary metastases are more common with squamous cell carcinoma rather than the patient's known adenocarcinoma.         HISTORY OF PRESENTING ILLNESS: 10/22/17  [name] is here because of newly diagnosed rectal cancer. He was referred by Adrian Banks He presents to the clinic today by himself.  Pt is a poor historian. He denies any significant abdominal discomfort, nausea, bloating, rectal bleeding or change in bowel habits.  He was referred to GI doctor Adrian Banks for colonoscopy by his PCP due to deficient anemia, which showed a nonobstructing mass in the proximal rectum to mid rectum, biopsy showed invasive adenocarcinoma.  He was referred to colorectal surgeon Dr. Marcello Banks, who  recommended surgical resection.  Adrian Banks referred patient to [place] and to Adrian Banks for evaluation of new adjuvant chemoradiation.  Adrian Banks has ordered staging CT chest, abdomen and pelvis, and pelvic MRI for staging these scans has not been scheduled yet.   He has had some dysuria in the past few months, was seen by his PCP and diagnosed with BPH. He started Proscar.  He is married, lives with his wife, daughter independent and functions well.  CURRENT THERAPY:  Observation  INTERVAL HISTORY:  Adrian Banks is here for a follow up. He presents in the clinic today by himself. He reports his diarrhea is greatly improved with imodium. He only needs it as needed now, about once a day.    On review of systems, pt denies blood in stool, pain, chest discomfort, chest pain, SOB or any other complaints at this time. Pertinent positives are listed and detailed within the above HPI.   REVIEW OF SYSTEMS:   Constitutional: Denies fevers, chills or abnormal night sweats  Eyes: Denies blurriness of vision, double vision or watery eyes Ears, nose, mouth, throat, and face: Denies mucositis or sore throat Respiratory: Denies cough, dyspnea or wheezes Cardiovascular: Denies palpitation, chest discomfort or lower extremity swelling Gastrointestinal:  Denies nausea, heartburn (+) Diarrhea, controlled with imodium  Skin: Denies Rashes Lymphatics: Denies new lymphadenopathy or easy bruising Neurological:Denies numbness, tingling or new weaknesses Behavioral/Psych: Mood is stable, no new changes  All other systems were reviewed with the patient and are negative.   MEDICAL HISTORY:  Past Medical History:  Diagnosis Date  . Cancer ([place]) 09/29/2017   Rectal cvancer    SURGICAL HISTORY: History reviewed. No pertinent surgical history.  SOCIAL HISTORY: Social History   Socioeconomic History  . Marital status: Married    Spouse name: Not on file  . Number of children: Not on file  . Years of education: Not  on file  . Highest education level: Not on file  Occupational History  . Not on file  Social Needs  . Financial resource strain: Not on file  . Food insecurity:    Worry: Not on file    Inability: Not on file  . Transportation needs:    Medical: Not on file    Non-medical: Not on file  Tobacco Use  . Smoking status: Former Smoker    Packs/day: 0.50    Years: 10.00    Pack years: 5.00    Last attempt to quit: 1988    Years since quitting: 31.3  . Smokeless tobacco: Never Used  Substance and Sexual Activity  . Alcohol use: Yes    Comment: ocassionally   . Drug use: No  . Sexual activity: Not on file  Lifestyle  . Physical activity:    Days per week: Not on file    Minutes per session: Not on file  . Stress: Not on file  Relationships  . Social connections:    Talks on phone: Not on file    Gets together: Not on file    Attends religious service: Not on file    Active member of club or organization: Not  on file    Attends meetings of clubs or organizations: Not on file    Relationship status: Not on file  . Intimate partner violence:    Fear of current or ex partner: Not on file    Emotionally abused: Not on file    Physically abused: Not on file    Forced sexual activity: Not on file  Other Topics Concern  . Not on file  Social History Narrative  . Not on file    FAMILY HISTORY: Family History  Problem Relation Age of Onset  . Cancer Mother        cancer (unknown type)    ALLERGIES:  has No Known Allergies.  MEDICATIONS:  Current Outpatient Medications  Medication Sig Dispense Refill  . finasteride (PROSCAR) 5 MG tablet Take 5 mg by mouth.    . loperamide (IMODIUM) 2 MG capsule Take 2 mg by mouth.    . lovastatin (MEVACOR) 40 MG tablet Take 40 mg by mouth.    . nitrofurantoin, macrocrystal-monohydrate, (MACROBID) 100 MG capsule Take 1 capsule (100 mg total) by mouth 2 (two) times daily. 10 capsule 0  . capecitabine (XELODA) 500 MG tablet Take 3 tablets  (1560m) by mouth 2 times daily, within 30 minutes of food, on radiation days only, M-F (Patient not taking: Reported on 03/22/2018) 180 tablet 0   No current facility-administered medications for this visit.    PHYSICAL EXAMINATION:  ECOG PERFORMANCE STATUS: 1 Vitals:   03/22/18 0953  BP: (!) 134/96  Pulse: 79  Resp: 20  Temp: 98.1 F (36.7 C)  TempSrc: Oral  SpO2: 94%  Weight: 164 lb 14.4 oz (74.8 kg)  Height: _0  (1.778 m)    GENERAL:alert, no distress and comfortable SKIN: skin color, texture, turgor are normal, no rashes or significant lesions EYES: normal, conjunctiva are pink and non-injected, sclera clear OROPHARYNX:no exudate, no erythema and lips, buccal mucosa, and tongue normal  NECK: supple, thyroid normal size, non-tender, without nodularity LYMPH:  no palpable lymphadenopathy in the cervical, axillary or inguinal LUNGS: clear to auscultation and percussion with normal breathing effort HEART: regular rate & rhythm and no murmurs and no lower extremity edema ABDOMEN:abdomen soft, non-tender and normal bowel sounds, rectal exam was deferred today  Musculoskeletal:no cyanosis of digits and no clubbing  PSYCH: alert & oriented x 3 with fluent speech NEURO: no focal motor/sensory deficits  LABORATORY DATA:  I have reviewed the data as listed CBC Latest Ref Rng & Units 03/22/2018 01/21/2018 12/07/2017  WBC 4.0 - 10.3 K/uL 6.8 6.4 6.5  Hemoglobin 13.0 - 17.1 g/dL 14.0 13.8 12.9(L)  Hematocrit 38.4 - 49.9 % 42.3 41.4 39.5  Platelets 140 - 400 K/uL 164 168 156    CMP Latest Ref Rng & Units 03/22/2018 01/21/2018 12/07/2017  Glucose 70 - 140 mg/dL 101 92 105  BUN 7 - 26 mg/dL 19 19 15.7  Creatinine 0.70 - 1.30 mg/dL 1.24 1.04 1.1  Sodium 136 - 145 mmol/L 142 143 141  Potassium 3.5 - 5.1 mmol/L 4.0 3.8 3.7  Chloride 98 - 109 mmol/L 109 108 -  CO2 22 - 29 mmol/L _1 Calcium 8.4 - 10.4 mg/dL 9.3 9.3 8.8  Total Protein 6.4 - 8.3 g/dL 6.9 6.8 6.3(L)  Total  Bilirubin 0.2 - 1.2 mg/dL 0.6 0.6 0.54  Alkaline Phos 40 - 150 U/L 71 75 58  AST 5 - 34 U/L _2 ALT 0 - 55 U/L 11 9 18  PATHOLOGY   FINAL DIAGNOSIS 09/29/17  1. LG Intestine - Ascending Colon Polyp:  TUBULAR ADENOMA 2. LG Intestine - Rectum, Bx: INVASIVE MODERATELY DIFFERENTIATED ADENOCARCINOMA. THE MSI TESTING IS PENDING AND THE RESULT WILL BE REPORTED AS AN ADDENDUM.    PROCEDURES   Colonoscopy by Dr. Penelope Banks 09/29/17  Findings:  The perianal and digital rectal examination were normal.  A non-obstructing large mass was found in the proximal rectum and in the mid rectum.  The mass was circumferential.  This was biopsied with a cold forceps for histology.  Area was tattooed with an injection of [place] ink both distal and proximal to the mass.  There is about 4-5 cm of rectal mucosa distal to the mass that looks norma. This mass is about 7-8 cm in length.  A 10 mm polyp was found in the ascending colon. The polyp was sessile. the polyp was removed with a hot snare.  Resection and retrieval were complete.     RADIOGRAPHIC STUDIES: I have personally reviewed the radiological images as listed and agreed with the findings in the report. No results found.  ASSESSMENT & PLAN:  Adrian Banks is a 84 y.o. male without a significant past medical history, presented with iron deficient anemia.  He underwent a colonoscopy which showed a non-obstructive mass from proximal rectum to mid rectum.   1. Rectal Cancer, invasive adenocarcinoma, [id number] -I previously reviewed his colonoscopy and biopsy pathology results in great details with patient  -We previously discussed his CT Scan from 10/28/17 which shows multiple lung nodules, largest 2cm in RLL, which is very concerning for metastasis. I recommend a lung biopsy to rule out possible lung primary and to determine staging. I explained the risks of lung biopsy. After a lengthy discussion, he states he values his quality of life much more  than quantity of his life, and would like to wait on his lung biopsy.  -I previously reviewed his 11/11/17 PET scan images in person with pt, his bilateral lung lesions I hypermetabolic on the PET scan, no significant thoracic adenopathy, this is likely metastatic disease from his rectal cancer, also primary lung cancer is not completely ruled out. I strongly encouraged him to consider biopsy if he would like to further treatment after he completes chemo and radiation.  He declined the biopsy during chemoRT -He completed concurrent chemoradiation with Xeloda 11/05/17-12/18/17, tolerated well overall. His diarrhea is controlled with Imodium. No other complaints. -I previously reviewed his scan findings at Feb 2019 visit, and encouraged him to consider lung biopsy to confirm metastasis.  However patient is not interested in any treatment at this point, and declined further workup at that time. -Given that he has some likely metastatic disease, he is probably not a candidate for rectal surgery, I discussed with Dr. Marcello Banks also.  Patient is not interested in rectal surgery anyway. -I again discussed the spots in his lungs and discussed the option of a restaging scan to evaluate the growth of his disease. Pt states he feels well and would not like a scan at this time or treatment. He repeatedly states he does not want any more treatment if he is not symptomatic. I again discussed that he feels well now so he could tolerate treatment better if he wants to prolong his life. He again wants to preserve his quality of life.  He understands and voiced good understanding.  -I discussed that there may be non-chemo treatment options. I sent our navigator Dawn a message to have his  initial rectal tumor to be tested for MMR/MSI to see if he is a candidate for immunotherapay.  -F/u in 3 months    2. Anemia, iron deficient -10/22/17 labs showed anemia with hemoglobin 8.0, MCV 64.9, normal WBC and platelet, this is likely  iron deficient anemia, secondary to his rectal cancer bleeding. -He received IV Feraheme 11/26 and again on 11/30.  -His 11/02/17 Hg was 7.3, I suggested he needs a blood transfusion in the next few days. He agreed.  -He has received 2 dose of Feraheme on 11/02/17 and 11/09/17. His Hg has responded well  -He is fine to restart oral iron, but he does not want to restart at this time.  -resolved  3. Lung masses  -Likely metastatic disease from his rectal cancer, also primary lung cancer with metastasis is also possible. -Patient declined lung biopsy   -11/11/17 PET scan shows uptake in bilateral lung masses. I previously recommended a lung biopsy for definitve diagnosis and encouraged him to consider it again. He declined again  4. Goal of care discussion  -We previously discussed the incurable nature of his cancer, and the overall poor prognosis, especially if he does not have good response to radiation, chemotherapy or progress on chemo -The patient understands the goal of care is palliative. -I previously recommended DNR/DNI, he will think about it    PLAN: -MSR/MSI testing on his initial rectal tumor biopsy  -discussed ordering a restaging scan today, pt declined  -Lab and f/u in 3 months   No orders of the defined types were placed in this encounter.   All questions were answered. The patient knows to call the clinic with any problems, questions or concerns.  I spent 20 minutes counseling the patient face to face. The total time spent in the appointment was 25 minutes and more than 50% was on counseling.  This document serves as a record of services personally performed by Adrian Merle, MD. It was created on her behalf by Theresia Bough, a trained medical scribe. The creation of this record is based on the scribe's personal observations and the provider's statements to them.   I have reviewed the above documentation for accuracy and completeness, and I agree with the above.     Adrian Merle, MD 03/22/2018 11:49 AM

## 2018-03-22 ENCOUNTER — Inpatient Hospital Stay: Payer: Medicare Other

## 2018-03-22 ENCOUNTER — Telehealth: Payer: Self-pay

## 2018-03-22 ENCOUNTER — Encounter: Payer: Self-pay | Admitting: Hematology

## 2018-03-22 ENCOUNTER — Inpatient Hospital Stay: Payer: Medicare Other | Attending: Hematology | Admitting: Hematology

## 2018-03-22 VITALS — BP 134/96 | HR 79 | Temp 98.1°F | Resp 20 | Ht 70.0 in | Wt 164.9 lb

## 2018-03-22 DIAGNOSIS — Z79899 Other long term (current) drug therapy: Secondary | ICD-10-CM | POA: Diagnosis not present

## 2018-03-22 DIAGNOSIS — R918 Other nonspecific abnormal finding of lung field: Secondary | ICD-10-CM | POA: Insufficient documentation

## 2018-03-22 DIAGNOSIS — C20 Malignant neoplasm of rectum: Secondary | ICD-10-CM

## 2018-03-22 DIAGNOSIS — D509 Iron deficiency anemia, unspecified: Secondary | ICD-10-CM | POA: Diagnosis not present

## 2018-03-22 DIAGNOSIS — R197 Diarrhea, unspecified: Secondary | ICD-10-CM | POA: Insufficient documentation

## 2018-03-22 LAB — CBC WITH DIFFERENTIAL/PLATELET
Basophils Absolute: 0.1 10*3/uL (ref 0.0–0.1)
Basophils Relative: 1 %
Eosinophils Absolute: 0.1 10*3/uL (ref 0.0–0.5)
Eosinophils Relative: 2 %
HCT: 42.3 % (ref 38.4–49.9)
Hemoglobin: 14 g/dL (ref 13.0–17.1)
Lymphocytes Relative: 11 %
Lymphs Abs: 0.7 10*3/uL — ABNORMAL LOW (ref 0.9–3.3)
MCH: 29.9 pg (ref 27.2–33.4)
MCHC: 33.2 g/dL (ref 32.0–36.0)
MCV: 90.2 fL (ref 79.3–98.0)
Monocytes Absolute: 0.7 10*3/uL (ref 0.1–0.9)
Monocytes Relative: 10 %
Neutro Abs: 5.2 10*3/uL (ref 1.5–6.5)
Neutrophils Relative %: 76 %
Platelets: 164 10*3/uL (ref 140–400)
RBC: 4.69 MIL/uL (ref 4.20–5.82)
RDW: 13.9 % (ref 11.0–14.6)
WBC: 6.8 10*3/uL (ref 4.0–10.3)

## 2018-03-22 LAB — COMPREHENSIVE METABOLIC PANEL
ALBUMIN: 3.5 g/dL (ref 3.5–5.0)
ALT: 11 U/L (ref 0–55)
AST: 13 U/L (ref 5–34)
Alkaline Phosphatase: 71 U/L (ref 40–150)
Anion gap: 9 (ref 3–11)
BUN: 19 mg/dL (ref 7–26)
CHLORIDE: 109 mmol/L (ref 98–109)
CO2: 24 mmol/L (ref 22–29)
Calcium: 9.3 mg/dL (ref 8.4–10.4)
Creatinine, Ser: 1.24 mg/dL (ref 0.70–1.30)
GFR calc Af Amer: >60 mL/min (ref >60)
GFR calc non Af Amer: 52 mL/min — ABNORMAL LOW (ref >60)
GLUCOSE: 101 mg/dL (ref 70–140)
POTASSIUM: 4 mmol/L (ref 3.5–5.1)
SODIUM: 142 mmol/L (ref 136–145)
Total Bilirubin: 0.6 mg/dL (ref 0.2–1.2)
Total Protein: 6.9 g/dL (ref 6.4–8.3)

## 2018-03-22 NOTE — Telephone Encounter (Signed)
Printed avs and calender of upcoming appointment. Per 4/15 los 

## 2018-06-17 ENCOUNTER — Telehealth: Payer: Self-pay | Admitting: Hematology

## 2018-06-17 NOTE — Telephone Encounter (Signed)
Pt cld to cancel appointment with Dr. Burr Medico on 7/15. Pt states that he is having the same thing done at his PCP's office. Declined to reschedule

## 2018-06-21 ENCOUNTER — Other Ambulatory Visit: Payer: Medicare Other

## 2018-06-21 ENCOUNTER — Ambulatory Visit: Payer: Medicare Other | Admitting: Hematology

## 2018-08-18 NOTE — Telephone Encounter (Signed)
No additional information

## 2018-12-24 ENCOUNTER — Encounter (HOSPITAL_COMMUNITY): Payer: Self-pay | Admitting: Emergency Medicine

## 2018-12-24 ENCOUNTER — Observation Stay (HOSPITAL_COMMUNITY)
Admission: EM | Admit: 2018-12-24 | Discharge: 2018-12-26 | Disposition: A | Discharge status: Home / Self Care | Payer: Medicare Other | Attending: Internal Medicine | Admitting: Internal Medicine

## 2018-12-24 DIAGNOSIS — Z87891 Personal history of nicotine dependence: Secondary | ICD-10-CM | POA: Diagnosis not present

## 2018-12-24 DIAGNOSIS — R918 Other nonspecific abnormal finding of lung field: Secondary | ICD-10-CM | POA: Diagnosis not present

## 2018-12-24 DIAGNOSIS — N400 Enlarged prostate without lower urinary tract symptoms: Secondary | ICD-10-CM | POA: Insufficient documentation

## 2018-12-24 DIAGNOSIS — D500 Iron deficiency anemia secondary to blood loss (chronic): Secondary | ICD-10-CM | POA: Diagnosis not present

## 2018-12-24 DIAGNOSIS — C19 Malignant neoplasm of rectosigmoid junction: Secondary | ICD-10-CM | POA: Insufficient documentation

## 2018-12-24 DIAGNOSIS — Z79899 Other long term (current) drug therapy: Secondary | ICD-10-CM | POA: Diagnosis not present

## 2018-12-24 DIAGNOSIS — K922 Gastrointestinal hemorrhage, unspecified: Secondary | ICD-10-CM | POA: Diagnosis not present

## 2018-12-24 DIAGNOSIS — R197 Diarrhea, unspecified: Secondary | ICD-10-CM

## 2018-12-24 DIAGNOSIS — Z923 Personal history of irradiation: Secondary | ICD-10-CM | POA: Insufficient documentation

## 2018-12-24 DIAGNOSIS — C20 Malignant neoplasm of rectum: Secondary | ICD-10-CM | POA: Diagnosis present

## 2018-12-24 DIAGNOSIS — N210 Calculus in bladder: Secondary | ICD-10-CM | POA: Diagnosis not present

## 2018-12-24 LAB — URINALYSIS, ROUTINE W REFLEX MICROSCOPIC
Bilirubin Urine: NEGATIVE
GLUCOSE, UA: NEGATIVE mg/dL
Ketones, ur: 5 mg/dL — AB
Leukocytes, UA: NEGATIVE
Nitrite: NEGATIVE
Protein, ur: NEGATIVE mg/dL
RBC / HPF: >50 RBC/hpf — ABNORMAL HIGH (ref 0–5)
Specific Gravity, Urine: 1.024 (ref 1.005–1.030)
pH: 5 (ref 5.0–8.0)

## 2018-12-24 LAB — CBC
HCT: 38.2 % — ABNORMAL LOW (ref 39.0–52.0)
Hemoglobin: 11.4 g/dL — ABNORMAL LOW (ref 13.0–17.0)
MCH: 25.5 pg — ABNORMAL LOW (ref 26.0–34.0)
MCHC: 29.8 g/dL — ABNORMAL LOW (ref 30.0–36.0)
MCV: 85.5 fL (ref 80.0–100.0)
NRBC: 0 % (ref 0.0–0.2)
Platelets: 251 10*3/uL (ref 150–400)
RBC: 4.47 MIL/uL (ref 4.22–5.81)
RDW: 15.9 % — ABNORMAL HIGH (ref 11.5–15.5)
WBC: 11 10*3/uL — ABNORMAL HIGH (ref 4.0–10.5)

## 2018-12-24 LAB — COMPREHENSIVE METABOLIC PANEL
ALT: 15 U/L (ref 0–44)
AST: 18 U/L (ref 15–41)
Albumin: 3.4 g/dL — ABNORMAL LOW (ref 3.5–5.0)
Alkaline Phosphatase: 47 U/L (ref 38–126)
Anion gap: 11 (ref 5–15)
BUN: 18 mg/dL (ref 8–23)
CO2: 21 mmol/L — ABNORMAL LOW (ref 22–32)
Calcium: 8.8 mg/dL — ABNORMAL LOW (ref 8.9–10.3)
Chloride: 110 mmol/L (ref 98–111)
Creatinine, Ser: 1.08 mg/dL (ref 0.61–1.24)
GFR calc Af Amer: >60 mL/min (ref >60)
GFR calc non Af Amer: >60 mL/min (ref >60)
Glucose, Bld: 88 mg/dL (ref 70–99)
Potassium: 3.7 mmol/L (ref 3.5–5.1)
Sodium: 142 mmol/L (ref 135–145)
Total Bilirubin: 0.4 mg/dL (ref 0.3–1.2)
Total Protein: 6.5 g/dL (ref 6.5–8.1)

## 2018-12-24 LAB — LIPASE, BLOOD: LIPASE: 23 U/L (ref 11–51)

## 2018-12-24 MED ORDER — SODIUM CHLORIDE 0.9% FLUSH: 3.0000 mL | Freq: Once | INTRAVENOUS | Status: DC

## 2018-12-24 NOTE — ED Triage Notes (Signed)
Pt reports loose stool, for over a year, saw his PCP on Tuesday however medication did not work.  Is scheduled to go back to PCP in over a week.

## 2018-12-25 ENCOUNTER — Encounter (HOSPITAL_COMMUNITY)
Admission: EM | Disposition: A | Discharge status: Home / Self Care | Payer: Self-pay | Source: Home / Self Care | Attending: Emergency Medicine

## 2018-12-25 ENCOUNTER — Emergency Department (HOSPITAL_COMMUNITY): Payer: Medicare Other

## 2018-12-25 ENCOUNTER — Other Ambulatory Visit: Payer: Self-pay

## 2018-12-25 DIAGNOSIS — K922 Gastrointestinal hemorrhage, unspecified: Secondary | ICD-10-CM

## 2018-12-25 HISTORY — PX: BIOPSY: SHX5522

## 2018-12-25 HISTORY — PX: FLEXIBLE SIGMOIDOSCOPY: SHX5431

## 2018-12-25 LAB — HEMOGLOBIN AND HEMATOCRIT, BLOOD
HCT: 33.1 % — ABNORMAL LOW (ref 39.0–52.0)
Hemoglobin: 10.7 g/dL — ABNORMAL LOW (ref 13.0–17.0)

## 2018-12-25 SURGERY — SIGMOIDOSCOPY, FLEXIBLE
Anesthesia: Moderate Sedation

## 2018-12-25 MED ORDER — SODIUM CHLORIDE 0.9% FLUSH
3.0000 mL | Freq: Two times a day (BID) | INTRAVENOUS | Status: DC
  Administered 2018-12-25 – 2018-12-26 (×3): 3 mL via INTRAVENOUS

## 2018-12-25 MED ORDER — SODIUM CHLORIDE 0.9 % IV SOLN: INTRAVENOUS | Status: DC

## 2018-12-25 MED ORDER — IOHEXOL 300 MG/ML  SOLN
100.0000 mL | Freq: Once | INTRAMUSCULAR | Status: AC | PRN
  Administered 2018-12-25: 100 mL via INTRAVENOUS

## 2018-12-25 MED ORDER — SODIUM CHLORIDE 0.9% FLUSH: 3.0000 mL | INTRAVENOUS | Status: DC | PRN

## 2018-12-25 MED ORDER — SODIUM CHLORIDE 0.9 % IV SOLN: 250.0000 mL | INTRAVENOUS | Status: DC | PRN

## 2018-12-25 NOTE — ED Notes (Signed)
Patient transported to CT 

## 2018-12-25 NOTE — Consult Note (Signed)
Subjective:   HPI  The patient is an 85 year old male who was diagnosed with adenocarcinoma of the rectum in October 2018. After this diagnosis was made I referred him to Dr. Henri Medal the colorectal surgeon and she referred him to oncology and the patient underwent radiation treatment for the rectal cancer. The patient saw Dr. Burr Medico from oncology. From my review of records it was recommended that he have combination radiation and chemotherapy but he could not afford the Xeloda and therefore never took that. He last saw Dr. Burr Medico in April 2019. In reviewing the records he canceled follow-up with her. He came to see me in the office recently with complaints of diarrhea. I attempted to do a flexible sigmoidoscopy on him as an outpatient a few days ago but could not see the mucosa because there was so much stool. My concern with his ongoing symptoms of diarrhea was that he still had rectal cancer or could have radiation proctitis. He came to the emergency room last night with ongoing complaints of diarrhea. He started having some bleeding and was admitted.  Review of Systems No chest pain or shortness of breath  Past Medical History:  Diagnosis Date  . Cancer ([place]) 09/29/2017   Rectal cvancer   History reviewed. No pertinent surgical history. Social History   Socioeconomic History  . Marital status: Married    Spouse name: Not on file  . Number of children: Not on file  . Years of education: Not on file  . Highest education level: Not on file  Occupational History  . Not on file  Social Needs  . Financial resource strain: Not on file  . Food insecurity:    Worry: Not on file    Inability: Not on file  . Transportation needs:    Medical: Not on file    Non-medical: Not on file  Tobacco Use  . Smoking status: Former Smoker    Packs/day: 0.50    Years: 10.00    Pack years: 5.00    Last attempt to quit: 1988    Years since quitting: 32.0  . Smokeless tobacco: Never Used  Substance  and Sexual Activity  . Alcohol use: Yes    Comment: ocassionally   . Drug use: No  . Sexual activity: Not on file  Lifestyle  . Physical activity:    Days per week: Not on file    Minutes per session: Not on file  . Stress: Not on file  Relationships  . Social connections:    Talks on phone: Not on file    Gets together: Not on file    Attends religious service: Not on file    Active member of club or organization: Not on file    Attends meetings of clubs or organizations: Not on file    Relationship status: Not on file  . Intimate partner violence:    Fear of current or ex partner: Not on file    Emotionally abused: Not on file    Physically abused: Not on file    Forced sexual activity: Not on file  Other Topics Concern  . Not on file  Social History Narrative  . Not on file   family history includes Cancer in his mother.  Current Facility-Administered Medications:  .  0.9 %  sodium chloride infusion, 250 mL, Intravenous, PRN, Derrill Kay A, MD .  sodium chloride flush (NS) 0.9 % injection 3 mL, 3 mL, Intravenous, Q12H, Phillips Grout, MD .  sodium  chloride flush (NS) 0.9 % injection 3 mL, 3 mL, Intravenous, PRN, Phillips Grout, MD No Known Allergies   Objective:     BP (!) 143/90 (BP Location: Left Arm)   Pulse (!) 55   Temp 98.5 F (36.9 C) (Oral)   Resp 18   SpO2 100%   He is alert and in no acute distress  Heart regular rhythm no murmurs  Lungs clear  Abdomen soft and nontender  The patient had a CT when he was in the emergency room. There is wall thickening and enhancement of the rectum and distal sigmoid consistent with known rectal carcinoma. New right  attenuating lesion in right hepatic lobe concerning for metastatic disease, increased size of right lung base mass consistent with metastatic disease.  Laboratory No components found for: D1    Assessment:     Rectal cancer  Diarrhea  Rectal bleeding  Evidence of metastatic disease       Plan:     I will go ahead and try to do a sigmoidoscopy to see whether there is evidence of rectal cancer still or if he has radiation proctitis. He will need oncology opinion again.we may also need colorectal surgery's opinion again. Lab Results  Component Value Date   HGB 11.4 (L) 12/24/2018   HGB 14.0 03/22/2018   HGB 13.8 01/21/2018   HGB 12.9 (L) 12/07/2017   HGB 12.5 (L) 11/30/2017   HGB 13.0 11/19/2017   HCT 38.2 (L) 12/24/2018   HCT 42.3 03/22/2018   HCT 41.4 01/21/2018   HCT 39.5 12/07/2017   HCT 38.8 11/30/2017   HCT 41.1 11/19/2017   ALKPHOS 47 12/24/2018   ALKPHOS 71 03/22/2018   ALKPHOS 75 01/21/2018   ALKPHOS 58 12/07/2017   ALKPHOS 55 11/30/2017   ALKPHOS 61 11/19/2017   AST 18 12/24/2018   AST 13 03/22/2018   AST 13 01/21/2018   AST 16 12/07/2017   AST 24 11/30/2017   AST 20 11/19/2017   ALT 15 12/24/2018   ALT 11 03/22/2018   ALT 9 01/21/2018   ALT 18 12/07/2017   ALT 18 11/30/2017   ALT 19 11/19/2017

## 2018-12-25 NOTE — Plan of Care (Signed)
  Problem: Education: Goal: Knowledge of General Education information will improve Description Including pain rating scale, medication(s)/side effects and non-pharmacologic comfort measures Outcome: Progressing   Problem: Clinical Measurements: Goal: Ability to maintain clinical measurements within normal limits will improve Outcome: Progressing Goal: Will remain free from infection Outcome: Progressing Goal: Diagnostic test results will improve Outcome: Progressing Goal: Respiratory complications will improve Outcome: Progressing Goal: Cardiovascular complication will be avoided Outcome: Progressing   Problem: Activity: Goal: Risk for activity intolerance will decrease Outcome: Progressing   Problem: Nutrition: Goal: Adequate nutrition will be maintained Outcome: Progressing   Problem: Coping: Goal: Level of anxiety will decrease Outcome: Progressing   Problem: Elimination: Goal: Will not experience complications related to bowel motility Outcome: Progressing Goal: Will not experience complications related to urinary retention Outcome: Progressing   Problem: Safety: Goal: Ability to remain free from injury will improve Outcome: Progressing

## 2018-12-25 NOTE — Op Note (Signed)
[facility] Patient Name: Adrian Banks Procedure Date : 12/25/2018 MRN: [id number] Attending MD: Wonda Horner , MD Date of Birth: [birth year] CSN: [id number] Age: 85 Admit Type: Inpatient Procedure:                Flexible Sigmoidoscopy Indications:              Personal history of malignant rectal neoplasm Providers:                Wonda Horner, MD, Angus Seller, Cherylynn Ridges,                            Technician Referring MD:              Medicines:                None Complications:            No immediate complications. Estimated Blood Loss:     Estimated blood loss was minimal. Procedure:                Pre-Anesthesia Assessment:                           - Prior to the procedure, a History and Physical                            was performed, and patient medications and                            allergies were reviewed. The patient's tolerance of                            previous anesthesia was also reviewed. The risks                            and benefits of the procedure and the sedation                            options and risks were discussed with the patient.                            All questions were answered, and informed consent                            was obtained. Prior Anticoagulants: The patient has                            taken no previous anticoagulant or antiplatelet                            agents. ASA Grade Assessment: III - A patient with                            severe systemic disease. After reviewing the risks  and benefits, the patient was deemed in                            satisfactory condition to undergo the procedure.                           After obtaining informed consent, the scope was                            passed under direct vision. The PCF-H190DL                            ([id number] Olympus pediatric colonscope was                            introduced through the anus  and advanced to the the                            sigmoid colon. The flexible sigmoidoscopy was                            accomplished without difficulty. The patient                            tolerated the procedure well. The quality of the                            bowel preparation was un prepped. Scope In: Scope Out: Findings:      A non-obstructing large mass was found in the proximal rectum, in the       mid rectum and in the recto-sigmoid colon. Ot extended up to 20cm. This       was biopsied with a cold forceps for histology. This is consistent with       his known rectal cancer. Impression:               - Malignant tumor in the proximal rectum, in the                            mid rectum and in the recto-sigmoid colon. Biopsied. Recommendation:           - Resume regular diet.                           - Continue present medications.                           - Refer to an oncologist.                           - Refer to a colo-rectal surgeon. Procedure Code(s):        --- Professional ---                           [phone], Sigmoidoscopy, flexible; with biopsy, single  or multiple Diagnosis Code(s):        --- Professional ---                           C20, Malignant neoplasm of rectum                           C19, Malignant neoplasm of rectosigmoid junction                           Z85.048, Personal history of other malignant                            neoplasm of rectum, rectosigmoid junction, and anus CPT copyright 2018 [employer]. All rights reserved. The codes documented in this report are preliminary and upon coder review may  be revised to meet current compliance requirements. Wonda Horner, MD 12/25/2018 11:06:39 AM This report has been signed electronically. Number of Addenda: 0

## 2018-12-25 NOTE — ED Notes (Signed)
Pt has had several episodes of bloody diarrhea post CT scan. No LOC. Denies lightheadedness or dizziness.

## 2018-12-25 NOTE — Progress Notes (Signed)
  PROGRESS NOTE  Patient admitted earlier this morning. See H&P. Adrian Banks is a 85 y.o. male with medical history significant of rectal cancer status post radiation now coming into hospital due to diarrhea that is worsening over the last couple months.  He has been seen by GI who had planned on scoping him. In the ED, he started to have episodes of lower GI bleeding. GI was consulted on admission. CT A/P revealed wall thickening and enhancement of the rectum and distal sigmoid colon, consistent with known rectal carcinoma, new hypoattenuating lesion in the right hepatic lobe, concerning for metastatic disease, increased size of posterior right lung base mass, multiple right lower lobe pulmonary nodules, worsened since 10/28/2017, and consistent with metastatic disease. Records reviewed from Dr. Ernestina Penna exam 03/2018. He had previously declined surgical resection and lung nodule biopsy (to confirm metastasis and for restaging).   Patient complaining of black loose stools this morning.   GI consulted at time of admission Trend H&H  NPO  Palliative care consulted for goals of care discussion   Dessa Phi, DO [facility] [email or web address] 12/25/2018, 9:27 AM

## 2018-12-25 NOTE — Progress Notes (Signed)
Dr. Penelope Coop called to discuss this patient and his findings with the general surgery service.  His case has been discussed with Dr. Dema Severin.  He was diagnosed with rectal cancer in 2018 and saw Dr. Burr Medico for what was recommended as chemo/radiation treatment.  Unfortunately, he only got radiation treatment and then stopped following up in April 2019.  He began having diarrhea recently and follow up with Dr. Penelope Coop.  He attempted a flex sig 2 days ago, but due to stool burden couldn't see anything.  He then decided to come to the ED for evaluation where he was admitted.  He underwent flex sig today and was found to have persistent rectal tumor burden and a scan reveals lesions in his liver and lung, concerning for metastatic disease.  At this time, no surgical intervention is recommended.  We recommend having oncology evaluate the patient for further recommendations.  He is not obstructed or bleeding and no need for intervention at this time.  He may follow up with Dr. Leighton Ruff as indicated by oncology as he saw her in the past prior to oncologic treatment.  No further recommendations at this time.  Henreitta Cea 12:06 PM 12/25/2018

## 2018-12-25 NOTE — H&P (Signed)
History and Physical    Adrian Banks FBP:[id number] DOB: 1932/01/27 DOA: 12/24/2018  PCP: Aletha Halim., PA-C  Patient coming from: Home  Chief Complaint: Rectal bleeding  HPI: Adrian Banks is a 85 y.o. male with medical history significant of rectal cancer status post radiation finished that about 4 months ago who is a very poor historian comes in with diarrhea that is worsening over the last couple months.  He has been seen by GI who is planning on scoping him.  He comes in tonight was in the process of being discharged then he started having rectal bleeding that just developed about 30 minutes ago.  His hemoglobin is over 11 and his vitals are stable.  He denies any pain he denies any rectal pain he denies any nausea or vomiting.  Patient be referred for admission for GI bleed likely secondary to his rectal cancer.   Review of Systems: As per HPI otherwise 10 point review of systems negative.   Past Medical History:  Diagnosis Date  . Cancer ([place]) 09/29/2017   Rectal cvancer    History reviewed. No pertinent surgical history.   reports that he quit smoking about 32 years ago. He has a 5.00 pack-year smoking history. He has never used smokeless tobacco. He reports current alcohol use. He reports that he does not use drugs.  No Known Allergies  Family History  Problem Relation Age of Onset  . Cancer Mother        cancer (unknown type)    Prior to Admission medications   Medication Sig Start Date End Date Taking? Authorizing Provider  finasteride (PROSCAR) 5 MG tablet Take 5 mg by mouth daily.    Yes [provider]  lovastatin (MEVACOR) 40 MG tablet Take 40 mg by mouth daily.    Yes [provider]  mesalamine (ROWASA) 4 g enema Place 4 g rectally as directed.  12/07/18  Yes [provider]  capecitabine (XELODA) 500 MG tablet Take 3 tablets (1500mg ) by mouth 2 times daily, within 30 minutes of food, on radiation days only, M-F Patient not  taking: Reported on 12/24/2018 10/26/17   Truitt Merle, MD  nitrofurantoin, macrocrystal-monohydrate, (MACROBID) 100 MG capsule Take 1 capsule (100 mg total) by mouth 2 (two) times daily. Patient not taking: Reported on 12/24/2018 11/27/17   Kyung Rudd, MD    Physical Exam: Vitals:   12/25/18 0109 12/25/18 0111 12/25/18 0115 12/25/18 0130  BP: (!) 142/84  136/86 138/89  Pulse:  70 63 70  Resp:      Temp:      TempSrc:      SpO2:  100% 100% 100%      Constitutional: NAD, calm, comfortable Vitals:   12/25/18 0109 12/25/18 0111 12/25/18 0115 12/25/18 0130  BP: (!) 142/84  136/86 138/89  Pulse:  70 63 70  Resp:      Temp:      TempSrc:      SpO2:  100% 100% 100%   Eyes: PERRL, lids and conjunctivae normal ENMT: Mucous membranes are moist. Posterior pharynx clear of any exudate or lesions.Normal dentition.  Neck: normal, supple, no masses, no thyromegaly Respiratory: clear to auscultation bilaterally, no wheezing, no crackles. Normal respiratory effort. No accessory muscle use.  Cardiovascular: Regular rate and rhythm, no murmurs / rubs / gallops. No extremity edema. 2+ pedal pulses. No carotid bruits.  Abdomen: no tenderness, no masses palpated. No hepatosplenomegaly. Bowel sounds positive.  Musculoskeletal: no clubbing / cyanosis. No joint  deformity upper and lower extremities. Good ROM, no contractures. Normal muscle tone.  Skin: no rashes, lesions, ulcers. No induration Neurologic: CN 2-12 grossly intact. Sensation intact, DTR normal. Strength 5/5 in all 4.  Psychiatric: Normal judgment and insight. Alert and oriented x 3. Normal mood.    Labs on Admission: I have personally reviewed following labs and imaging studies  CBC: Recent Labs  Lab 12/24/18 2132  WBC 11.0*  HGB 11.4*  HCT 38.2*  MCV 85.5  PLT 782   Basic Metabolic Panel: Recent Labs  Lab 12/24/18 2132  NA 142  K 3.7  CL 110  CO2 21*  GLUCOSE 88  BUN 18  CREATININE 1.08  CALCIUM 8.8*   GFR: CrCl  cannot be calculated (Unknown ideal weight.). Liver Function Tests: Recent Labs  Lab 12/24/18 2132  AST 18  ALT 15  ALKPHOS 47  BILITOT 0.4  PROT 6.5  ALBUMIN 3.4*   Recent Labs  Lab 12/24/18 2132  LIPASE 23   No results for input(s): AMMONIA in the last 168 hours. Coagulation Profile: No results for input(s): INR, PROTIME in the last 168 hours. Cardiac Enzymes: No results for input(s): CKTOTAL, CKMB, CKMBINDEX, TROPONINI in the last 168 hours. BNP (last 3 results) No results for input(s): PROBNP in the last 8760 hours. HbA1C: No results for input(s): HGBA1C in the last 72 hours. CBG: No results for input(s): GLUCAP in the last 168 hours. Lipid Profile: No results for input(s): CHOL, HDL, LDLCALC, TRIG, CHOLHDL, LDLDIRECT in the last 72 hours. Thyroid Function Tests: No results for input(s): TSH, T4TOTAL, FREET4, T3FREE, THYROIDAB in the last 72 hours. Anemia Panel: No results for input(s): VITAMINB12, FOLATE, FERRITIN, TIBC, IRON, RETICCTPCT in the last 72 hours. Urine analysis:    Component Value Date/Time   COLORURINE YELLOW 12/24/2018 2128   APPEARANCEUR HAZY (A) 12/24/2018 2128   LABSPEC 1.024 12/24/2018 2128   LABSPEC 1.025 11/27/2017 1208   PHURINE 5.0 12/24/2018 2128   GLUCOSEU NEGATIVE 12/24/2018 2128   GLUCOSEU Negative 11/27/2017 1208   HGBUR LARGE (A) 12/24/2018 2128   BILIRUBINUR NEGATIVE 12/24/2018 2128   BILIRUBINUR Negative 11/27/2017 1208   KETONESUR 5 (A) 12/24/2018 2128   PROTEINUR NEGATIVE 12/24/2018 2128   UROBILINOGEN 0.2 11/27/2017 1208   NITRITE NEGATIVE 12/24/2018 2128   LEUKOCYTESUR NEGATIVE 12/24/2018 2128   LEUKOCYTESUR Moderate 11/27/2017 1208   Sepsis Labs: !!!!!!!!!!!!!!!!!!!!!!!!!!!!!!!!!!!!!!!!!!!! @LABRCNTIP (procalcitonin:4,lacticidven:4) )No results found for this or any previous visit (from the past 240 hour(s)).   Radiological Exams on Admission: Ct Abdomen Pelvis W Contrast  Result Date: 12/25/2018 CLINICAL DATA:   Diarrhea EXAM: CT ABDOMEN AND PELVIS WITH CONTRAST TECHNIQUE: Multidetector CT imaging of the abdomen and pelvis was performed using the standard protocol following bolus administration of intravenous contrast. CONTRAST:  141mL OMNIPAQUE IOHEXOL 300 MG/ML  SOLN COMPARISON:  CT abdomen pelvis 10/28/2017 FINDINGS: LOWER CHEST: There is a posterior right lung base mass measuring 5.4 x 3.8 cm, previously 1.8 x 2.0 cm. There are multiple right lower lobe nodules, measuring up to 2.5 cm. Two of the nodules show cavitation. These findings have worsened since 10/28/2017. HEPATOBILIARY: There is a hypoattenuating lesion in the right hepatic lobe that measures 1.4 cm and is new compared to the prior studies. Remainder of the liver is normal. The gallbladder is normal. PANCREAS: The pancreatic parenchymal contours are normal and there is no ductal dilatation. There is no peripancreatic fluid collection. SPLEEN: Normal. ADRENALS/URINARY TRACT: --Adrenal glands: Normal. --Right kidney/ureter: There are multiple renal cysts, measuring up to  7 cm. The distribution of lesions is unchanged. --Left kidney/ureter: There are multiple renal cysts, the largest of which measures 2.4 cm, unchanged. --Urinary bladder: Multiple large calculi within the urinary bladder. STOMACH/BOWEL: --Stomach/Duodenum: Intermediate sized hiatal hernia. --Small bowel: No dilatation or inflammation. --Colon: There is wall thickening and enhancement of the rectum and distal sigmoid colon. --Appendix: Normal. VASCULAR/LYMPHATIC: There is aortic atherosclerosis without hemodynamically significant stenosis. The portal vein, splenic vein, superior mesenteric vein and IVC are patent. 5 mm right pelvic sidewall node has decreased in size since the prior study. REPRODUCTIVE: Enlarged prostate measures 5.4 x 4.7 cm. MUSCULOSKELETAL. Multilevel degenerative disc disease and facet arthrosis. No bony spinal canal stenosis. OTHER: None. IMPRESSION: 1. Wall thickening and  enhancement of the rectum and distal sigmoid colon, consistent with known rectal carcinoma. 2. New hypoattenuating lesion in the right hepatic lobe, concerning for metastatic disease. 3. Increased size of posterior right lung base mass, now measuring 5.4 x 3.8 cm, previously 1.8 x 2.0 cm. Multiple right lower lobe pulmonary nodules, worsened since 10/28/2017, and consistent with metastatic disease. 4. Multiple large calculi within the urinary bladder. Electronically Signed   By: Ulyses Jarred M.D.   On: 12/25/2018 04:35   Old chart reviewed Case discussed with Dr. Leonette Monarch in the ED  Assessment/Plan 85 year old male with history rectal cancer comes in and has developed a GI bleed while he is waiting in the emergency department Principal Problem:   GIB (gastrointestinal bleeding)-hold anticoagulants.  GI consulted and will see patient.  Keep n.p.o. for scoping.  Likely secondary to rectal cancer.  Patient aware.  Active Problems:   Rectal cancer (HCC)-as above    Iron deficiency anemia due to chronic blood loss-noted    DVT prophylaxis: SCDs Code Status: Full Family Communication: None Disposition Plan: 1 to 2 days Consults called: GI Dr. Bing Plume Admission status: Observation   Stayce Delancy A MD [facility]  If 7PM-7AM, please contact night-coverage [email or web address] Password [email or web address]  12/25/2018, 5:25 AM

## 2018-12-25 NOTE — ED Provider Notes (Signed)
[facility] EMERGENCY DEPARTMENT Provider Note  CSN: [id number] Arrival date & time: 12/24/18 2047  Chief Complaint(s) Diarrhea  HPI Adrian Banks is a 85 y.o. male with a history of rectal cancer status post radiation who presents to the emergency department with persistent diarrhea since starting radiation 1 year ago.  He reports that his stool is black and runny.  Has been taking Imodium which provides very mild relief but was instructed to stop taking the Imodium by his gastroenterologist last week due to inability to see his rectum with a sigmoidoscopy.  Since then he has had an increase in his bowel movements.  He denies any fevers, chills, nausea, vomiting, abdominal pain.  Denies any other physical complaints.  HPI  Past Medical History Past Medical History:  Diagnosis Date  . Cancer ([place]) 09/29/2017   Rectal cvancer   Patient Active Problem List   Diagnosis Date Noted  . Goals of care, counseling/discussion 11/02/2017  . Iron deficiency anemia due to chronic blood loss 10/23/2017  . Rectal cancer ([place]) 10/21/2017   Home Medication(s) Prior to Admission medications   Medication Sig Start Date End Date Taking? Authorizing Provider  finasteride (PROSCAR) 5 MG tablet Take 5 mg by mouth daily.    Yes [provider]  lovastatin (MEVACOR) 40 MG tablet Take 40 mg by mouth daily.    Yes [provider]  mesalamine (ROWASA) 4 g enema Place 4 g rectally as directed.  12/07/18  Yes [provider]  capecitabine (XELODA) 500 MG tablet Take 3 tablets (1500mg ) by mouth 2 times daily, within 30 minutes of food, on radiation days only, M-F Patient not taking: Reported on 12/24/2018 10/26/17   Truitt Merle, MD  nitrofurantoin, macrocrystal-monohydrate, (MACROBID) 100 MG capsule Take 1 capsule (100 mg total) by mouth 2 (two) times daily. Patient not taking: Reported on 12/24/2018 11/27/17   Kyung Rudd, MD                                                                                          Past Surgical History History reviewed. No pertinent surgical history. Family History Family History  Problem Relation Age of Onset  . Cancer Mother        cancer (unknown type)    Social History Social History   Tobacco Use  . Smoking status: Former Smoker    Packs/day: 0.50    Years: 10.00    Pack years: 5.00    Last attempt to quit: 1988    Years since quitting: 32.0  . Smokeless tobacco: Never Used  Substance Use Topics  . Alcohol use: Yes    Comment: ocassionally   . Drug use: No   Allergies Patient has no known allergies.  Review of Systems Review of Systems All other systems are reviewed and are negative for acute change except as noted in the HPI  Physical Exam Vital Signs  I have reviewed the triage vital signs BP 138/89   Pulse 70   Temp 98.5 F (36.9 C) (Oral)   Resp 18   SpO2 100%   Physical Exam Vitals signs reviewed.  Constitutional:  General: He is not in acute distress.    Appearance: He is well-developed. He is not diaphoretic.  HENT:     Head: Normocephalic and atraumatic.     Jaw: No trismus.     Right Ear: External ear normal.     Left Ear: External ear normal.     Nose: Nose normal.  Eyes:     General: No scleral icterus.    Conjunctiva/sclera: Conjunctivae normal.  Neck:     Musculoskeletal: Normal range of motion.     Trachea: Phonation normal.  Cardiovascular:     Rate and Rhythm: Normal rate and regular rhythm.  Pulmonary:     Effort: Pulmonary effort is normal. No respiratory distress.     Breath sounds: No stridor.  Abdominal:     General: There is no distension.     Tenderness: There is no abdominal tenderness.  Musculoskeletal: Normal range of motion.  Neurological:     Mental Status: He is alert and oriented to person, place, and time.  Psychiatric:        Behavior: Behavior normal.     ED Results and  Treatments Labs (all labs ordered are listed, but only abnormal results are displayed) Labs Reviewed  COMPREHENSIVE METABOLIC PANEL - Abnormal; Notable for the following components:      Result Value   CO2 21 (*)    Calcium 8.8 (*)    Albumin 3.4 (*)    All other components within normal limits  CBC - Abnormal; Notable for the following components:   WBC 11.0 (*)    Hemoglobin 11.4 (*)    HCT 38.2 (*)    MCH 25.5 (*)    MCHC 29.8 (*)    RDW 15.9 (*)    All other components within normal limits  URINALYSIS, ROUTINE W REFLEX MICROSCOPIC - Abnormal; Notable for the following components:   APPearance HAZY (*)    Hgb urine dipstick LARGE (*)    Ketones, ur 5 (*)    RBC / HPF >50 (*)    Bacteria, UA RARE (*)    All other components within normal limits  LIPASE, BLOOD                                                                                                                         EKG  EKG Interpretation  Date/Time:    Ventricular Rate:    PR Interval:    QRS Duration:   QT Interval:    QTC Calculation:   R Axis:     Text Interpretation:        Radiology Ct Abdomen Pelvis W Contrast  Result Date: 12/25/2018 CLINICAL DATA:  Diarrhea EXAM: CT ABDOMEN AND PELVIS WITH CONTRAST TECHNIQUE: Multidetector CT imaging of the abdomen and pelvis was performed using the standard protocol following bolus administration of intravenous contrast. CONTRAST:  182mL OMNIPAQUE IOHEXOL 300 MG/ML  SOLN COMPARISON:  CT abdomen pelvis 10/28/2017 FINDINGS: LOWER CHEST: There is a posterior right lung  base mass measuring 5.4 x 3.8 cm, previously 1.8 x 2.0 cm. There are multiple right lower lobe nodules, measuring up to 2.5 cm. Two of the nodules show cavitation. These findings have worsened since 10/28/2017. HEPATOBILIARY: There is a hypoattenuating lesion in the right hepatic lobe that measures 1.4 cm and is new compared to the prior studies. Remainder of the liver is normal. The gallbladder is  normal. PANCREAS: The pancreatic parenchymal contours are normal and there is no ductal dilatation. There is no peripancreatic fluid collection. SPLEEN: Normal. ADRENALS/URINARY TRACT: --Adrenal glands: Normal. --Right kidney/ureter: There are multiple renal cysts, measuring up to 7 cm. The distribution of lesions is unchanged. --Left kidney/ureter: There are multiple renal cysts, the largest of which measures 2.4 cm, unchanged. --Urinary bladder: Multiple large calculi within the urinary bladder. STOMACH/BOWEL: --Stomach/Duodenum: Intermediate sized hiatal hernia. --Small bowel: No dilatation or inflammation. --Colon: There is wall thickening and enhancement of the rectum and distal sigmoid colon. --Appendix: Normal. VASCULAR/LYMPHATIC: There is aortic atherosclerosis without hemodynamically significant stenosis. The portal vein, splenic vein, superior mesenteric vein and IVC are patent. 5 mm right pelvic sidewall node has decreased in size since the prior study. REPRODUCTIVE: Enlarged prostate measures 5.4 x 4.7 cm. MUSCULOSKELETAL. Multilevel degenerative disc disease and facet arthrosis. No bony spinal canal stenosis. OTHER: None. IMPRESSION: 1. Wall thickening and enhancement of the rectum and distal sigmoid colon, consistent with known rectal carcinoma. 2. New hypoattenuating lesion in the right hepatic lobe, concerning for metastatic disease. 3. Increased size of posterior right lung base mass, now measuring 5.4 x 3.8 cm, previously 1.8 x 2.0 cm. Multiple right lower lobe pulmonary nodules, worsened since 10/28/2017, and consistent with metastatic disease. 4. Multiple large calculi within the urinary bladder. Electronically Signed   By: Ulyses Jarred M.D.   On: 12/25/2018 04:35   Pertinent labs & imaging results that were available during my care of the patient were reviewed by me and considered in my medical decision making (see chart for details).  Medications Ordered in ED Medications  sodium  chloride flush (NS) 0.9 % injection 3 mL (has no administration in time range)  iohexol (OMNIPAQUE) 300 MG/ML solution 100 mL (100 mLs Intravenous Contrast Given 12/25/18 0355)                                                                                                                                    Procedures Procedures  (including critical care time)  Medical Decision Making / ED Course I have reviewed the nursing notes for this encounter and the patient's prior records (if available in EHR or on provided paperwork).    Patient presents with chronic diarrhea in the setting of rectal cancer status post radiation therapy.  Labs notable for mild leukocytosis with a 3 g hemoglobin drop in 9 months.  Patient denied any overt blood.  Rest of the labs are grossly reassuring without significant electrolyte derangements or renal  insufficiency.  I discussed the case with Dr. Penelope Coop from [facility] gastroenterology and we decided to obtain a CT of the abdomen to assess for possible recurrence of the cancer as well as to assess possibility of constipation with overflow incontinence.  CT CAT scan revealed perirectal wall thickening consistent with recurrence of the rectal cancer.  During the stay in the emergency department the patient began having frank red blood with bowel movements.  Given this change, will admit the patient for further work-up and management.   Final Clinical Impression(s) / ED Diagnoses Final diagnoses:  Rectal cancer ([place])  Lower GI bleed      This chart was dictated using voice recognition software.  Despite best efforts to proofread,  errors can occur which can change the documentation meaning.   Fatima Blank, MD 12/25/18 [phone]

## 2018-12-25 NOTE — Consult Note (Addendum)
CC: Consult by Dr. Penelope Coop for probable metastatic rectal cancer  HPI: Adrian Banks is an 85 y.o. male (poor historian) who is here for with hx of upper rectal cancer diagnosed 09/2017. He was seen and evaluated and found to have findigns concerning for metastatic disease in the liver and chest. He was given chemoXRT which he completed 03/2018. Long discussions were had with medical oncology including workup of possible rectal cancer metastasis vs lung primary and he declined. He was therefore moved to observation. He returned to the ED with diarrhea and mucus in his stool. He denies any abdominal pain, n/v.  He underwent repeat flex sig today which demonstrated his known rectal mass; not obstructing the lumen.  CT A/P done today demonstrated: 1. Wall thickening and enhancement of the rectum and distal sigmoid colon, consistent with known rectal carcinoma. 2. New hypoattenuating lesion in the right hepatic lobe, concerning for metastatic disease. 3. Increased size of posterior right lung base mass, now measuring 5.4 x 3.8 cm, previously 1.8 x 2.0 cm. Multiple right lower lobe pulmonary nodules, worsened since 10/28/2017, and consistent with metastatic disease. 4. Multiple large calculi within the urinary bladder.  We were consulted.   Past Medical History:  Diagnosis Date  . Cancer ([place]) 09/29/2017   Rectal cvancer    History reviewed. No pertinent surgical history.  Family History  Problem Relation Age of Onset  . Cancer Mother        cancer (unknown type)    Social:  reports that he quit smoking about 32 years ago. He has a 5.00 pack-year smoking history. He has never used smokeless tobacco. He reports current alcohol use. He reports that he does not use drugs.  Allergies: No Known Allergies  Medications: I have reviewed the patient's current medications.  Results for orders placed or performed during the hospital encounter of 12/24/18 (from the past 48 hour(s))    Urinalysis, Routine w reflex microscopic     Status: Abnormal   Collection Time: 12/24/18  9:28 PM  Result Value Ref Range   Color, Urine YELLOW YELLOW   APPearance HAZY (A) CLEAR   Specific Gravity, Urine 1.024 1.005 - 1.030   pH 5.0 5.0 - 8.0   Glucose, UA NEGATIVE NEGATIVE mg/dL   Hgb urine dipstick LARGE (A) NEGATIVE   Bilirubin Urine NEGATIVE NEGATIVE   Ketones, ur 5 (A) NEGATIVE mg/dL   Protein, ur NEGATIVE NEGATIVE mg/dL   Nitrite NEGATIVE NEGATIVE   Leukocytes, UA NEGATIVE NEGATIVE   RBC / HPF >50 (H) 0 - 5 RBC/hpf   Bacteria, UA RARE (A) NONE SEEN   Squamous Epithelial / LPF 0-5 0 - 5   Mucus PRESENT     Comment: Performed at [facility], [address] [place], [place] [address]  Lipase, blood     Status: None   Collection Time: 12/24/18  9:32 PM  Result Value Ref Range   Lipase 23 11 - 51 U/L    Comment: Performed at [facility] [address], [place], [place] [address]  Comprehensive metabolic panel     Status: Abnormal   Collection Time: 12/24/18  9:32 PM  Result Value Ref Range   Sodium 142 135 - 145 mmol/L   Potassium 3.7 3.5 - 5.1 mmol/L   Chloride 110 98 - 111 mmol/L   CO2 21 (L) 22 - 32 mmol/L   Glucose, Bld 88 70 - 99 mg/dL   BUN 18 8 - 23 mg/dL  Creatinine, Ser 1.08 0.61 - 1.24 mg/dL   Calcium 8.8 (L) 8.9 - 10.3 mg/dL   Total Protein 6.5 6.5 - 8.1 g/dL   Albumin 3.4 (L) 3.5 - 5.0 g/dL   AST 18 15 - 41 U/L   ALT 15 0 - 44 U/L   Alkaline Phosphatase 47 38 - 126 U/L   Total Bilirubin 0.4 0.3 - 1.2 mg/dL   GFR calc non Af Amer >60 >60 mL/min   GFR calc Af Amer >60 >60 mL/min   Anion gap 11 5 - 15    Comment: Performed at  [facility] [address], [place], [place] [address]  CBC     Status: Abnormal   Collection Time: 12/24/18  9:32 PM  Result Value Ref Range   WBC 11.0 (H) 4.0 - 10.5 K/uL   RBC 4.47 4.22 - 5.81 MIL/uL   Hemoglobin 11.4 (L) 13.0 - 17.0 g/dL   HCT 38.2 (L) 39.0 - 52.0 %   MCV 85.5 80.0 - 100.0 fL   MCH  25.5 (L) 26.0 - 34.0 pg   MCHC 29.8 (L) 30.0 - 36.0 g/dL   RDW 15.9 (H) 11.5 - 15.5 %   Platelets 251 150 - 400 K/uL   nRBC 0.0 0.0 - 0.2 %    Comment: Performed at [facility] [address], [place], [place] [address]    Ct Abdomen Pelvis W Contrast  Result Date: 12/25/2018 CLINICAL DATA:  Diarrhea EXAM: CT ABDOMEN AND PELVIS WITH CONTRAST TECHNIQUE: Multidetector CT imaging of the abdomen and pelvis was performed using the standard protocol following bolus administration of intravenous contrast. CONTRAST:  177mL OMNIPAQUE IOHEXOL 300 MG/ML  SOLN COMPARISON:  CT abdomen pelvis 10/28/2017 FINDINGS: LOWER CHEST: There is a posterior right lung base mass measuring 5.4 x 3.8 cm, previously 1.8 x 2.0 cm. There are multiple right lower lobe nodules, measuring up to 2.5 cm. Two of the nodules show cavitation. These findings have worsened since 10/28/2017. HEPATOBILIARY: There is a hypoattenuating lesion in the right hepatic lobe that measures 1.4 cm and is new compared to the prior studies. Remainder of the liver is normal. The gallbladder is normal. PANCREAS: The pancreatic parenchymal contours are normal and there is no ductal dilatation. There is no peripancreatic fluid collection. SPLEEN: Normal. ADRENALS/URINARY TRACT: --Adrenal glands: Normal. --Right kidney/ureter: There are multiple renal cysts, measuring up to 7 cm. The distribution of lesions is unchanged. --Left kidney/ureter: There are multiple renal cysts, the largest of which measures 2.4 cm, unchanged. --Urinary bladder: Multiple large calculi within the urinary bladder. STOMACH/BOWEL: --Stomach/Duodenum: Intermediate sized hiatal hernia. --Small bowel: No dilatation or inflammation. --Colon: There is wall thickening and enhancement of the rectum and distal sigmoid colon. --Appendix: Normal. VASCULAR/LYMPHATIC: There is aortic atherosclerosis without hemodynamically significant stenosis. The portal vein, splenic vein, superior mesenteric  vein and IVC are patent. 5 mm right pelvic sidewall node has decreased in size since the prior study. REPRODUCTIVE: Enlarged prostate measures 5.4 x 4.7 cm. MUSCULOSKELETAL. Multilevel degenerative disc disease and facet arthrosis. No bony spinal canal stenosis. OTHER: None. IMPRESSION: 1. Wall thickening and enhancement of the rectum and distal sigmoid colon, consistent with known rectal carcinoma. 2. New hypoattenuating lesion in the right hepatic lobe, concerning for metastatic disease. 3. Increased size of posterior right lung base mass, now measuring 5.4 x 3.8 cm, previously 1.8 x 2.0 cm. Multiple right lower lobe pulmonary nodules, worsened since 10/28/2017, and consistent with metastatic disease. 4. Multiple large calculi within the  urinary bladder. Electronically Signed   By: Ulyses Jarred M.D.   On: 12/25/2018 04:35    ROS - all of the below systems have been reviewed with the patient and positives are indicated with bold text General: chills, fever or night sweats Eyes: blurry vision or double vision ENT: epistaxis or sore throat Allergy/Immunology: itchy/watery eyes or nasal congestion Hematologic/Lymphatic: bleeding problems, blood clots or swollen lymph nodes Endocrine: temperature intolerance or unexpected weight changes Breast: new or changing breast lumps or nipple discharge Resp: cough, shortness of breath, or wheezing CV: chest pain or dyspnea on exertion GI: as per HPI GU: dysuria, trouble voiding, or hematuria MSK: joint pain or joint stiffness Neuro: TIA or stroke symptoms Derm: pruritus and skin lesion changes Psych: anxiety and depression  PE Blood pressure (!) 144/69, pulse 71, temperature 98.8 F (37.1 C), temperature source Oral, resp. rate 16, SpO2 97 %. Constitutional: NAD; conversant; no deformities Eyes: Moist conjunctiva; no lid lag; anicteric; PERRL Neck: Trachea midline; no thyromegaly Lungs: Normal respiratory effort; no tactile fremitus CV: RRR; no  palpable thrills; no pitting edema GI: Abd soft, NT/ND; no palpable hepatosplenomegaly MSK: Normal gait; no clubbing/cyanosis Psychiatric: Appropriate affect; alert and oriented x3 Lymphatic: No palpable cervical or axillary lymphadenopathy  Results for orders placed or performed during the hospital encounter of 12/24/18 (from the past 48 hour(s))  Urinalysis, Routine w reflex microscopic     Status: Abnormal   Collection Time: 12/24/18  9:28 PM  Result Value Ref Range   Color, Urine YELLOW YELLOW   APPearance HAZY (A) CLEAR   Specific Gravity, Urine 1.024 1.005 - 1.030   pH 5.0 5.0 - 8.0   Glucose, UA NEGATIVE NEGATIVE mg/dL   Hgb urine dipstick LARGE (A) NEGATIVE   Bilirubin Urine NEGATIVE NEGATIVE   Ketones, ur 5 (A) NEGATIVE mg/dL   Protein, ur NEGATIVE NEGATIVE mg/dL   Nitrite NEGATIVE NEGATIVE   Leukocytes, UA NEGATIVE NEGATIVE   RBC / HPF >50 (H) 0 - 5 RBC/hpf   Bacteria, UA RARE (A) NONE SEEN   Squamous Epithelial / LPF 0-5 0 - 5   Mucus PRESENT     Comment: Performed at [facility], [address], [place], [place] [address]  Lipase, blood     Status: None   Collection Time: 12/24/18  9:32 PM  Result Value Ref Range   Lipase 23 11 - 51 U/L    Comment: Performed at [facility] [address], [place], [place] [address]  Comprehensive metabolic panel     Status: Abnormal   Collection Time: 12/24/18  9:32 PM  Result Value Ref Range   Sodium 142 135 - 145 mmol/L   Potassium 3.7 3.5 - 5.1 mmol/L   Chloride 110 98 - 111 mmol/L   CO2 21 (L) 22 - 32 mmol/L   Glucose, Bld 88 70 - 99 mg/dL   BUN 18 8 - 23 mg/dL   Creatinine, Ser 1.08 0.61 - 1.24 mg/dL   Calcium 8.8 (L) 8.9 - 10.3 mg/dL   Total Protein 6.5 6.5 - 8.1 g/dL   Albumin 3.4 (L) 3.5 - 5.0 g/dL   AST 18 15 - 41 U/L   ALT 15 0 - 44 U/L   Alkaline Phosphatase 47 38 - 126 U/L   Total Bilirubin 0.4 0.3 - 1.2 mg/dL   GFR calc non Af Amer >60 >60 mL/min   GFR calc Af Amer >60 >60 mL/min   Anion gap 11  5 -  15    Comment: Performed at [facility], [place] [address], [place], [place] [address]  CBC     Status: Abnormal   Collection Time: 12/24/18  9:32 PM  Result Value Ref Range   WBC 11.0 (H) 4.0 - 10.5 K/uL   RBC 4.47 4.22 - 5.81 MIL/uL   Hemoglobin 11.4 (L) 13.0 - 17.0 g/dL   HCT 38.2 (L) 39.0 - 52.0 %   MCV 85.5 80.0 - 100.0 fL   MCH 25.5 (L) 26.0 - 34.0 pg   MCHC 29.8 (L) 30.0 - 36.0 g/dL   RDW 15.9 (H) 11.5 - 15.5 %   Platelets 251 150 - 400 K/uL   nRBC 0.0 0.0 - 0.2 %    Comment: Performed at [facility] [address], [place], [place] [address]    Ct Abdomen Pelvis W Contrast  Result Date: 12/25/2018 CLINICAL DATA:  Diarrhea EXAM: CT ABDOMEN AND PELVIS WITH CONTRAST TECHNIQUE: Multidetector CT imaging of the abdomen and pelvis was performed using the standard protocol following bolus administration of intravenous contrast. CONTRAST:  146mL OMNIPAQUE IOHEXOL 300 MG/ML  SOLN COMPARISON:  CT abdomen pelvis 10/28/2017 FINDINGS: LOWER CHEST: There is a posterior right lung base mass measuring 5.4 x 3.8 cm, previously 1.8 x 2.0 cm. There are multiple right lower lobe nodules, measuring up to 2.5 cm. Two of the nodules show cavitation. These findings have worsened since 10/28/2017. HEPATOBILIARY: There is a hypoattenuating lesion in the right hepatic lobe that measures 1.4 cm and is new compared to the prior studies. Remainder of the liver is normal. The gallbladder is normal. PANCREAS: The pancreatic parenchymal contours are normal and there is no ductal dilatation. There is no peripancreatic fluid collection. SPLEEN: Normal. ADRENALS/URINARY TRACT: --Adrenal glands: Normal. --Right kidney/ureter: There are multiple renal cysts, measuring up to 7 cm. The distribution of lesions is unchanged. --Left kidney/ureter: There are multiple renal cysts, the largest of which measures 2.4 cm, unchanged. --Urinary bladder: Multiple large calculi within the urinary bladder. STOMACH/BOWEL:  --Stomach/Duodenum: Intermediate sized hiatal hernia. --Small bowel: No dilatation or inflammation. --Colon: There is wall thickening and enhancement of the rectum and distal sigmoid colon. --Appendix: Normal. VASCULAR/LYMPHATIC: There is aortic atherosclerosis without hemodynamically significant stenosis. The portal vein, splenic vein, superior mesenteric vein and IVC are patent. 5 mm right pelvic sidewall node has decreased in size since the prior study. REPRODUCTIVE: Enlarged prostate measures 5.4 x 4.7 cm. MUSCULOSKELETAL. Multilevel degenerative disc disease and facet arthrosis. No bony spinal canal stenosis. OTHER: None. IMPRESSION: 1. Wall thickening and enhancement of the rectum and distal sigmoid colon, consistent with known rectal carcinoma. 2. New hypoattenuating lesion in the right hepatic lobe, concerning for metastatic disease. 3. Increased size of posterior right lung base mass, now measuring 5.4 x 3.8 cm, previously 1.8 x 2.0 cm. Multiple right lower lobe pulmonary nodules, worsened since 10/28/2017, and consistent with metastatic disease. 4. Multiple large calculi within the urinary bladder. Electronically Signed   By: Ulyses Jarred M.D.   On: 12/25/2018 04:35   A/P: Adrian Banks is an 85 y.o. male with most likely metastatic rectal cancer - underwent chemoXRT and chemotherapy but stopped 03/2018 for personal preference and to pursue observation  -Agree with medical oncology evaluation to re-discuss everything with him. It's not clear he completely understood options moving forward. -No planned surgical intervention at this point; he has had some blood in his stool and has mild anemia; its likely this will continue; this may slow/resolve  with chemotherapy -Please let us know if additional questions or concerns arise  Sharon Mt. Dema Severin, M.D. [facility]

## 2018-12-26 DIAGNOSIS — K922 Gastrointestinal hemorrhage, unspecified: Secondary | ICD-10-CM | POA: Diagnosis not present

## 2018-12-26 LAB — BASIC METABOLIC PANEL
Anion gap: 10 (ref 5–15)
BUN: 20 mg/dL (ref 8–23)
CO2: 23 mmol/L (ref 22–32)
Calcium: 8.4 mg/dL — ABNORMAL LOW (ref 8.9–10.3)
Chloride: 108 mmol/L (ref 98–111)
Creatinine, Ser: 1 mg/dL (ref 0.61–1.24)
GFR calc Af Amer: >60 mL/min (ref >60)
GFR calc non Af Amer: >60 mL/min (ref >60)
Glucose, Bld: 88 mg/dL (ref 70–99)
Potassium: 4.3 mmol/L (ref 3.5–5.1)
SODIUM: 141 mmol/L (ref 135–145)

## 2018-12-26 LAB — HEMOGLOBIN AND HEMATOCRIT, BLOOD
HCT: 34 % — ABNORMAL LOW (ref 39.0–52.0)
HEMOGLOBIN: 10.7 g/dL — AB (ref 13.0–17.0)

## 2018-12-26 NOTE — Discharge Summary (Addendum)
Physician Discharge Summary  Adrian Banks:[id number] DOB: 01-Mar-1932 DOA: 12/24/2018  PCP: Aletha Halim., PA-C  Admit date: 12/24/2018 Discharge date: 12/26/2018  Admitted From: home Disposition:  home  Recommendations for Outpatient Follow-up:  1. Follow up with Dr. Burr Medico in 1-2 days. Discussed case with Dr. Burr Medico over the phone on day of discharge. She will follow up with him to discuss further care options for his metastatic disease.  2. Repeat CBC in 1 week to ensure stability of Hgb   Discharge Condition: Stable CODE STATUS: Full  Diet recommendation: Heart healthy   Brief/Interim Summary: Adrian Banks a 85 y.o.malewith medical history significant ofrectal cancer status post radiation now coming into hospital due to diarrhea that is worsening over the last couple months. He has been seen by GI who had planned on scoping him. In the ED, he started to have episodes of lower GI bleeding. GI was consulted on admission. CT A/P revealed wall thickening and enhancement of the rectum and distal sigmoid colon, consistent with known rectal carcinoma, new hypoattenuating lesion in the right hepatic lobe, concerning for metastatic disease, increased size of posterior right lung base mass, multiple right lower lobe pulmonary nodules, worsened since 10/28/2017, and consistent with metastatic disease. Records reviewed from Dr. Ernestina Penna exam 03/2018. He had previously declined surgical resection and lung nodule biopsy (to confirm metastasis and for restaging).   He underwent flex sigmoidoscopy on 1/18 which revealed non-obstructing large mass in rectum. General surgery advised patient follow up with oncology and re-consult with Dr. Marcello Moores as needed. On day of discharge, he reported that his diarrhea was persistent but same as previous, less melenotic. Hgb remained stable. Re-iterated to him the importance of follow up with Dr. Burr Medico. Discussed case with her as well.   Primary diagnosis: Lower  GI bleed Additional diagnosis: Diarrhea Rectal cancer Iron deficiency anemia due to chronic blood loss    Discharge Instructions  Discharge Instructions    Call MD for:  difficulty breathing, headache or visual disturbances   Complete by:  As directed    Call MD for:  extreme fatigue   Complete by:  As directed    Call MD for:  hives   Complete by:  As directed    Call MD for:  persistant dizziness or light-headedness   Complete by:  As directed    Call MD for:  persistant nausea and vomiting   Complete by:  As directed    Call MD for:  severe uncontrolled pain   Complete by:  As directed    Call MD for:  temperature >100.4   Complete by:  As directed    Diet - low sodium heart healthy   Complete by:  As directed    Discharge instructions   Complete by:  As directed    You were cared for by a hospitalist during your hospital stay. If you have any questions about your discharge medications or the care you received while you were in the hospital after you are discharged, you can call the unit and ask to speak with the hospitalist on call if the hospitalist that took care of you is not available. Once you are discharged, your primary care physician will handle any further medical issues. Please note that NO REFILLS for any discharge medications will be authorized once you are discharged, as it is imperative that you return to your primary care physician (or establish a relationship with a primary care physician if you do not have  one) for your aftercare needs so that they can reassess your need for medications and monitor your lab values.   Discharge instructions   Complete by:  As directed    YOU MUST FOLLOW UP WITH DR. FENG IN 1-2 DAYS.   Increase activity slowly   Complete by:  As directed      Allergies as of 12/26/2018   No Known Allergies     Medication List    STOP taking these medications   capecitabine 500 MG tablet Commonly known as:  XELODA   nitrofurantoin  (macrocrystal-monohydrate) 100 MG capsule Commonly known as:  MACROBID     TAKE these medications   finasteride 5 MG tablet Commonly known as:  PROSCAR Take 5 mg by mouth daily.   lovastatin 40 MG tablet Commonly known as:  MEVACOR Take 40 mg by mouth daily.   mesalamine 4 g enema Commonly known as:  ROWASA Place 4 g rectally as directed.       No Known Allergies  Consultations:  GI  General Surgery    Procedures/Studies: Ct Abdomen Pelvis W Contrast  Result Date: 12/25/2018 CLINICAL DATA:  Diarrhea EXAM: CT ABDOMEN AND PELVIS WITH CONTRAST TECHNIQUE: Multidetector CT imaging of the abdomen and pelvis was performed using the standard protocol following bolus administration of intravenous contrast. CONTRAST:  157mL OMNIPAQUE IOHEXOL 300 MG/ML  SOLN COMPARISON:  CT abdomen pelvis 10/28/2017 FINDINGS: LOWER CHEST: There is a posterior right lung base mass measuring 5.4 x 3.8 cm, previously 1.8 x 2.0 cm. There are multiple right lower lobe nodules, measuring up to 2.5 cm. Two of the nodules show cavitation. These findings have worsened since 10/28/2017. HEPATOBILIARY: There is a hypoattenuating lesion in the right hepatic lobe that measures 1.4 cm and is new compared to the prior studies. Remainder of the liver is normal. The gallbladder is normal. PANCREAS: The pancreatic parenchymal contours are normal and there is no ductal dilatation. There is no peripancreatic fluid collection. SPLEEN: Normal. ADRENALS/URINARY TRACT: --Adrenal glands: Normal. --Right kidney/ureter: There are multiple renal cysts, measuring up to 7 cm. The distribution of lesions is unchanged. --Left kidney/ureter: There are multiple renal cysts, the largest of which measures 2.4 cm, unchanged. --Urinary bladder: Multiple large calculi within the urinary bladder. STOMACH/BOWEL: --Stomach/Duodenum: Intermediate sized hiatal hernia. --Small bowel: No dilatation or inflammation. --Colon: There is wall thickening and  enhancement of the rectum and distal sigmoid colon. --Appendix: Normal. VASCULAR/LYMPHATIC: There is aortic atherosclerosis without hemodynamically significant stenosis. The portal vein, splenic vein, superior mesenteric vein and IVC are patent. 5 mm right pelvic sidewall node has decreased in size since the prior study. REPRODUCTIVE: Enlarged prostate measures 5.4 x 4.7 cm. MUSCULOSKELETAL. Multilevel degenerative disc disease and facet arthrosis. No bony spinal canal stenosis. OTHER: None. IMPRESSION: 1. Wall thickening and enhancement of the rectum and distal sigmoid colon, consistent with known rectal carcinoma. 2. New hypoattenuating lesion in the right hepatic lobe, concerning for metastatic disease. 3. Increased size of posterior right lung base mass, now measuring 5.4 x 3.8 cm, previously 1.8 x 2.0 cm. Multiple right lower lobe pulmonary nodules, worsened since 10/28/2017, and consistent with metastatic disease. 4. Multiple large calculi within the urinary bladder. Electronically Signed   By: Ulyses Jarred M.D.   On: 12/25/2018 04:35   Flexible Sigmoidoscopy 1/18 Findings:      A non-obstructing large mass was found in the proximal rectum, in the       mid rectum and in the recto-sigmoid colon. Ot extended up to  20cm. This       was biopsied with a cold forceps for histology. This is consistent with       his known rectal cancer. Impression:               - Malignant tumor in the proximal rectum, in the                            mid rectum and in the recto-sigmoid colon. Biopsied.  Discharge Exam: Vitals:   12/25/18 2112 12/26/18 0627  BP: (!) 148/73 (!) 150/80  Pulse: 69 71  Resp:    Temp: 98.2 F (36.8 C) 97.7 F (36.5 C)  SpO2: 98% 97%    General: Pt is alert, awake, not in acute distress Cardiovascular: RRR, S1/S2 +, no rubs, no gallops Respiratory: CTA bilaterally, no wheezing, no rhonchi Abdominal: Soft, NT, ND, bowel sounds + Extremities: no edema, no cyanosis    The  results of significant diagnostics from this hospitalization (including imaging, microbiology, ancillary and laboratory) are listed below for reference.     Microbiology: No results found for this or any previous visit (from the past 240 hour(s)).   Labs: BNP (last 3 results) No results for input(s): BNP in the last 8760 hours. Basic Metabolic Panel: Recent Labs  Lab 12/24/18 2132 12/26/18 0509  NA 142 141  K 3.7 4.3  CL 110 108  CO2 21* 23  GLUCOSE 88 88  BUN 18 20  CREATININE 1.08 1.00  CALCIUM 8.8* 8.4*   Liver Function Tests: Recent Labs  Lab 12/24/18 2132  AST 18  ALT 15  ALKPHOS 47  BILITOT 0.4  PROT 6.5  ALBUMIN 3.4*   Recent Labs  Lab 12/24/18 2132  LIPASE 23   No results for input(s): AMMONIA in the last 168 hours. CBC: Recent Labs  Lab 12/24/18 2132 12/25/18 1636 12/26/18 0509  WBC 11.0*  --   --   HGB 11.4* 10.7* 10.7*  HCT 38.2* 33.1* 34.0*  MCV 85.5  --   --   PLT 251  --   --    Cardiac Enzymes: No results for input(s): CKTOTAL, CKMB, CKMBINDEX, TROPONINI in the last 168 hours. BNP: Invalid input(s): POCBNP CBG: No results for input(s): GLUCAP in the last 168 hours. D-Dimer No results for input(s): DDIMER in the last 72 hours. Hgb A1c No results for input(s): HGBA1C in the last 72 hours. Lipid Profile No results for input(s): CHOL, HDL, LDLCALC, TRIG, CHOLHDL, LDLDIRECT in the last 72 hours. Thyroid function studies No results for input(s): TSH, T4TOTAL, T3FREE, THYROIDAB in the last 72 hours.  Invalid input(s): FREET3 Anemia work up No results for input(s): VITAMINB12, FOLATE, FERRITIN, TIBC, IRON, RETICCTPCT in the last 72 hours. Urinalysis    Component Value Date/Time   COLORURINE YELLOW 12/24/2018 2128   APPEARANCEUR HAZY (A) 12/24/2018 2128   LABSPEC 1.024 12/24/2018 2128   LABSPEC 1.025 11/27/2017 1208   PHURINE 5.0 12/24/2018 2128   GLUCOSEU NEGATIVE 12/24/2018 2128   GLUCOSEU Negative 11/27/2017 1208   HGBUR LARGE  (A) 12/24/2018 2128   BILIRUBINUR NEGATIVE 12/24/2018 2128   BILIRUBINUR Negative 11/27/2017 1208   KETONESUR 5 (A) 12/24/2018 2128   PROTEINUR NEGATIVE 12/24/2018 2128   UROBILINOGEN 0.2 11/27/2017 1208   NITRITE NEGATIVE 12/24/2018 2128   LEUKOCYTESUR NEGATIVE 12/24/2018 2128   LEUKOCYTESUR Moderate 11/27/2017 1208   Sepsis Labs Invalid input(s): PROCALCITONIN,  WBC,  LACTICIDVEN Microbiology No results found  for this or any previous visit (from the past 240 hour(s)).   Patient was seen and examined on the day of discharge and was found to be in stable condition. Time coordinating discharge: 35 minutes including assessment and coordination of care, as well as examination of the patient.   SIGNED:  Dessa Phi, DO [facility] [email or web address] 12/26/2018, 9:46 AM

## 2018-12-26 NOTE — Progress Notes (Addendum)
Discharge instructions given. Pt verbalized understand and all questions were answered. RN informed pt and family members at beside to schedule a follow up appointment with Dr Burr Medico in 1-2 days.     Pt's family members were upset that the palliative meeting at 2pm was canceled. Family members said that they were not informed about the cancellations.

## 2018-12-27 ENCOUNTER — Encounter (HOSPITAL_COMMUNITY): Payer: Self-pay | Admitting: Gastroenterology

## 2018-12-27 ENCOUNTER — Telehealth: Payer: Self-pay | Admitting: Hematology

## 2018-12-27 NOTE — Telephone Encounter (Signed)
Called pt and pt is aware of appt date and time per 1/19 sch message -

## 2018-12-28 ENCOUNTER — Inpatient Hospital Stay: Payer: Medicare Other | Attending: Hematology | Admitting: Hematology

## 2018-12-28 ENCOUNTER — Telehealth: Payer: Self-pay | Admitting: Hematology

## 2018-12-28 ENCOUNTER — Encounter: Payer: Self-pay | Admitting: Hematology

## 2018-12-28 VITALS — BP 124/80 | HR 94 | Temp 97.5°F | Resp 17 | Ht 70.0 in | Wt 156.1 lb

## 2018-12-28 DIAGNOSIS — D500 Iron deficiency anemia secondary to blood loss (chronic): Secondary | ICD-10-CM

## 2018-12-28 DIAGNOSIS — C787 Secondary malignant neoplasm of liver and intrahepatic bile duct: Secondary | ICD-10-CM | POA: Diagnosis not present

## 2018-12-28 DIAGNOSIS — Z9221 Personal history of antineoplastic chemotherapy: Secondary | ICD-10-CM | POA: Diagnosis not present

## 2018-12-28 DIAGNOSIS — C20 Malignant neoplasm of rectum: Secondary | ICD-10-CM

## 2018-12-28 DIAGNOSIS — R918 Other nonspecific abnormal finding of lung field: Secondary | ICD-10-CM

## 2018-12-28 DIAGNOSIS — D649 Anemia, unspecified: Secondary | ICD-10-CM

## 2018-12-28 DIAGNOSIS — Z923 Personal history of irradiation: Secondary | ICD-10-CM

## 2018-12-28 DIAGNOSIS — Z79899 Other long term (current) drug therapy: Secondary | ICD-10-CM

## 2018-12-28 DIAGNOSIS — N210 Calculus in bladder: Secondary | ICD-10-CM

## 2018-12-28 MED ORDER — DIPHENOXYLATE-ATROPINE 2.5-0.025 MG PO TABS: 1.0000 | ORAL_TABLET | Freq: Four times a day (QID) | ORAL | 1 refills | Status: DC | PRN

## 2018-12-28 NOTE — Telephone Encounter (Signed)
Scheduled appt per 01/21 los. ° °Printed calendar and avs. °

## 2018-12-28 NOTE — Progress Notes (Signed)
[place]   [phone] [phone]   Clinic Follow up Note   Patient Care Team: Wenda Low, MD as PCP - General (Internal Medicine) 12/28/2018  CHIEF COMPLAINT: F/u on metastatic rectal cancer   SUMMARY OF ONCOLOGIC HISTORY: Oncology History   Cancer Staging Rectal cancer [facility]) Staging form: Colon and Rectum, AJCC 8th Edition - Clinical stage from 09/29/2017: Stage IVA (cTX, cNX, cM1a) - Signed by Truitt Merle, MD on 03/21/2018      Rectal cancer ([place])   09/29/2017 Procedure    Colonoscopy by Dr. Penelope Coop 09/29/17  Findings:  The perianal and digital rectal examination were normal.  A non-obstructing large mass was found in the proximal rectum and in the mid rectum.  The mass was circumferential.  This was biopsied with a cold forceps for histology.  Area was tattooed with an injection of [place] ink both distal and proximal to the mass.  There is about 4-5 cm of rectal mucosa distal to the mass that looks norma. This mass is about 7-8 cm in length.  A 10 mm polyp was found in the ascending colon. The polyp was sessile. the polyp was removed with a hot snare.  Resection and retrieval were complete.      09/29/2017 Initial Biopsy    FINAL DIAGNOSIS 09/29/17  1. LG Intestine - Ascending Colon Polyp:  TUBULAR ADENOMA 2. LG Intestine - Rectum, Bx: INVASIVE MODERATELY DIFFERENTIATED ADENOCARCINOMA. THE MSI TESTING IS PENDING AND THE RESULT WILL BE REPORTED AS AN ADDENDUM.    09/29/2017 Cancer Staging    Staging form: Colon and Rectum, AJCC 8th Edition - Clinical stage from 09/29/2017: Stage IVA (cTX, cNX, cM1a) - Signed by Truitt Merle, MD on 03/21/2018    09/29/2017 Miscellaneous    Initial Biopsy MMR Results: MSI Stable      10/21/2017 Initial Diagnosis    Rectal cancer ([place])    10/28/2017 Imaging    CT CAP W Contrast  IMPRESSION: 1. Long segment circumferential wall thickening of the rectosigmoid colon consistent with known  adenocarcinoma. No evidence of bowel obstruction or perforation. 2. No definite signs of metastatic disease in the abdomen or pelvis. Soft tissue nodularity in the right pelvis is probably due to an unopacified pelvic vein. 3. Multiple pulmonary nodules, some cavitary. These are not typical of metastatic adenocarcinoma (especially without evidence of abdominal disease). Consider multicentric or metastatic lung cancer. PET-CT may be helpful to assess hypermetabolic activity and guided tissue sampling. 4. Multiple bladder calculi consistent with chronic bladder outlet obstruction from prostatomegaly. Nonobstructing right renal calculus. No hydronephrosis. 5. Aortic Atherosclerosis (ICD10-I70.0). Additional incidental findings including a moderate size hiatal hernia and renal cysts.     11/05/2017 - 12/18/2017 Radiation Therapy    Radiation with Dr. Lisbeth Renshaw With Xeloda    11/09/2017 - 12/18/2017 Chemotherapy    Chemoradiation with Xeloda 830m/m2 q12h, M-F on days of radiation plan to starting 11/09/17    11/11/2017 Imaging    PET Scan  IMPRESSION:  Wall thickening/ mass involving the upper rectum, corresponding to the patient's known rectal adenocarcinoma. Bilateral pulmonary nodules, partially cavitary in the left upper lobe nodule, suspicious for metastases. Notably, cavitary metastases are more common with squamous cell carcinoma rather than the patient's known adenocarcinoma.     12/25/2018 Procedure    12/25/2018 Sigmoidoscopy Impression: - Malignant tumor in the proximal rectum, in the mid rectum and in the recto-sigmoid colon. Biopsied.      12/25/2018 Imaging    12/25/2018 CT AP IMPRESSION:  1. Wall thickening and enhancement of the rectum and distal sigmoid colon, consistent with known rectal carcinoma. 2. New hypoattenuating lesion in the right hepatic lobe, concerning for metastatic disease. 3. Increased size of posterior right lung base mass, now measuring 5.4 x 3.8 cm,  previously 1.8 x 2.0 cm. Multiple right lower lobe pulmonary nodules, worsened since 10/28/2017, and consistent with metastatic disease. 4. Multiple large calculi within the urinary bladder.     CURRENT THERAPY: pending chemo    INTERVAL HISTORY: Adrian Banks is a 85 y.o. male who is here for follow-up. Since our last visit, he was admitted for worsening diarrhea on 12/25/2018. He had a sigmoidoscopy done at that time, and it revealed known rectal cancer and a new hypoattenuating lesion in the right hepatic lobe, concerning for metastatic disease. Today, he is here with his family members. He noted dark stools and noted blood in stool after the sigmoidoscopy. He has diarrhea a day. He used imodium for his diarrhea, but it provided minimal relief. His diarrhea started worsening after radiation. He denies stool incontinence, chest pain or congestion. He says that his sleep is disturbed due to diarrhea.   Pertinent positives and negatives of review of systems are listed and detailed within the above HPI.  REVIEW OF SYSTEMS:   Constitutional: Denies fevers, chills or abnormal weight loss (+) sleep disturbance due to diarrhea Eyes: Denies blurriness of vision Ears, nose, mouth, throat, and face: Denies mucositis or sore throat Respiratory: Denies cough, dyspnea or wheezes  Cardiovascular: Denies palpitation, chest discomfort or lower extremity swelling Gastrointestinal:  Denies nausea, heartburn (+) diarrhea (+) dark stools Skin: Denies abnormal skin rashes Lymphatics: Denies new lymphadenopathy or easy bruising Neurological:Denies numbness, tingling or new weaknesses Behavioral/Psych: Mood is stable, no new changes  All other systems were reviewed with the patient and are negative.  MEDICAL HISTORY:  Past Medical History:  Diagnosis Date  . Cancer ([place]) 09/29/2017   Rectal cvancer    SURGICAL HISTORY: Past Surgical History:  Procedure Laterality Date  . BIOPSY  12/25/2018    Procedure: BIOPSY;  Surgeon: Wonda Horner, MD;  Location: [facility] ENDOSCOPY;  Service: Endoscopy;;  . FLEXIBLE SIGMOIDOSCOPY N/A 12/25/2018   Procedure: Beryle Quant;  Surgeon: Wonda Horner, MD;  Location: [facility] ENDOSCOPY;  Service: Endoscopy;  Laterality: N/A;    I have reviewed the social history and family history with the patient and they are unchanged from previous note.  ALLERGIES:  has No Known Allergies.  MEDICATIONS:  Current Outpatient Medications  Medication Sig Dispense Refill  . finasteride (PROSCAR) 5 MG tablet Take 5 mg by mouth daily.     Marland Kitchen lovastatin (MEVACOR) 40 MG tablet Take 40 mg by mouth daily.     . mesalamine (ROWASA) 4 g enema Place 4 g rectally as directed.     . diphenoxylate-atropine (LOMOTIL) 2.5-0.025 MG tablet Take 1-2 tablets by mouth 4 (four) times daily as needed for diarrhea or loose stools. 30 tablet 1   No current facility-administered medications for this visit.     PHYSICAL EXAMINATION: ECOG PERFORMANCE STATUS: 1 - Symptomatic but completely ambulatory  Vitals:   12/28/18 1322  BP: 124/80  Pulse: 94  Resp: 17  Temp: (!) 97.5 F (36.4 C)  SpO2: 100%   Filed Weights   12/28/18 1322  Weight: 156 lb 1.6 oz (70.8 kg)    GENERAL:alert, no distress and comfortable SKIN: skin color, texture, turgor are normal, no rashes or significant lesions EYES: normal, Conjunctiva are  pink and non-injected, sclera clear OROPHARYNX:no exudate, no erythema and lips, buccal mucosa, and tongue normal  NECK: supple, thyroid normal size, non-tender, without nodularity LYMPH:  no palpable lymphadenopathy in the cervical, axillary or inguinal LUNGS: clear to auscultation and percussion with normal breathing effort HEART: regular rate & rhythm and no murmurs and no lower extremity edema ABDOMEN:abdomen soft, non-tender and normal bowel sounds. No organomegaly  Musculoskeletal:no cyanosis of digits and no clubbing  NEURO: alert & oriented x 3 with fluent  speech, no focal motor/sensory deficits  LABORATORY DATA:  I have reviewed the data as listed CBC Latest Ref Rng & Units 12/26/2018 12/25/2018 12/24/2018  WBC 4.0 - 10.5 K/uL - - 11.0(H)  Hemoglobin 13.0 - 17.0 g/dL 10.7(L) 10.7(L) 11.4(L)  Hematocrit 39.0 - 52.0 % 34.0(L) 33.1(L) 38.2(L)  Platelets 150 - 400 K/uL - - 251     CMP Latest Ref Rng & Units 12/26/2018 12/24/2018 03/22/2018  Glucose 70 - 99 mg/dL 88 88 101  BUN 8 - 23 mg/dL 20 18 19   Creatinine 0.61 - 1.24 mg/dL 1.00 1.08 1.24  Sodium 135 - 145 mmol/L 141 142 142  Potassium 3.5 - 5.1 mmol/L 4.3 3.7 4.0  Chloride 98 - 111 mmol/L 108 110 109  CO2 22 - 32 mmol/L 23 21(L) 24  Calcium 8.9 - 10.3 mg/dL 8.4(L) 8.8(L) 9.3  Total Protein 6.5 - 8.1 g/dL - 6.5 6.9  Total Bilirubin 0.3 - 1.2 mg/dL - 0.4 0.6  Alkaline Phos 38 - 126 U/L - 47 71  AST 15 - 41 U/L - 18 13  ALT 0 - 44 U/L - 15 11   Tumor Markers CEA 12/07/2017 7.63   PROCEDURES  12/25/2018 Sigmoidoscopy Findings: A non-obstructing large mass was found in the proximal rectum, in the mid rectum and in the rectosigmoid colon. Ot extended up to 20cm. This was biopsied with a cold forceps for histology. This is consistent with his known rectal cancer. Impression: - Malignant tumor in the proximal rectum, in the mid rectum and in the recto-sigmoid colon. Biopsied.    RADIOGRAPHIC STUDIES: I have personally reviewed the radiological images as listed and agreed with the findings in the report. No results found.   12/25/2018 CT AP IMPRESSION: 1. Wall thickening and enhancement of the rectum and distal sigmoid colon, consistent with known rectal carcinoma. 2. New hypoattenuating lesion in the right hepatic lobe, concerning for metastatic disease. 3. Increased size of posterior right lung base mass, now measuring 5.4 x 3.8 cm, previously 1.8 x 2.0 cm. Multiple right lower lobe pulmonary nodules, worsened since 10/28/2017, and consistent with metastatic disease. 4.  Multiple large calculi within the urinary bladder.  ASSESSMENT & PLAN:  Adrian Banks is a 85 y.o. male with history of  1. Rectal Cancer, invasive adenocarcinoma, with lung and liver metastasis, cTxNxM1, stage IV  -Diagnosed in 09/2017. His initial staging scans showed bilateral lung nodules, highly suspicious for lung metastasis.  He declined biopsy of his lung lesions.  He was treated with radiation and chemo to his primary rectal cancer. I last saw him in 03/2018 and he lost f/u since then.  -I reviewed and discussed his recent CT and sigmoidoscopy results which were done during his recent hospitalization for worsening diarrhea. Unfortunately, CT showed new hypoattenuating lesion in the right hepatic lobe, concerning for metastatic disease, and worsening lung metastasis.  I personally reviewed his scan images with patient and his wife, and the daughter. -We discussed that this is likely metastatic rectal  cancer, also primary lung cancer with metastasis is also a possibility.  Unfortunately his disease not curable at this stage. -We discussed the treatment option for metastatic rectal cancer, mainly chemotherapy, and the role of immunotherapy in targeted therapy.  The goal of therapy is palliative to prolong his life, and improve his quality of life by controlling cancer related symptoms.  He understands the response to chemotherapy is not guaranteed, and a he will likely experience side effects from chemo.  He voiced good understanding, and agrees with chemo.  -I discussed biopsy of his liver or lung lesion to confirm metastatic disease, and port placement to start chemo. He agrees. Will arrange.  -I will prescribe lomotil for diarrhea. He doesn't sleep well due to diarrhea. I discussed using melatonin, but advised him to control his diarrhea first. -f/u in 2 weeks to discuss biopsy results and finalize chemo. -Will order molecular testing [facility] on biopsy sample to see if he is a candidate  for immunotherapy. -Will repeat CT chest to complete staging before next visit   2. Lung masses and new liver masses  -Likely metastatic. Patient previously declined biopsy. -Unfortunately, CT  From 12/25/2018 showed new hypoattenuating lesion in the right hepatic lobe, concerning for metastatic disease. -I discussed biopsy of either lung or liver. He agrees. I will discuss with IR and refer him.   3. Anemia  -new since 9 month ago -probably related to his rectal cancer bleeding -will check iron study   4. Goal of care discussion  -We previously discussed the incurable nature of his cancer, and the overall poor prognosis, especially if he does not have good response to radiation, chemotherapy or progress on chemo -The patient understands the goal of care is palliative. -I previously recommended DNR/DNI, he will think about it    Plan  -refer to IR for liver or lung mass biopsy and port placement  -Repeat CT chest in 1-2 weeks -f/u in 2 weeks to reviewed biopsy results and discuss chemo -Will order FO on his biopsy to see if he is a candidate for immunotherapy or targeted therapy  -I prescribed lomotil for his diarrhea   No problem-specific Assessment & Plan notes found for this encounter.   Orders Placed This Encounter  Procedures  . CT Chest Wo Contrast    Standing Status:   Future    Standing Expiration Date:   12/28/2019    Order Specific Question:   Preferred imaging location?    Answer:   [facility]    Order Specific Question:   Radiology Contrast Protocol - do NOT remove file path    Answer:   \\[email or web address].pdf  . CT Biopsy    I discussed with Dr. Barbie Banner and he approved the biopsy, please schedule asap    Standing Status:   Future    Standing Expiration Date:   12/28/2019    Order Specific Question:   Lab orders requested (DO NOT place separate lab orders, these will be automatically ordered during procedure specimen collection):     Answer:   Surgical Pathology    Order Specific Question:   Reason for Exam (SYMPTOM  OR DIAGNOSIS REQUIRED)    Answer:   RLL lung mass or liver biopsy    Order Specific Question:   Preferred imaging location?    Answer:   [facility]    Order Specific Question:   Radiology Contrast Protocol - do NOT remove file path    Answer:   \\[email or web address]  .  IR Perc Tun Perit Cath W/Port    Please schedule the same day as biopsy, thanks    Standing Status:   Future    Standing Expiration Date:   02/26/2020    Order Specific Question:   Reason for exam:    Answer:   port for chemo    Order Specific Question:   Preferred Imaging Location?    Answer:   [facility]   All questions were answered. The patient knows to call the clinic with any problems, questions or concerns. No barriers to learning was detected. I spent 30 minutes counseling the patient face to face. The total time spent in the appointment was 40 minutes and more than 50% was on counseling and review of test results  I, Noor Dweik am acting as scribe for Dr. Truitt Merle.  I have reviewed the above documentation for accuracy and completeness, and I agree with the above.     Truitt Merle, MD 12/28/2018

## 2018-12-29 DIAGNOSIS — R197 Diarrhea, unspecified: Secondary | ICD-10-CM

## 2018-12-30 ENCOUNTER — Other Ambulatory Visit: Payer: Self-pay | Admitting: Hematology

## 2018-12-30 DIAGNOSIS — C20 Malignant neoplasm of rectum: Secondary | ICD-10-CM

## 2019-01-03 ENCOUNTER — Telehealth: Payer: Self-pay | Admitting: Hematology

## 2019-01-03 NOTE — Telephone Encounter (Signed)
R/s appt per 1/24 sch message - pt is aware of appt date and time  

## 2019-01-05 ENCOUNTER — Other Ambulatory Visit: Payer: Self-pay | Admitting: Hematology

## 2019-01-05 ENCOUNTER — Other Ambulatory Visit: Payer: Self-pay

## 2019-01-05 ENCOUNTER — Telehealth: Payer: Self-pay

## 2019-01-05 ENCOUNTER — Emergency Department (HOSPITAL_COMMUNITY)
Admission: EM | Admit: 2019-01-05 | Discharge: 2019-01-05 | Disposition: A | Discharge status: Home / Self Care | Payer: Medicare Other | Attending: Emergency Medicine | Admitting: Emergency Medicine

## 2019-01-05 ENCOUNTER — Encounter (HOSPITAL_COMMUNITY): Payer: Self-pay

## 2019-01-05 DIAGNOSIS — Z79899 Other long term (current) drug therapy: Secondary | ICD-10-CM | POA: Insufficient documentation

## 2019-01-05 DIAGNOSIS — C20 Malignant neoplasm of rectum: Secondary | ICD-10-CM

## 2019-01-05 DIAGNOSIS — C7800 Secondary malignant neoplasm of unspecified lung: Secondary | ICD-10-CM | POA: Insufficient documentation

## 2019-01-05 DIAGNOSIS — D500 Iron deficiency anemia secondary to blood loss (chronic): Secondary | ICD-10-CM

## 2019-01-05 DIAGNOSIS — I951 Orthostatic hypotension: Secondary | ICD-10-CM

## 2019-01-05 DIAGNOSIS — Z87891 Personal history of nicotine dependence: Secondary | ICD-10-CM | POA: Diagnosis not present

## 2019-01-05 DIAGNOSIS — K625 Hemorrhage of anus and rectum: Secondary | ICD-10-CM | POA: Insufficient documentation

## 2019-01-05 DIAGNOSIS — C787 Secondary malignant neoplasm of liver and intrahepatic bile duct: Secondary | ICD-10-CM | POA: Insufficient documentation

## 2019-01-05 LAB — CBC
HCT: 32.7 % — ABNORMAL LOW (ref 39.0–52.0)
Hemoglobin: 9.8 g/dL — ABNORMAL LOW (ref 13.0–17.0)
MCH: 25.7 pg — ABNORMAL LOW (ref 26.0–34.0)
MCHC: 30 g/dL (ref 30.0–36.0)
MCV: 85.8 fL (ref 80.0–100.0)
Platelets: 277 10*3/uL (ref 150–400)
RBC: 3.81 MIL/uL — ABNORMAL LOW (ref 4.22–5.81)
RDW: 15.9 % — AB (ref 11.5–15.5)
WBC: 11.1 10*3/uL — AB (ref 4.0–10.5)
nRBC: 0 % (ref 0.0–0.2)

## 2019-01-05 LAB — HEMOGLOBIN AND HEMATOCRIT, BLOOD
HCT: 31.5 % — ABNORMAL LOW (ref 39.0–52.0)
Hemoglobin: 9.7 g/dL — ABNORMAL LOW (ref 13.0–17.0)

## 2019-01-05 LAB — TYPE AND SCREEN
ABO/RH(D): A POS
Antibody Screen: NEGATIVE

## 2019-01-05 LAB — COMPREHENSIVE METABOLIC PANEL
ALT: 15 U/L (ref 0–44)
AST: 16 U/L (ref 15–41)
Albumin: 3.3 g/dL — ABNORMAL LOW (ref 3.5–5.0)
Alkaline Phosphatase: 51 U/L (ref 38–126)
Anion gap: 8 (ref 5–15)
BUN: 20 mg/dL (ref 8–23)
CO2: 23 mmol/L (ref 22–32)
CREATININE: 1.14 mg/dL (ref 0.61–1.24)
Calcium: 8.2 mg/dL — ABNORMAL LOW (ref 8.9–10.3)
Chloride: 108 mmol/L (ref 98–111)
GFR calc Af Amer: >60 mL/min (ref >60)
GFR, EST NON AFRICAN AMERICAN: 59 mL/min — AB (ref >60)
Glucose, Bld: 135 mg/dL — ABNORMAL HIGH (ref 70–99)
Potassium: 3.6 mmol/L (ref 3.5–5.1)
Sodium: 139 mmol/L (ref 135–145)
Total Bilirubin: 0.9 mg/dL (ref 0.3–1.2)
Total Protein: 6.3 g/dL — ABNORMAL LOW (ref 6.5–8.1)

## 2019-01-05 LAB — POC OCCULT BLOOD, ED: Fecal Occult Bld: POSITIVE — AB

## 2019-01-05 NOTE — Discharge Instructions (Addendum)
It is very important that you stay well-hydrated with water. Continue taking all your home medications including your medicine for diarrhea. Dr. Fritz Pickerel office should call you to schedule recheck of your hemoglobin, it is important that this gets rechecked in the next several days. Return to the emergency room if you develop increased weakness, dizziness, persistent bleeding, or any new, worsening, concerning symptoms.

## 2019-01-05 NOTE — ED Notes (Signed)
Bed: TU42 Expected date:  Expected time:  Means of arrival:  Comments: EMS-86yo rectal bleeding

## 2019-01-05 NOTE — ED Provider Notes (Signed)
[place] DEPT Provider Note   CSN: [id number] Arrival date & time: 01/05/19  1225     History   Chief Complaint Chief Complaint  Patient presents with  . Rectal Bleeding    HPI Adrian Banks is a 85 y.o. male presenting for evaluation of rectal bleeding and weakness.  Patient states at 8:00 this morning he had a watery/diarrheal bowel movement with gross blood.  He states he had blood pouring from his rectum.  Afterwards, he felt weak and lightheaded. Per triage note, pt was hypotensive and orthostatic. Given 500 cc fluids with EMS, which resolved pt's sxs.  Currently, patient reports no dizziness, weakness, or lightheadedness.  He reports no fevers, chills, chest pain, shortness breath, nausea, vomiting, abdominal pain, or abnormal urination.  He is not on blood thinners.  Additional history obtained from chart review.  Patient with a history of rectal cancer with mets to the liver and lungs.  Plan for palliative chemotherapy starting in the next few weeks.  He has had increasing anemia over the past several months, thought to be related to rectal cancer.  HPI  Past Medical History:  Diagnosis Date  . Cancer ([place]) 09/29/2017   Rectal cvancer    Patient Active Problem List   Diagnosis Date Noted  . Diarrhea 12/29/2018  . GIB (gastrointestinal bleeding) 12/25/2018  . Goals of care, counseling/discussion 11/02/2017  . Iron deficiency anemia due to chronic blood loss 10/23/2017  . Rectal cancer ([place]) 10/21/2017    Past Surgical History:  Procedure Laterality Date  . BIOPSY  12/25/2018   Procedure: BIOPSY;  Surgeon: Wonda Horner, MD;  Location: [facility] ENDOSCOPY;  Service: Endoscopy;;  . FLEXIBLE SIGMOIDOSCOPY N/A 12/25/2018   Procedure: Beryle Quant;  Surgeon: Wonda Horner, MD;  Location: [facility] ENDOSCOPY;  Service: Endoscopy;  Laterality: N/A;        Home Medications    Prior to Admission medications   Medication Sig Start Date  End Date Taking? Authorizing Provider  diphenoxylate-atropine (LOMOTIL) 2.5-0.025 MG tablet Take 1-2 tablets by mouth 4 (four) times daily as needed for diarrhea or loose stools. 12/28/18  Yes Truitt Merle, MD  finasteride (PROSCAR) 5 MG tablet Take 5 mg by mouth daily.    Yes [provider]  lovastatin (MEVACOR) 40 MG tablet Take 40 mg by mouth daily.    Yes [provider]  naproxen sodium (ALEVE) 220 MG tablet Take 220 mg by mouth daily as needed (pain).   Yes [provider]    Family History Family History  Problem Relation Age of Onset  . Cancer Mother        cancer (unknown type)    Social History Social History   Tobacco Use  . Smoking status: Former Smoker    Packs/day: 0.50    Years: 10.00    Pack years: 5.00    Last attempt to quit: 1988    Years since quitting: 32.0  . Smokeless tobacco: Never Used  Substance Use Topics  . Alcohol use: Yes    Comment: ocassionally   . Drug use: No     Allergies   Patient has no known allergies.   Review of Systems Review of Systems  Neurological: Positive for dizziness (resolved) and weakness (resolved).  All other systems reviewed and are negative.    Physical Exam Updated Vital Signs BP (!) 145/78   Pulse 63   Temp (!) 97.4 F (36.3 C) (Oral)   Resp 13   Wt  72.6 kg   SpO2 100%   BMI 22.96 kg/m   Physical Exam Vitals signs and nursing note reviewed.  Constitutional:      Appearance: He is well-developed.     Comments: Elderly, chronically ill-appearing male in no acute distress  HENT:     Head: Normocephalic and atraumatic.     Mouth/Throat:     Mouth: Mucous membranes are dry.  Eyes:     Conjunctiva/sclera: Conjunctivae normal.     Pupils: Pupils are equal, round, and reactive to light.  Neck:     Musculoskeletal: Normal range of motion and neck supple.  Cardiovascular:     Rate and Rhythm: Normal rate and regular rhythm.     Pulses: Normal pulses.  Pulmonary:     Effort:  Pulmonary effort is normal. No respiratory distress.     Breath sounds: Normal breath sounds. No wheezing.  Abdominal:     General: There is no distension.     Palpations: Abdomen is soft. There is no mass.     Tenderness: There is no abdominal tenderness. There is no guarding or rebound.  Genitourinary:    Rectum: Guaiac result positive.  Musculoskeletal: Normal range of motion.  Skin:    General: Skin is warm and dry.     Capillary Refill: Capillary refill takes less than 2 seconds.  Neurological:     Mental Status: He is alert and oriented to person, place, and time.      ED Treatments / Results  Labs (all labs ordered are listed, but only abnormal results are displayed) Labs Reviewed  COMPREHENSIVE METABOLIC PANEL - Abnormal; Notable for the following components:      Result Value   Glucose, Bld 135 (*)    Calcium 8.2 (*)    Total Protein 6.3 (*)    Albumin 3.3 (*)    GFR calc non Af Amer 59 (*)    All other components within normal limits  CBC - Abnormal; Notable for the following components:   WBC 11.1 (*)    RBC 3.81 (*)    Hemoglobin 9.8 (*)    HCT 32.7 (*)    MCH 25.7 (*)    RDW 15.9 (*)    All other components within normal limits  HEMOGLOBIN AND HEMATOCRIT, BLOOD - Abnormal; Notable for the following components:   Hemoglobin 9.7 (*)    HCT 31.5 (*)    All other components within normal limits  POC OCCULT BLOOD, ED - Abnormal; Notable for the following components:   Fecal Occult Bld POSITIVE (*)    All other components within normal limits  TYPE AND SCREEN    EKG None  Radiology No results found.  Procedures Procedures (including critical care time)  Medications Ordered in ED Medications - No data to display   Initial Impression / Assessment and Plan / ED Course  I have reviewed the triage vital signs and the nursing notes.  Pertinent labs & imaging results that were available during my care of the patient were reviewed by me and considered in  my medical decision making (see chart for details).     Patient presenting for evaluation of rectal bleeding, dizziness, and lightheadedness.  Physical exam reassuring, patient's blood pressure and heart rate have stabilized in the ER.  He is no longer orthostatic, no longer symptomatic.  Consider symptomatic anemia, orthostatic hypotension, and vagal.  Will obtain labs, type and screen, and reassess.  Hemoccult grossly positive.  Case discussed with attending, Dr. Alvino Chapel evaluated  the patient.  Labs show mild anemia at 9.8, less than 1 g different from 10 days ago.  Otherwise, labs are at baseline.  Patient remains asymptomatic.  Will consult with patient's oncologist for further management.  Discussed with Dr. Burr Medico from oncology, who states that as patient appears stable, she does not see need for admission at this time if patient remains stable.  Will have her office call the patient for follow-up for hemoglobin recheck.  4 hours after initial hemoglobin, repeat remained stable at 9.7.  Discussed findings with patient and family.  Patient ambulatory to the bathroom without dizziness or weakness.  Discussed importance of follow-up for hemoglobin recheck, and continue management with oncology.  Strict return precautions given, including signs of continued bleeding, weakness, dizziness.  At this time, patient appears safe for discharge.  Patient and family state they understand and agree to plan.  Final Clinical Impressions(s) / ED Diagnoses   Final diagnoses:  Rectal bleeding  Rectal cancer ([place])  Orthostatic hypotension    ED Discharge Orders    None       Franchot Heidelberg, PA-C 01/05/19 1621    Davonna Belling, MD 01/06/19 [phone]

## 2019-01-05 NOTE — Telephone Encounter (Signed)
Patient's wife calls stating blood is pouring from his rectum, patient light headed feels like he is going to pass out, instructed her to call 911 to get ambulance to take him to the hospital right away.  She verbalized an understanding.

## 2019-01-05 NOTE — ED Triage Notes (Signed)
Pt arrives via [facility] from home. Pt has hx of rectal cancer. Pt was prescribed diphenoxylate-atropine for diarrhea. Pt reports that today he had episodes of bright red rectal bleeding. Per EMS: pt was orthostatic upon assessment. Sitting BP:90/68, Standing BP: 78/50.

## 2019-01-06 ENCOUNTER — Other Ambulatory Visit: Payer: Self-pay

## 2019-01-06 ENCOUNTER — Telehealth: Payer: Self-pay | Admitting: Hematology

## 2019-01-06 NOTE — Telephone Encounter (Signed)
Scheduled appt per 12/9 sch message pt is aware of appt date and time   

## 2019-01-07 ENCOUNTER — Inpatient Hospital Stay: Payer: Medicare Other

## 2019-01-07 DIAGNOSIS — C20 Malignant neoplasm of rectum: Secondary | ICD-10-CM

## 2019-01-07 DIAGNOSIS — D500 Iron deficiency anemia secondary to blood loss (chronic): Secondary | ICD-10-CM

## 2019-01-07 LAB — IRON AND TIBC
Iron: 22 ug/dL — ABNORMAL LOW (ref 42–163)
Saturation Ratios: 8 % — ABNORMAL LOW (ref 20–55)
TIBC: 283 ug/dL (ref 202–409)
UIBC: 260 ug/dL (ref 117–376)

## 2019-01-07 LAB — CBC WITH DIFFERENTIAL/PLATELET
Abs Immature Granulocytes: 0.06 10*3/uL (ref 0.00–0.07)
Basophils Absolute: 0.1 10*3/uL (ref 0.0–0.1)
Basophils Relative: 1 %
Eosinophils Absolute: 0.3 10*3/uL (ref 0.0–0.5)
Eosinophils Relative: 3 %
HCT: 27.7 % — ABNORMAL LOW (ref 39.0–52.0)
Hemoglobin: 8.6 g/dL — ABNORMAL LOW (ref 13.0–17.0)
Immature Granulocytes: 1 %
Lymphocytes Relative: 8 %
Lymphs Abs: 0.7 10*3/uL (ref 0.7–4.0)
MCH: 25.7 pg — AB (ref 26.0–34.0)
MCHC: 31 g/dL (ref 30.0–36.0)
MCV: 82.9 fL (ref 80.0–100.0)
MONO ABS: 0.9 10*3/uL (ref 0.1–1.0)
Monocytes Relative: 10 %
Neutro Abs: 7.2 10*3/uL (ref 1.7–7.7)
Neutrophils Relative %: 77 %
Platelets: 294 10*3/uL (ref 150–400)
RBC: 3.34 MIL/uL — AB (ref 4.22–5.81)
RDW: 15.7 % — ABNORMAL HIGH (ref 11.5–15.5)
WBC: 9.3 10*3/uL (ref 4.0–10.5)
nRBC: 0 % (ref 0.0–0.2)

## 2019-01-07 LAB — FERRITIN: Ferritin: 17 ng/mL — ABNORMAL LOW (ref 24–336)

## 2019-01-07 LAB — COMPREHENSIVE METABOLIC PANEL
ALT: 11 U/L (ref 0–44)
AST: 12 U/L — ABNORMAL LOW (ref 15–41)
Albumin: 3.2 g/dL — ABNORMAL LOW (ref 3.5–5.0)
Alkaline Phosphatase: 65 U/L (ref 38–126)
Anion gap: 9 (ref 5–15)
BUN: 16 mg/dL (ref 8–23)
CO2: 26 mmol/L (ref 22–32)
Calcium: 9.1 mg/dL (ref 8.9–10.3)
Chloride: 110 mmol/L (ref 98–111)
Creatinine, Ser: 1.08 mg/dL (ref 0.61–1.24)
GFR calc Af Amer: >60 mL/min (ref >60)
GFR calc non Af Amer: >60 mL/min (ref >60)
Glucose, Bld: 100 mg/dL — ABNORMAL HIGH (ref 70–99)
Potassium: 4 mmol/L (ref 3.5–5.1)
Sodium: 145 mmol/L (ref 135–145)
Total Bilirubin: 0.5 mg/dL (ref 0.3–1.2)
Total Protein: 6.4 g/dL — ABNORMAL LOW (ref 6.5–8.1)

## 2019-01-07 LAB — CEA (IN HOUSE-CHCC): CEA (CHCC-In House): 47 ng/mL — ABNORMAL HIGH (ref 0.00–5.00)

## 2019-01-07 LAB — SAMPLE TO BLOOD BANK

## 2019-01-08 ENCOUNTER — Other Ambulatory Visit: Payer: Self-pay | Admitting: Hematology

## 2019-01-10 ENCOUNTER — Other Ambulatory Visit: Payer: Self-pay | Admitting: Physician Assistant

## 2019-01-10 ENCOUNTER — Other Ambulatory Visit: Payer: Self-pay | Admitting: Radiology

## 2019-01-10 ENCOUNTER — Other Ambulatory Visit (HOSPITAL_COMMUNITY): Payer: Medicare Other

## 2019-01-11 ENCOUNTER — Other Ambulatory Visit: Payer: Self-pay | Admitting: Hematology

## 2019-01-11 ENCOUNTER — Other Ambulatory Visit: Payer: Medicare Other

## 2019-01-11 ENCOUNTER — Ambulatory Visit: Payer: Medicare Other | Admitting: Hematology

## 2019-01-11 ENCOUNTER — Ambulatory Visit (HOSPITAL_COMMUNITY)
Admission: RE | Admit: 2019-01-11 | Discharge: 2019-01-11 | Disposition: A | Discharge status: Home / Self Care | Payer: Medicare Other | Source: Ambulatory Visit | Attending: Hematology | Admitting: Hematology

## 2019-01-11 ENCOUNTER — Other Ambulatory Visit: Payer: Self-pay | Admitting: Radiology

## 2019-01-11 ENCOUNTER — Other Ambulatory Visit: Payer: Self-pay

## 2019-01-11 ENCOUNTER — Encounter (HOSPITAL_COMMUNITY): Payer: Self-pay

## 2019-01-11 DIAGNOSIS — Z809 Family history of malignant neoplasm, unspecified: Secondary | ICD-10-CM | POA: Diagnosis not present

## 2019-01-11 DIAGNOSIS — Z87891 Personal history of nicotine dependence: Secondary | ICD-10-CM | POA: Diagnosis not present

## 2019-01-11 DIAGNOSIS — E7800 Pure hypercholesterolemia, unspecified: Secondary | ICD-10-CM | POA: Diagnosis not present

## 2019-01-11 DIAGNOSIS — C20 Malignant neoplasm of rectum: Secondary | ICD-10-CM

## 2019-01-11 DIAGNOSIS — Z79899 Other long term (current) drug therapy: Secondary | ICD-10-CM | POA: Diagnosis not present

## 2019-01-11 HISTORY — DX: Pure hypercholesterolemia, unspecified: E78.00

## 2019-01-11 HISTORY — PX: IR IMAGING GUIDED PORT INSERTION: IMG5740

## 2019-01-11 LAB — CBC
HCT: 29 % — ABNORMAL LOW (ref 39.0–52.0)
Hemoglobin: 8.8 g/dL — ABNORMAL LOW (ref 13.0–17.0)
MCH: 25.7 pg — ABNORMAL LOW (ref 26.0–34.0)
MCHC: 30.3 g/dL (ref 30.0–36.0)
MCV: 84.5 fL (ref 80.0–100.0)
PLATELETS: 325 10*3/uL (ref 150–400)
RBC: 3.43 MIL/uL — ABNORMAL LOW (ref 4.22–5.81)
RDW: 15.7 % — ABNORMAL HIGH (ref 11.5–15.5)
WBC: 9.3 10*3/uL (ref 4.0–10.5)
nRBC: 0 % (ref 0.0–0.2)

## 2019-01-11 LAB — PROTIME-INR
INR: 1.17
PROTHROMBIN TIME: 14.8 s (ref 11.4–15.2)

## 2019-01-11 MED ORDER — CEFAZOLIN SODIUM-DEXTROSE 2-4 GM/100ML-% IV SOLN
INTRAVENOUS | Status: AC
  Administered 2019-01-11: 2 g via INTRAVENOUS
  Filled 2019-01-11: qty 100

## 2019-01-11 MED ORDER — CEFAZOLIN SODIUM-DEXTROSE 2-4 GM/100ML-% IV SOLN
2.0000 g | Freq: Once | INTRAVENOUS | Status: AC
  Administered 2019-01-11: 2 g via INTRAVENOUS

## 2019-01-11 MED ORDER — LIDOCAINE HCL 1 % IJ SOLN
INTRAMUSCULAR | Status: AC
  Filled 2019-01-11: qty 20

## 2019-01-11 MED ORDER — FENTANYL CITRATE (PF) 100 MCG/2ML IJ SOLN
INTRAMUSCULAR | Status: AC
  Filled 2019-01-11: qty 2

## 2019-01-11 MED ORDER — SODIUM CHLORIDE 0.9 % IV SOLN
INTRAVENOUS | Status: DC
  Administered 2019-01-11: 13:00:00 via INTRAVENOUS

## 2019-01-11 MED ORDER — FENTANYL CITRATE (PF) 100 MCG/2ML IJ SOLN
INTRAMUSCULAR | Status: AC | PRN
  Administered 2019-01-11 (×2): 25 ug via INTRAVENOUS

## 2019-01-11 MED ORDER — LIDOCAINE HCL (PF) 1 % IJ SOLN
INTRAMUSCULAR | Status: AC | PRN
  Administered 2019-01-11: 10 mL

## 2019-01-11 MED ORDER — HEPARIN SOD (PORK) LOCK FLUSH 100 UNIT/ML IV SOLN
INTRAVENOUS | Status: AC
  Filled 2019-01-11: qty 5

## 2019-01-11 MED ORDER — MIDAZOLAM HCL 2 MG/2ML IJ SOLN
INTRAMUSCULAR | Status: AC
  Filled 2019-01-11: qty 2

## 2019-01-11 MED ORDER — MIDAZOLAM HCL 2 MG/2ML IJ SOLN
INTRAMUSCULAR | Status: AC | PRN
  Administered 2019-01-11 (×3): 0.5 mg via INTRAVENOUS

## 2019-01-11 NOTE — Procedures (Signed)
RIJV PAC SVC RA EBL 0 Comp 0 

## 2019-01-11 NOTE — Consult Note (Signed)
Chief Complaint: Patient was seen in consultation today for Port-A-Cath placement  Referring Physician(s): Feng,Yan  Supervising Physician: Marybelle Killings  Patient Status: [place]  History of Present Illness: Adrian Banks is a 85 y.o. male with history of metastatic rectal carcinoma prior chemoradiation.  Recent follow-up imaging has revealed wall thickening and enhancement of the rectum and distal sigmoid consistent with known rectal cancer, new lesion in the right hepatic lobe concerning for metastatic disease, increased size of posterior right lung base mass and multiple right lower lobe pulmonary nodules.  He presents today for Port-A-Cath placement for palliative chemotherapy.  Past Medical History:  Diagnosis Date  . Cancer ([place]) 09/29/2017   Rectal cvancer  . High cholesterol     Past Surgical History:  Procedure Laterality Date  . BIOPSY  12/25/2018   Procedure: BIOPSY;  Surgeon: Wonda Horner, MD;  Location: [facility] ENDOSCOPY;  Service: Endoscopy;;  . FLEXIBLE SIGMOIDOSCOPY N/A 12/25/2018   Procedure: Beryle Quant;  Surgeon: Wonda Horner, MD;  Location: [facility] ENDOSCOPY;  Service: Endoscopy;  Laterality: N/A;    Allergies: Patient has no known allergies.  Medications: Prior to Admission medications   Medication Sig Start Date End Date Taking? Authorizing Provider  diphenoxylate-atropine (LOMOTIL) 2.5-0.025 MG tablet Take 1-2 tablets by mouth 4 (four) times daily as needed for diarrhea or loose stools. 12/28/18  Yes Truitt Merle, MD  finasteride (PROSCAR) 5 MG tablet Take 5 mg by mouth daily.    Yes [provider]  lovastatin (MEVACOR) 40 MG tablet Take 40 mg by mouth daily.    Yes [provider]  naproxen sodium (ALEVE) 220 MG tablet Take 220 mg by mouth daily as needed (pain).   Yes [provider]     Family History  Problem Relation Age of Onset  . Cancer Mother        cancer (unknown type)    Social History    Socioeconomic History  . Marital status: Married    Spouse name: Not on file  . Number of children: Not on file  . Years of education: Not on file  . Highest education level: Not on file  Occupational History  . Not on file  Social Needs  . Financial resource strain: Not on file  . Food insecurity:    Worry: Not on file    Inability: Not on file  . Transportation needs:    Medical: Not on file    Non-medical: Not on file  Tobacco Use  . Smoking status: Former Smoker    Packs/day: 0.50    Years: 10.00    Pack years: 5.00    Last attempt to quit: 1988    Years since quitting: 32.1  . Smokeless tobacco: Never Used  Substance and Sexual Activity  . Alcohol use: Yes    Comment: ocassionally   . Drug use: No  . Sexual activity: Not on file  Lifestyle  . Physical activity:    Days per week: Not on file    Minutes per session: Not on file  . Stress: Not on file  Relationships  . Social connections:    Talks on phone: Not on file    Gets together: Not on file    Attends religious service: Not on file    Active member of club or organization: Not on file    Attends meetings of clubs or organizations: Not on file    Relationship status: Not on file  Other Topics Concern  .  Not on file  Social History Narrative  . Not on file      Review of Systems denies fever, headache, chest pain, dyspnea, cough, abdominal/back pain, nausea, vomiting.  He has had weakness/fatigue, prior rectal bleeding as well as diarrhea.  Vital Signs: BP (!) 149/44 (BP Location: Left Arm)   Pulse 91   Temp 97.9 F (36.6 C) (Oral)   Resp 16   Ht 5\' 10"  (1.778 m)   Wt 163 lb (73.9 kg)   SpO2 98%   BMI 23.39 kg/m   Physical Exam awake, alert.  Chest clear to auscultation bilaterally.  Heart with normal rate, occasional ectopy, soft murmur.  Abdomen soft, positive bowel sounds, nontender.  No lower extremity edema.  Imaging: Ct Abdomen Pelvis W Contrast  Result Date: 12/25/2018 CLINICAL  DATA:  Diarrhea EXAM: CT ABDOMEN AND PELVIS WITH CONTRAST TECHNIQUE: Multidetector CT imaging of the abdomen and pelvis was performed using the standard protocol following bolus administration of intravenous contrast. CONTRAST:  111mL OMNIPAQUE IOHEXOL 300 MG/ML  SOLN COMPARISON:  CT abdomen pelvis 10/28/2017 FINDINGS: LOWER CHEST: There is a posterior right lung base mass measuring 5.4 x 3.8 cm, previously 1.8 x 2.0 cm. There are multiple right lower lobe nodules, measuring up to 2.5 cm. Two of the nodules show cavitation. These findings have worsened since 10/28/2017. HEPATOBILIARY: There is a hypoattenuating lesion in the right hepatic lobe that measures 1.4 cm and is new compared to the prior studies. Remainder of the liver is normal. The gallbladder is normal. PANCREAS: The pancreatic parenchymal contours are normal and there is no ductal dilatation. There is no peripancreatic fluid collection. SPLEEN: Normal. ADRENALS/URINARY TRACT: --Adrenal glands: Normal. --Right kidney/ureter: There are multiple renal cysts, measuring up to 7 cm. The distribution of lesions is unchanged. --Left kidney/ureter: There are multiple renal cysts, the largest of which measures 2.4 cm, unchanged. --Urinary bladder: Multiple large calculi within the urinary bladder. STOMACH/BOWEL: --Stomach/Duodenum: Intermediate sized hiatal hernia. --Small bowel: No dilatation or inflammation. --Colon: There is wall thickening and enhancement of the rectum and distal sigmoid colon. --Appendix: Normal. VASCULAR/LYMPHATIC: There is aortic atherosclerosis without hemodynamically significant stenosis. The portal vein, splenic vein, superior mesenteric vein and IVC are patent. 5 mm right pelvic sidewall node has decreased in size since the prior study. REPRODUCTIVE: Enlarged prostate measures 5.4 x 4.7 cm. MUSCULOSKELETAL. Multilevel degenerative disc disease and facet arthrosis. No bony spinal canal stenosis. OTHER: None. IMPRESSION: 1. Wall  thickening and enhancement of the rectum and distal sigmoid colon, consistent with known rectal carcinoma. 2. New hypoattenuating lesion in the right hepatic lobe, concerning for metastatic disease. 3. Increased size of posterior right lung base mass, now measuring 5.4 x 3.8 cm, previously 1.8 x 2.0 cm. Multiple right lower lobe pulmonary nodules, worsened since 10/28/2017, and consistent with metastatic disease. 4. Multiple large calculi within the urinary bladder. Electronically Signed   By: Ulyses Jarred M.D.   On: 12/25/2018 04:35    Labs:  CBC: Recent Labs    12/24/18 2132  01/05/19 1305 01/05/19 1556 01/07/19 1039 01/11/19 1227  WBC 11.0*  --  11.1*  --  9.3 9.3  HGB 11.4*   < > 9.8* 9.7* 8.6* 8.8*  HCT 38.2*   < > 32.7* 31.5* 27.7* 29.0*  PLT 251  --  277  --  294 325   < > = values in this interval not displayed.    COAGS: No results for input(s): INR, APTT in the last 8760 hours.  BMP:  Recent Labs    12/24/18 2132 12/26/18 0509 01/05/19 1305 01/07/19 1039  NA 142 141 139 145  K 3.7 4.3 3.6 4.0  CL 110 108 108 110  CO2 21* 23 23 26   GLUCOSE 88 88 135* 100*  BUN 18 20 20 16   CALCIUM 8.8* 8.4* 8.2* 9.1  CREATININE 1.08 1.00 1.14 1.08  GFRNONAA >60 >60 59* >60  GFRAA >60 >60 >60 >60    LIVER FUNCTION TESTS: Recent Labs    03/22/18 0931 12/24/18 2132 01/05/19 1305 01/07/19 1039  BILITOT 0.6 0.4 0.9 0.5  AST 13 18 16  12*  ALT 11 15 15 11   ALKPHOS 71 47 51 65  PROT 6.9 6.5 6.3* 6.4*  ALBUMIN 3.5 3.4* 3.3* 3.2*    TUMOR MARKERS: No results for input(s): AFPTM, CEA, CA199, CHROMGRNA in the last 8760 hours.  Assessment and Plan: 85 y.o. male with history of metastatic rectal carcinoma prior chemoradiation.  Recent follow-up imaging has revealed wall thickening and enhancement of the rectum and distal sigmoid consistent with known rectal cancer, new lesion in the right hepatic lobe concerning for metastatic disease, increased size of posterior right lung  base mass and multiple right lower lobe pulmonary nodules.  He presents today for Port-A-Cath placement for palliative chemotherapy.Risks and benefits of image guided port-a-catheter placement was discussed with the patient including, but not limited to bleeding, infection, pneumothorax, or fibrin sheath development and need for additional procedures.  All of the patient's questions were answered, patient is agreeable to proceed. Consent signed and in chart.     Thank you for this interesting consult.  I greatly enjoyed meeting ALBINO BUFFORD and look forward to participating in their care.  A copy of this report was sent to the requesting provider on this date.  Electronically Signed: D. Rowe Robert, PA-C 01/11/2019, 12:46 PM   I spent a total of  25 minutes   in face to face in clinical consultation, greater than 50% of which was counseling/coordinating care for Port-A-Cath placement

## 2019-01-11 NOTE — Discharge Instructions (Addendum)
Keep area clean. Do NOT use the Emla cream until glue and steri strips have come off on their own.  Moderate Conscious Sedation, Adult, Care After These instructions provide you with information about caring for yourself after your procedure. Your health care provider may also give you more specific instructions. Your treatment has been planned according to current medical practices, but problems sometimes occur. Call your health care provider if you have any problems or questions after your procedure. What can I expect after the procedure? After your procedure, it is common:  To feel sleepy for several hours.  To feel clumsy and have poor balance for several hours.  To have poor judgment for several hours.  To vomit if you eat too soon. Follow these instructions at home: For at least 24 hours after the procedure:   Do not: ? Participate in activities where you could fall or become injured. ? Drive. ? Use heavy machinery. ? Drink alcohol. ? Take sleeping pills or medicines that cause drowsiness. ? Make important decisions or sign legal documents. ? Take care of children on your own.  Rest. Eating and drinking  Follow the diet recommended by your health care provider.  If you vomit: ? Drink water, juice, or soup when you can drink without vomiting. ? Make sure you have little or no nausea before eating solid foods. General instructions  Have a responsible adult stay with you until you are awake and alert.  Take over-the-counter and prescription medicines only as told by your health care provider.  If you smoke, do not smoke without supervision.  Keep all follow-up visits as told by your health care provider. This is important. Contact a health care provider if:  You keep feeling nauseous or you keep vomiting.  You feel light-headed.  You develop a rash.  You have a fever. Get help right away if:  You have trouble breathing. This information is not intended to  replace advice given to you by your health care provider. Make sure you discuss any questions you have with your health care provider. Document Released: 09/14/2013 Document Revised: 04/28/2016 Document Reviewed: 03/15/2016 Elsevier Interactive Patient Education  2019 [place] Insertion, Care After This sheet gives you information about how to care for yourself after your procedure. Your health care provider may also give you more specific instructions. If you have problems or questions, contact your health care provider. What can I expect after the procedure? After the procedure, it is common to have:  Discomfort at the port insertion site.  Bruising on the skin over the port. This should improve over 3-4 days. Follow these instructions at home: [facility] care  After your port is placed, you will get a manufacturer's information card. The card has information about your port. Keep this card with you at all times.  Take care of the port as told by your health care provider. Ask your health care provider if you or a family member can get training for taking care of the port at home. A home health care nurse may also take care of the port.  Make sure to remember what type of port you have. Incision care      Follow instructions from your health care provider about how to take care of your port insertion site. Make sure you: ? Wash your hands with soap and water before and after you change your bandage (dressing). If soap and water are not available, use hand sanitizer. ? Change  your dressing as told by your health care provider. ? Leave stitches (sutures), skin glue, or adhesive strips in place. These skin closures may need to stay in place for 2 weeks or longer. If adhesive strip edges start to loosen and curl up, you may trim the loose edges. Do not remove adhesive strips completely unless your health care provider tells you to do that.  Check your port insertion site  every day for signs of infection. Check for: ? Redness, swelling, or pain. ? Fluid or blood. ? Warmth. ? Pus or a bad smell. Activity  Return to your normal activities as told by your health care provider. Ask your health care provider what activities are safe for you.  Do not lift anything that is heavier than 10 lb (4.5 kg), or the limit that you are told, until your health care provider says that it is safe. General instructions  Take over-the-counter and prescription medicines only as told by your health care provider.  Do not take baths, swim, or use a hot tub until your health care provider approves. Ask your health care provider if you may take showers. You may only be allowed to take sponge baths.  Do not drive for 24 hours if you were given a sedative during your procedure.  Wear a medical alert bracelet in case of an emergency. This will tell any health care providers that you have a port.  Keep all follow-up visits as told by your health care provider. This is important. Contact a health care provider if:  You cannot flush your port with saline as directed, or you cannot draw blood from the port.  You have a fever or chills.  You have redness, swelling, or pain around your port insertion site.  You have fluid or blood coming from your port insertion site.  Your port insertion site feels warm to the touch.  You have pus or a bad smell coming from the port insertion site. Get help right away if:  You have chest pain or shortness of breath.  You have bleeding from your port that you cannot control. Summary  Take care of the port as told by your health care provider. Keep the manufacturer's information card with you at all times.  Change your dressing as told by your health care provider.  Contact a health care provider if you have a fever or chills or if you have redness, swelling, or pain around your port insertion site.  Keep all follow-up visits as told by  your health care provider. This information is not intended to replace advice given to you by your health care provider. Make sure you discuss any questions you have with your health care provider. Document Released: 09/14/2013 Document Revised: 06/22/2018 Document Reviewed: 06/22/2018 Elsevier Interactive Patient Education  [employer].

## 2019-01-12 ENCOUNTER — Ambulatory Visit (HOSPITAL_COMMUNITY)
Admission: RE | Admit: 2019-01-12 | Discharge: 2019-01-12 | Disposition: A | Discharge status: Home / Self Care | Payer: Medicare Other | Source: Ambulatory Visit | Attending: Interventional Radiology | Admitting: Interventional Radiology

## 2019-01-12 ENCOUNTER — Encounter (HOSPITAL_COMMUNITY): Payer: Self-pay

## 2019-01-12 ENCOUNTER — Ambulatory Visit (HOSPITAL_COMMUNITY)
Admission: RE | Admit: 2019-01-12 | Discharge: 2019-01-12 | Disposition: A | Discharge status: Home / Self Care | Payer: Medicare Other | Source: Ambulatory Visit | Attending: Hematology | Admitting: Hematology

## 2019-01-12 DIAGNOSIS — Z85048 Personal history of other malignant neoplasm of rectum, rectosigmoid junction, and anus: Secondary | ICD-10-CM | POA: Insufficient documentation

## 2019-01-12 DIAGNOSIS — Z923 Personal history of irradiation: Secondary | ICD-10-CM | POA: Insufficient documentation

## 2019-01-12 DIAGNOSIS — C7801 Secondary malignant neoplasm of right lung: Secondary | ICD-10-CM | POA: Insufficient documentation

## 2019-01-12 DIAGNOSIS — Z79899 Other long term (current) drug therapy: Secondary | ICD-10-CM | POA: Diagnosis not present

## 2019-01-12 DIAGNOSIS — Z9889 Other specified postprocedural states: Secondary | ICD-10-CM

## 2019-01-12 DIAGNOSIS — Z87891 Personal history of nicotine dependence: Secondary | ICD-10-CM | POA: Diagnosis not present

## 2019-01-12 DIAGNOSIS — Z9221 Personal history of antineoplastic chemotherapy: Secondary | ICD-10-CM | POA: Insufficient documentation

## 2019-01-12 DIAGNOSIS — E785 Hyperlipidemia, unspecified: Secondary | ICD-10-CM | POA: Diagnosis not present

## 2019-01-12 DIAGNOSIS — C20 Malignant neoplasm of rectum: Secondary | ICD-10-CM

## 2019-01-12 DIAGNOSIS — R918 Other nonspecific abnormal finding of lung field: Secondary | ICD-10-CM | POA: Diagnosis present

## 2019-01-12 MED ORDER — LIDOCAINE HCL 1 % IJ SOLN
INTRAMUSCULAR | Status: AC
  Filled 2019-01-12: qty 20

## 2019-01-12 MED ORDER — MIDAZOLAM HCL 2 MG/2ML IJ SOLN
INTRAMUSCULAR | Status: AC | PRN
  Administered 2019-01-12: 1 mg via INTRAVENOUS

## 2019-01-12 MED ORDER — FENTANYL CITRATE (PF) 100 MCG/2ML IJ SOLN
INTRAMUSCULAR | Status: AC
  Filled 2019-01-12: qty 2

## 2019-01-12 MED ORDER — FENTANYL CITRATE (PF) 100 MCG/2ML IJ SOLN
INTRAMUSCULAR | Status: AC | PRN
  Administered 2019-01-12: 25 ug via INTRAVENOUS

## 2019-01-12 MED ORDER — MIDAZOLAM HCL 2 MG/2ML IJ SOLN
INTRAMUSCULAR | Status: AC
  Filled 2019-01-12: qty 4

## 2019-01-12 NOTE — Procedures (Signed)
RLL lung mass Bx 18 g times three EBL 0 Comp 0 

## 2019-01-12 NOTE — Discharge Instructions (Addendum)
Needle Biopsy of the Lung, Care After °This sheet gives you information about how to care for yourself after your procedure. Your health care provider may also give you more specific instructions. If you have problems or questions, contact your health care provider. °What can I expect after the procedure? °After the procedure, it is common to have: °· Soreness, pain, and tenderness where a tissue sample was taken (biopsy site). °· A cough. °· A sore throat. °Follow these instructions at home: °Biopsy site care °· Follow instructions from your health care provider about when to remove the bandage that was placed on the biopsy site. °· Keep the bandage dry until it has been removed. °· Check your biopsy site every day for signs of infection. Check for: °? More redness, swelling, or pain. °? More fluid or blood. °? Warmth to the touch. °? Pus or a bad smell. °General instructions ° °· Rest as directed by your health care provider. Ask your health care provider what activities are safe for you. °· Do not take baths, swim, or use a hot tub until your health care provider approves. °· Take over-the-counter and prescription medicines only as told by your health care provider. °· If you have airplane travel scheduled, talk with your health care provider about when it is safe for you to travel by airplane. °· It is up to you to get the results of your procedure. Ask your health care provider, or the department that is doing the procedure, when your results will be ready. °· Keep all follow-up visits as told by your health care provider. This is important. °Contact a health care provider if: °· You have more redness, swelling, or pain around your biopsy site. °· You have more fluid or blood coming from your biopsy site. °· Your biopsy site feels warm to the touch. °· You have pus or a bad smell coming from your biopsy site. °· You have a fever. °· You have pain that does not get better with medicine. °Get help right away  if: °· You have problems breathing. °· You have chest pain. °· You cough up blood. °· You faint. °· You have a fast heart rate. °Summary °· After a needle biopsy of the lung, it is common to have a cough, a sore throat, or soreness, pain, and tenderness where a tissue sample was taken (biopsy site). °· You should check your biopsy area every day for signs of infection, including pus or a bad smell, warmth, more fluid or blood, or more redness, swelling, or pain. °· You should not take baths, swim, or use a hot tub until your health care provider approves. °· It is up to you to get the results of your procedure. Ask your health care provider, or the department that is doing the procedure, when your results will be ready. °This information is not intended to replace advice given to you by your health care provider. Make sure you discuss any questions you have with your health care provider. °Document Released: 09/21/2007 Document Revised: 10/15/2016 Document Reviewed: 10/15/2016 °Elsevier Interactive Patient Education © 2019 Else[employer] Inc. ° ° ° °Moderate Conscious Sedation, Adult, Care After °These instructions provide you with information about caring for yourself after your procedure. Your health care provider may also give you more specific instructions. Your treatment has been planned according to current medical practices, but problems sometimes occur. Call your health care provider if you have any problems or questions after your procedure. °What can I expect   after the procedure? °After your procedure, it is common: °· To feel sleepy for several hours. °· To feel clumsy and have poor balance for several hours. °· To have poor judgment for several hours. °· To vomit if you eat too soon. °Follow these instructions at home: °For at least 24 hours after the procedure: ° °· Do not: °? Participate in activities where you could fall or become injured. °? Drive. °? Use heavy machinery. °? Drink alcohol. °? Take sleeping  pills or medicines that cause drowsiness. °? Make important decisions or sign legal documents. °? Take care of children on your own. °· Rest. °Eating and drinking °· Follow the diet recommended by your health care provider. °· If you vomit: °? Drink water, juice, or soup when you can drink without vomiting. °? Make sure you have little or no nausea before eating solid foods. °General instructions °· Have a responsible adult stay with you until you are awake and alert. °· Take over-the-counter and prescription medicines only as told by your health care provider. °· If you smoke, do not smoke without supervision. °· Keep all follow-up visits as told by your health care provider. This is important. °Contact a health care provider if: °· You keep feeling nauseous or you keep vomiting. °· You feel light-headed. °· You develop a rash. °· You have a fever. °Get help right away if: °· You have trouble breathing. °This information is not intended to replace advice given to you by your health care provider. Make sure you discuss any questions you have with your health care provider. °Document Released: 09/14/2013 Document Revised: 04/28/2016 Document Reviewed: 03/15/2016 °Elsevier Interactive Patient Education © 2019 Elsevier Inc. ° ° °

## 2019-01-12 NOTE — H&P (Signed)
Chief Complaint: Patient was seen in consultation today for possible liver lesion biopsy/possible lung biopsy  Referring Physician(s): Feng,Yan  Supervising Physician: Marybelle Killings  Patient Status: [facility] - Out-pt  History of Present Illness: HELAMAN Banks is a 85 y.o. male with a past medical history significant for HLD and metastatic rectal carcinoma with previous chemoradiation followed by Dr. Burr Medico. He was seen yesterday in IR and successfully had a port placed by Dr. Barbie Banner. He returns today for possible liver lesion biopsy and/or possible lung lesion biopsy. CT abdomen/pelvis with contrast was performed on 12/25/18 due to diarrhea - results showed wall thickening and enhancement of the rectum and distal sigmoid colon, c/w with known rectal carcinoma; new hypoattenuating lesion in the right hepatic lobe, concerning for metastatic disease; increased size of posterior right lung base mass now measuring 5.4 x 3.8 cm, previously 1.8 x 2.0 cm c/w metastatic disease. Request has been made for a biopsy of these lesions to further guide treatment.   Patient reports he feels ok today, has some soreness at his port but not much. He is hungry and looking forward to eating later. He states he is not sure why he is here, just that he was told to come in for tests.  Past Medical History:  Diagnosis Date  . Cancer ([place]) 09/29/2017   Rectal cvancer  . High cholesterol     Past Surgical History:  Procedure Laterality Date  . BIOPSY  12/25/2018   Procedure: BIOPSY;  Surgeon: Wonda Horner, MD;  Location: [facility] ENDOSCOPY;  Service: Endoscopy;;  . FLEXIBLE SIGMOIDOSCOPY N/A 12/25/2018   Procedure: Beryle Quant;  Surgeon: Wonda Horner, MD;  Location: [facility] ENDOSCOPY;  Service: Endoscopy;  Laterality: N/A;  . IR IMAGING GUIDED PORT INSERTION  01/11/2019    Allergies: Patient has no known allergies.  Medications: Prior to Admission medications   Medication Sig Start Date End Date Taking?  Authorizing Provider  diphenoxylate-atropine (LOMOTIL) 2.5-0.025 MG tablet Take 1-2 tablets by mouth 4 (four) times daily as needed for diarrhea or loose stools. 12/28/18   Truitt Merle, MD  finasteride (PROSCAR) 5 MG tablet Take 5 mg by mouth daily.     [provider]  lovastatin (MEVACOR) 40 MG tablet Take 40 mg by mouth daily.     [provider]  naproxen sodium (ALEVE) 220 MG tablet Take 220 mg by mouth daily as needed (pain).    [provider]     Family History  Problem Relation Age of Onset  . Cancer Mother        cancer (unknown type)    Social History   Socioeconomic History  . Marital status: Married    Spouse name: Not on file  . Number of children: Not on file  . Years of education: Not on file  . Highest education level: Not on file  Occupational History  . Not on file  Social Needs  . Financial resource strain: Not on file  . Food insecurity:    Worry: Not on file    Inability: Not on file  . Transportation needs:    Medical: Not on file    Non-medical: Not on file  Tobacco Use  . Smoking status: Former Smoker    Packs/day: 0.50    Years: 10.00    Pack years: 5.00    Last attempt to quit: 1988    Years since quitting: 32.1  . Smokeless tobacco: Never Used  Substance and Sexual Activity  . Alcohol  use: Yes    Comment: ocassionally   . Drug use: No  . Sexual activity: Not on file  Lifestyle  . Physical activity:    Days per week: Not on file    Minutes per session: Not on file  . Stress: Not on file  Relationships  . Social connections:    Talks on phone: Not on file    Gets together: Not on file    Attends religious service: Not on file    Active member of club or organization: Not on file    Attends meetings of clubs or organizations: Not on file    Relationship status: Not on file  Other Topics Concern  . Not on file  Social History Narrative  . Not on file     Review of Systems: A 12 point ROS discussed and  pertinent positives are indicated in the HPI above.  All other systems are negative.  Review of Systems  Constitutional: Negative for chills and fever.  Respiratory: Negative for cough and shortness of breath.   Cardiovascular: Negative for chest pain.  Gastrointestinal: Negative for abdominal pain, diarrhea, nausea and vomiting.  Neurological: Negative for headaches.  Psychiatric/Behavioral: Negative for confusion.    Vital Signs: There were no vitals taken for this visit.  Physical Exam Vitals signs reviewed.  Constitutional:      General: He is not in acute distress. HENT:     Head: Normocephalic.  Cardiovascular:     Rate and Rhythm: Normal rate and regular rhythm.     Comments: (+) right sided port - covered appropriately. Pulmonary:     Effort: Pulmonary effort is normal.     Breath sounds: Normal breath sounds.  Abdominal:     General: There is no distension.     Palpations: Abdomen is soft.     Tenderness: There is no abdominal tenderness.  Skin:    General: Skin is warm and dry.  Neurological:     Mental Status: He is alert and oriented to person, place, and time.  Psychiatric:        Mood and Affect: Mood normal.        Behavior: Behavior normal.        Thought Content: Thought content normal.        Judgment: Judgment normal.      MD Evaluation Airway: WNL(partial bottom denture) Heart: WNL Abdomen: WNL Chest/ Lungs: WNL ASA  Classification: 3 Mallampati/Airway Score: Two   Imaging: Ct Abdomen Pelvis W Contrast  Result Date: 12/25/2018 CLINICAL DATA:  Diarrhea EXAM: CT ABDOMEN AND PELVIS WITH CONTRAST TECHNIQUE: Multidetector CT imaging of the abdomen and pelvis was performed using the standard protocol following bolus administration of intravenous contrast. CONTRAST:  120mL OMNIPAQUE IOHEXOL 300 MG/ML  SOLN COMPARISON:  CT abdomen pelvis 10/28/2017 FINDINGS: LOWER CHEST: There is a posterior right lung base mass measuring 5.4 x 3.8 cm, previously 1.8  x 2.0 cm. There are multiple right lower lobe nodules, measuring up to 2.5 cm. Two of the nodules show cavitation. These findings have worsened since 10/28/2017. HEPATOBILIARY: There is a hypoattenuating lesion in the right hepatic lobe that measures 1.4 cm and is new compared to the prior studies. Remainder of the liver is normal. The gallbladder is normal. PANCREAS: The pancreatic parenchymal contours are normal and there is no ductal dilatation. There is no peripancreatic fluid collection. SPLEEN: Normal. ADRENALS/URINARY TRACT: --Adrenal glands: Normal. --Right kidney/ureter: There are multiple renal cysts, measuring up to 7 cm. The distribution of  lesions is unchanged. --Left kidney/ureter: There are multiple renal cysts, the largest of which measures 2.4 cm, unchanged. --Urinary bladder: Multiple large calculi within the urinary bladder. STOMACH/BOWEL: --Stomach/Duodenum: Intermediate sized hiatal hernia. --Small bowel: No dilatation or inflammation. --Colon: There is wall thickening and enhancement of the rectum and distal sigmoid colon. --Appendix: Normal. VASCULAR/LYMPHATIC: There is aortic atherosclerosis without hemodynamically significant stenosis. The portal vein, splenic vein, superior mesenteric vein and IVC are patent. 5 mm right pelvic sidewall node has decreased in size since the prior study. REPRODUCTIVE: Enlarged prostate measures 5.4 x 4.7 cm. MUSCULOSKELETAL. Multilevel degenerative disc disease and facet arthrosis. No bony spinal canal stenosis. OTHER: None. IMPRESSION: 1. Wall thickening and enhancement of the rectum and distal sigmoid colon, consistent with known rectal carcinoma. 2. New hypoattenuating lesion in the right hepatic lobe, concerning for metastatic disease. 3. Increased size of posterior right lung base mass, now measuring 5.4 x 3.8 cm, previously 1.8 x 2.0 cm. Multiple right lower lobe pulmonary nodules, worsened since 10/28/2017, and consistent with metastatic disease. 4.  Multiple large calculi within the urinary bladder. Electronically Signed   By: Ulyses Jarred M.D.   On: 12/25/2018 04:35   Ir Imaging Guided Port Insertion  Result Date: 01/11/2019 CLINICAL DATA:  Rectal cancer EXAM: TUNNEL POWER PORT PLACEMENT WITH SUBCUTANEOUS POCKET UTILIZING ULTRASOUND & FLOUROSCOPY FLUOROSCOPY TIME:  12 seconds.  Four mGy MEDICATIONS AND MEDICAL HISTORY: Versed 1.5 mg, Fentanyl 50 mcg. Additional Medications: Ancef 2 g. Antibiotics were given within 2 hours of the procedure. ANESTHESIA/SEDATION: Moderate sedation time: 27 minutes. Nursing monitored the the patient during the procedure. PROCEDURE: After written informed consent was obtained, patient was placed in the supine position on angiographic table. The right neck and chest was prepped and draped in a sterile fashion. Lidocaine was utilized for local anesthesia. The right jugular vein was noted to be patent initially with ultrasound. Under sonographic guidance, a micropuncture needle was inserted into the right IJ vein (Ultrasound and fluoroscopic image documentation was performed). The needle was removed over an 018 wire which was exchanged for a Amplatz. This was advanced into the IVC. An 8-French dilator was advanced over the Amplatz. A small incision was made in the right upper chest over the anterior right second rib. Utilizing blunt dissection, a subcutaneous pocket was created in the caudal direction. The pocket was irrigated with a copious amount of sterile normal saline. The port catheter was tunneled from the chest incision, and out the neck incision. The reservoir was inserted into the subcutaneous pocket and secured with two 3-0 Ethilon stitches. A peel-away sheath was advanced over the Amplatz wire. The port catheter was cut to measure length and inserted through the peel-away sheath. The peel-away sheath was removed. The chest incision was closed with 3-0 Vicryl interrupted stitches for the subcutaneous tissue and a  running of 4-0 Vicryl subcuticular stitch for the skin. The neck incision was closed with a 4-0 Vicryl subcuticular stitch. Derma-bond was applied to both surgical incisions. The port reservoir was flushed and instilled with heparinized saline. No complications. FINDINGS: A right IJ vein Port-A-Cath is in place with its tip at the cavoatrial junction. COMPLICATIONS: None IMPRESSION: Successful 8 French right internal jugular vein power port placement with its tip at the SVC/RA junction. Electronically Signed   By: Marybelle Killings M.D.   On: 01/11/2019 16:31    Labs:  CBC: Recent Labs    12/24/18 2132  01/05/19 1305 01/05/19 1556 01/07/19 1039 01/11/19 1227  WBC 11.0*  --  11.1*  --  9.3 9.3  HGB 11.4*   < > 9.8* 9.7* 8.6* 8.8*  HCT 38.2*   < > 32.7* 31.5* 27.7* 29.0*  PLT 251  --  277  --  294 325   < > = values in this interval not displayed.    COAGS: Recent Labs    01/11/19 1227  INR 1.17    BMP: Recent Labs    12/24/18 2132 12/26/18 0509 01/05/19 1305 01/07/19 1039  NA 142 141 139 145  K 3.7 4.3 3.6 4.0  CL 110 108 108 110  CO2 21* 23 23 26   GLUCOSE 88 88 135* 100*  BUN 18 20 20 16   CALCIUM 8.8* 8.4* 8.2* 9.1  CREATININE 1.08 1.00 1.14 1.08  GFRNONAA >60 >60 59* >60  GFRAA >60 >60 >60 >60    LIVER FUNCTION TESTS: Recent Labs    03/22/18 0931 12/24/18 2132 01/05/19 1305 01/07/19 1039  BILITOT 0.6 0.4 0.9 0.5  AST 13 18 16  12*  ALT 11 15 15 11   ALKPHOS 71 47 51 65  PROT 6.9 6.5 6.3* 6.4*  ALBUMIN 3.5 3.4* 3.3* 3.2*    TUMOR MARKERS: No results for input(s): AFPTM, CEA, CA199, CHROMGRNA in the last 8760 hours.  Assessment and Plan:  Patient with known metastatic rectal cancer previously treated with chemoradiation and followed by Dr. Burr Medico who presents today for possible liver lesion biopsy and/or possible lung lesion biopsy to direct further management.  Patient has been NPO since last night, he does not take blood thinning medications. Labs were  performed yesterday prior to port placement - hgb 8.8, plt 325, INR 1.17.  Consents signed and in chart. Risks and benefits discussed with the patient including, but not limited to infection, damage to adjacent structures, bleeding, hemoptysis, respiratory failure requiring intubation, infection, pneumothorax requiring chest tube placement, stroke from air embolism or even death.  All of the patient's questions were answered, patient is agreeable to proceed.  Consent signed and in chart.  Thank you for this interesting consult.  I greatly enjoyed meeting JAMICAH ANSTEAD and look forward to participating in their care.  A copy of this report was sent to the requesting provider on this date.  Electronically Signed: Joaquim Nam, PA-C 01/12/2019, 7:50 AM   I spent a total of15 Minutes in face to face in clinical consultation, greater than 50% of which was counseling/coordinating care for possible liver lesion biopsy/possible lung lesion biopsy.

## 2019-01-12 NOTE — Progress Notes (Signed)
Per Dr Barbie Banner, followup cxr ok and okay to feed client and discharge home

## 2019-01-14 ENCOUNTER — Inpatient Hospital Stay: Payer: Medicare Other

## 2019-01-14 ENCOUNTER — Inpatient Hospital Stay: Payer: Medicare Other | Admitting: Hematology

## 2019-01-14 ENCOUNTER — Inpatient Hospital Stay: Payer: Medicare Other | Attending: Hematology

## 2019-01-14 ENCOUNTER — Inpatient Hospital Stay (HOSPITAL_BASED_OUTPATIENT_CLINIC_OR_DEPARTMENT_OTHER): Payer: Medicare Other | Admitting: Hematology

## 2019-01-14 ENCOUNTER — Ambulatory Visit: Payer: Medicare Other

## 2019-01-14 ENCOUNTER — Encounter: Payer: Self-pay | Admitting: Hematology

## 2019-01-14 ENCOUNTER — Telehealth: Payer: Self-pay | Admitting: Hematology

## 2019-01-14 VITALS — BP 135/74 | HR 72 | Temp 98.3°F | Resp 18

## 2019-01-14 VITALS — BP 129/70 | HR 70 | Temp 97.4°F | Resp 18 | Ht 70.0 in | Wt 155.4 lb

## 2019-01-14 DIAGNOSIS — D509 Iron deficiency anemia, unspecified: Secondary | ICD-10-CM | POA: Diagnosis not present

## 2019-01-14 DIAGNOSIS — C20 Malignant neoplasm of rectum: Secondary | ICD-10-CM | POA: Diagnosis not present

## 2019-01-14 DIAGNOSIS — R197 Diarrhea, unspecified: Secondary | ICD-10-CM | POA: Insufficient documentation

## 2019-01-14 DIAGNOSIS — Z923 Personal history of irradiation: Secondary | ICD-10-CM | POA: Insufficient documentation

## 2019-01-14 DIAGNOSIS — R918 Other nonspecific abnormal finding of lung field: Secondary | ICD-10-CM

## 2019-01-14 DIAGNOSIS — Z79899 Other long term (current) drug therapy: Secondary | ICD-10-CM | POA: Diagnosis not present

## 2019-01-14 DIAGNOSIS — Z5111 Encounter for antineoplastic chemotherapy: Secondary | ICD-10-CM | POA: Insufficient documentation

## 2019-01-14 DIAGNOSIS — Z9221 Personal history of antineoplastic chemotherapy: Secondary | ICD-10-CM

## 2019-01-14 DIAGNOSIS — D500 Iron deficiency anemia secondary to blood loss (chronic): Secondary | ICD-10-CM

## 2019-01-14 DIAGNOSIS — C787 Secondary malignant neoplasm of liver and intrahepatic bile duct: Secondary | ICD-10-CM | POA: Insufficient documentation

## 2019-01-14 LAB — COMPREHENSIVE METABOLIC PANEL
ALT: 7 U/L (ref 0–44)
AST: 12 U/L — ABNORMAL LOW (ref 15–41)
Albumin: 3.2 g/dL — ABNORMAL LOW (ref 3.5–5.0)
Alkaline Phosphatase: 64 U/L (ref 38–126)
Anion gap: 8 (ref 5–15)
BUN: 18 mg/dL (ref 8–23)
CHLORIDE: 110 mmol/L (ref 98–111)
CO2: 25 mmol/L (ref 22–32)
Calcium: 8.8 mg/dL — ABNORMAL LOW (ref 8.9–10.3)
Creatinine, Ser: 0.94 mg/dL (ref 0.61–1.24)
GFR calc Af Amer: >60 mL/min (ref >60)
GFR calc non Af Amer: >60 mL/min (ref >60)
Glucose, Bld: 98 mg/dL (ref 70–99)
Potassium: 3.6 mmol/L (ref 3.5–5.1)
Sodium: 143 mmol/L (ref 135–145)
Total Bilirubin: 0.7 mg/dL (ref 0.3–1.2)
Total Protein: 6.4 g/dL — ABNORMAL LOW (ref 6.5–8.1)

## 2019-01-14 LAB — CBC WITH DIFFERENTIAL/PLATELET
ABS IMMATURE GRANULOCYTES: 0.05 10*3/uL (ref 0.00–0.07)
Basophils Absolute: 0.1 10*3/uL (ref 0.0–0.1)
Basophils Relative: 1 %
Eosinophils Absolute: 0.2 10*3/uL (ref 0.0–0.5)
Eosinophils Relative: 3 %
HCT: 24.8 % — ABNORMAL LOW (ref 39.0–52.0)
Hemoglobin: 7.7 g/dL — ABNORMAL LOW (ref 13.0–17.0)
Immature Granulocytes: 1 %
Lymphocytes Relative: 7 %
Lymphs Abs: 0.6 10*3/uL — ABNORMAL LOW (ref 0.7–4.0)
MCH: 24.9 pg — ABNORMAL LOW (ref 26.0–34.0)
MCHC: 31 g/dL (ref 30.0–36.0)
MCV: 80.3 fL (ref 80.0–100.0)
MONO ABS: 0.7 10*3/uL (ref 0.1–1.0)
Monocytes Relative: 8 %
NEUTROS ABS: 6.3 10*3/uL (ref 1.7–7.7)
Neutrophils Relative %: 80 %
Platelets: 313 10*3/uL (ref 150–400)
RBC: 3.09 MIL/uL — ABNORMAL LOW (ref 4.22–5.81)
RDW: 15.7 % — ABNORMAL HIGH (ref 11.5–15.5)
WBC: 7.9 10*3/uL (ref 4.0–10.5)
nRBC: 0 % (ref 0.0–0.2)

## 2019-01-14 MED ORDER — SODIUM CHLORIDE 0.9 % IV SOLN
INTRAVENOUS | Status: DC
  Administered 2019-01-14: 12:00:00 via INTRAVENOUS
  Filled 2019-01-14: qty 250

## 2019-01-14 MED ORDER — DIPHENOXYLATE-ATROPINE 2.5-0.025 MG PO TABS: 1.0000 | ORAL_TABLET | Freq: Four times a day (QID) | ORAL | 1 refills | Status: DC | PRN

## 2019-01-14 MED ORDER — PROCHLORPERAZINE MALEATE 10 MG PO TABS: 10.0000 mg | ORAL_TABLET | Freq: Four times a day (QID) | ORAL | 1 refills | Status: AC | PRN

## 2019-01-14 MED ORDER — LIDOCAINE-PRILOCAINE 2.5-2.5 % EX CREA: TOPICAL_CREAM | CUTANEOUS | 3 refills | Status: AC

## 2019-01-14 MED ORDER — SODIUM CHLORIDE 0.9% FLUSH
10.0000 mL | INTRAVENOUS | Status: DC | PRN
  Administered 2019-01-14: 10 mL
  Filled 2019-01-14: qty 10

## 2019-01-14 MED ORDER — ONDANSETRON HCL 8 MG PO TABS: 8.0000 mg | ORAL_TABLET | Freq: Two times a day (BID) | ORAL | 1 refills | Status: AC | PRN

## 2019-01-14 MED ORDER — HEPARIN SOD (PORK) LOCK FLUSH 100 UNIT/ML IV SOLN
500.0000 [IU] | Freq: Once | INTRAVENOUS | Status: AC | PRN
  Administered 2019-01-14: 500 [IU]
  Filled 2019-01-14: qty 5

## 2019-01-14 MED ORDER — SODIUM CHLORIDE 0.9 % IV SOLN
510.0000 mg | Freq: Once | INTRAVENOUS | Status: AC
  Administered 2019-01-14: 510 mg via INTRAVENOUS
  Filled 2019-01-14: qty 17

## 2019-01-14 NOTE — Progress Notes (Signed)
[place]   [phone] Fax:([phone]) [phone]   Clinic Follow up Note   Patient Care Team: Wenda Low, MD as PCP - General (Internal Medicine)  Date of Service:  01/14/2019  CHIEF COMPLAINT: F/u on metastatic rectal cancer  SUMMARY OF ONCOLOGIC HISTORY: Oncology History   Cancer Staging Rectal cancer [facility]) Staging form: Colon and Rectum, AJCC 8th Edition - Clinical stage from 09/29/2017: Stage IVA (cTX, cNX, cM1a) - Signed by Truitt Merle, MD on 03/21/2018      Rectal cancer ([place])   09/29/2017 Procedure    Colonoscopy by Dr. Penelope Coop 09/29/17  Findings:  The perianal and digital rectal examination were normal.  A non-obstructing large mass was found in the proximal rectum and in the mid rectum.  The mass was circumferential.  This was biopsied with a cold forceps for histology.  Area was tattooed with an injection of [place] ink both distal and proximal to the mass.  There is about 4-5 cm of rectal mucosa distal to the mass that looks norma. This mass is about 7-8 cm in length.  A 10 mm polyp was found in the ascending colon. The polyp was sessile. the polyp was removed with a hot snare.  Resection and retrieval were complete.      09/29/2017 Initial Biopsy    FINAL DIAGNOSIS 09/29/17  1. LG Intestine - Ascending Colon Polyp:  TUBULAR ADENOMA 2. LG Intestine - Rectum, Bx: INVASIVE MODERATELY DIFFERENTIATED ADENOCARCINOMA. THE MSI TESTING IS PENDING AND THE RESULT WILL BE REPORTED AS AN ADDENDUM.    09/29/2017 Cancer Staging    Staging form: Colon and Rectum, AJCC 8th Edition - Clinical stage from 09/29/2017: Stage IVA (cTX, cNX, cM1a) - Signed by Truitt Merle, MD on 03/21/2018    09/29/2017 Miscellaneous    Initial Biopsy MMR Results: MSI Stable      10/21/2017 Initial Diagnosis    Rectal cancer ([place])    10/28/2017 Imaging    CT CAP W Contrast  IMPRESSION: 1. Long segment circumferential wall thickening of the rectosigmoid colon consistent with  known adenocarcinoma. No evidence of bowel obstruction or perforation. 2. No definite signs of metastatic disease in the abdomen or pelvis. Soft tissue nodularity in the right pelvis is probably due to an unopacified pelvic vein. 3. Multiple pulmonary nodules, some cavitary. These are not typical of metastatic adenocarcinoma (especially without evidence of abdominal disease). Consider multicentric or metastatic lung cancer. PET-CT may be helpful to assess hypermetabolic activity and guided tissue sampling. 4. Multiple bladder calculi consistent with chronic bladder outlet obstruction from prostatomegaly. Nonobstructing right renal calculus. No hydronephrosis. 5. Aortic Atherosclerosis (ICD10-I70.0). Additional incidental findings including a moderate size hiatal hernia and renal cysts.     11/05/2017 - 12/18/2017 Radiation Therapy    Radiation with Dr. Lisbeth Renshaw With Xeloda    11/09/2017 - 12/18/2017 Chemotherapy    Chemoradiation with Xeloda 866m/m2 q12h, M-F on days of radiation plan to starting 11/09/17    11/11/2017 Imaging    PET Scan  IMPRESSION:  Wall thickening/ mass involving the upper rectum, corresponding to the patient's known rectal adenocarcinoma. Bilateral pulmonary nodules, partially cavitary in the left upper lobe nodule, suspicious for metastases. Notably, cavitary metastases are more common with squamous cell carcinoma rather than the patient's known adenocarcinoma.     12/25/2018 Procedure    12/25/2018 Sigmoidoscopy Impression: - Malignant tumor in the proximal rectum, in the mid rectum and in the recto-sigmoid colon. Biopsied.      12/25/2018 Pathology Results  Diagnosis 12/25/18 Rectum, biopsy - ADENOCARCINOMA. Microscopic Comment    12/25/2018 Imaging    12/25/2018 CT AP IMPRESSION: 1. Wall thickening and enhancement of the rectum and distal sigmoid colon, consistent with known rectal carcinoma. 2. New hypoattenuating lesion in the right hepatic lobe,  concerning for metastatic disease. 3. Increased size of posterior right lung base mass, now measuring 5.4 x 3.8 cm, previously 1.8 x 2.0 cm. Multiple right lower lobe pulmonary nodules, worsened since 10/28/2017, and consistent with metastatic disease. 4. Multiple large calculi within the urinary bladder.    01/12/2019 Pathology Results    Diagnosis Lung, biopsy - METASTATIC ADENOCARCINOMA WITH NECROSIS, CONSISTENT WITH PATIENT'S CLINICAL HISTORY OF PRIMARY COLORECTAL CARCINOMA.     Chemotherapy    PENDING FOLFOX every 2 week starting next week      CURRENT THERAPY:  PENDING FOLFOX every 2 weeks   INTERVAL HISTORY:  Adrian Banks is here for a follow up and discuss treatment plan. He presents to the clinic today with his family members. He notes he feels abdominal bloating which curbs his appetite. He denied rectal pain but notes significant diarrhea every 3 hours, about 6 times a day. He has not taken lomotil since his last rectal bleeding. His family notes they did not help much.  He denies having neuropathy of being diabetic.      REVIEW OF SYSTEMS:   Constitutional: Denies fevers, chills or abnormal weight loss Eyes: Denies blurriness of vision Ears, nose, mouth, throat, and face: Denies mucositis or sore throat Respiratory: Denies cough, dyspnea or wheezes Cardiovascular: Denies palpitation, chest discomfort or lower extremity swelling Gastrointestinal:  Denies nausea, heartburn (+) Significant diarrhea  Skin: Denies abnormal skin rashes Lymphatics: Denies new lymphadenopathy or easy bruising Neurological:Denies numbness, tingling or new weaknesses Behavioral/Psych: Mood is stable, no new changes  All other systems were reviewed with the patient and are negative.  MEDICAL HISTORY:  Past Medical History:  Diagnosis Date  . Cancer ([place]) 09/29/2017   Rectal cvancer  . High cholesterol     SURGICAL HISTORY: Past Surgical History:  Procedure Laterality Date  .  BIOPSY  12/25/2018   Procedure: BIOPSY;  Surgeon: Wonda Horner, MD;  Location: [facility] ENDOSCOPY;  Service: Endoscopy;;  . FLEXIBLE SIGMOIDOSCOPY N/A 12/25/2018   Procedure: Beryle Quant;  Surgeon: Wonda Horner, MD;  Location: [facility] ENDOSCOPY;  Service: Endoscopy;  Laterality: N/A;  . IR IMAGING GUIDED PORT INSERTION  01/11/2019    I have reviewed the social history and family history with the patient and they are unchanged from previous note.  ALLERGIES:  has No Known Allergies.  MEDICATIONS:  Current Outpatient Medications  Medication Sig Dispense Refill  . diphenoxylate-atropine (LOMOTIL) 2.5-0.025 MG tablet Take 1-2 tablets by mouth 4 (four) times daily as needed for diarrhea or loose stools. 30 tablet 1  . finasteride (PROSCAR) 5 MG tablet Take 5 mg by mouth daily.     Marland Kitchen lovastatin (MEVACOR) 40 MG tablet Take 40 mg by mouth daily.     . naproxen sodium (ALEVE) 220 MG tablet Take 220 mg by mouth daily as needed (pain).     No current facility-administered medications for this visit.     PHYSICAL EXAMINATION: ECOG PERFORMANCE STATUS: 1 - Symptomatic but completely ambulatory  Vitals:   01/14/19 1109  BP: 129/70  Pulse: 70  Resp: 18  Temp: (!) 97.4 F (36.3 C)  SpO2: 100%   Filed Weights   01/14/19 1109  Weight: 155 lb 6.4 oz (70.5 kg)  GENERAL:alert, no distress and comfortable SKIN: skin color, texture, turgor are normal, no rashes or significant lesions EYES: normal, Conjunctiva are pink and non-injected, sclera clear OROPHARYNX:no exudate, no erythema and lips, buccal mucosa, and tongue normal  NECK: supple, thyroid normal size, non-tender, without nodularity LYMPH:  no palpable lymphadenopathy in the cervical, axillary or inguinal LUNGS: clear to auscultation and percussion with normal breathing effort HEART: regular rate & rhythm and no murmurs and no lower extremity edema ABDOMEN:abdomen soft, non-tender and normal bowel sounds Musculoskeletal:no cyanosis  of digits and no clubbing  NEURO: alert & oriented x 3 with fluent speech, no focal motor/sensory deficits  LABORATORY DATA:  I have reviewed the data as listed CBC Latest Ref Rng & Units 01/14/2019 01/11/2019 01/07/2019  WBC 4.0 - 10.5 K/uL 7.9 9.3 9.3  Hemoglobin 13.0 - 17.0 g/dL 7.7(L) 8.8(L) 8.6(L)  Hematocrit 39.0 - 52.0 % 24.8(L) 29.0(L) 27.7(L)  Platelets 150 - 400 K/uL 313 325 294     CMP Latest Ref Rng & Units 01/14/2019 01/07/2019 01/05/2019  Glucose 70 - 99 mg/dL 98 100(H) 135(H)  BUN 8 - 23 mg/dL _0 Creatinine 0.61 - 1.24 mg/dL 0.94 1.08 1.14  Sodium 135 - 145 mmol/L 143 145 139  Potassium 3.5 - 5.1 mmol/L 3.6 4.0 3.6  Chloride 98 - 111 mmol/L 110 110 108  CO2 22 - 32 mmol/L _1 Calcium 8.9 - 10.3 mg/dL 8.8(L) 9.1 8.2(L)  Total Protein 6.5 - 8.1 g/dL 6.4(L) 6.4(L) 6.3(L)  Total Bilirubin 0.3 - 1.2 mg/dL 0.7 0.5 0.9  Alkaline Phos 38 - 126 U/L 64 65 51  AST 15 - 41 U/L 12(L) 12(L) 16  ALT 0 - 44 U/L _2 RADIOGRAPHIC STUDIES: I have personally reviewed the radiological images as listed and agreed with the findings in the report. No results found.   ASSESSMENT & PLAN:  Adrian Banks is a 85 y.o. male with   1. Rectal Cancer, invasive adenocarcinoma, with lung and liver metastasis, cTxNxM1, stage IV  -Diagnosed in 09/2017. His initial staging scans showed bilateral lung nodules, highly suspicious for lung metastasis. He declined biopsy of his lung lesions. He was treated with radiation and oral chemo Xeloda to his primary rectal cancer. I last saw him in 03/2018 and he lost f/u since then.  -Unfortunately his recent CT showed new liver mets and worsening lung metastasis. His 12/25/18 rectal biopsy and 01/12/19 Lung biopsy has confirmed rectal caner recurrence with lung metastasis. I reviewed these in detail with patient and family.  -I discussed his disease not curable at this stage, but still treatable.  The goal of therapy is to prolong his life and  preserve his quality of life. -We discussed the treatment option for metastatic rectal cancer, mainly chemotherapy, and the role of immunotherapy and targeted therapy, based on his molecular test results -I will request [facility] on his recent biopsy sample  -I discussed chemo options of FOLFOX or FOLFIRI every 2 weeks. Given his significant diarrhea and age I recommend he start with FOLOX at reduced dose. If he has trouble to tolerate will dose reduce accordingly or hold Oxaliplatin.  --Chemotherapy consent: Side effects including but does not limited to, fatigue, nausea, vomiting, diarrhea, hair loss, cold sensitivity and neuropathy, fluid retention, renal and kidney dysfunction, neutropenic fever, needed for blood transfusion, bleeding, were discussed with patient in great detail. He agreed. He agreed to proceed.  -Due to his advanced  age, I would likely change FOLFOX to 5-FU maintenance therapy after 3-4 months therapy if he has good response  -Will repeat tumor marker monthly to monitor his response to treatment.  -Will do CT Chest for staging and for baseline next week.  -Plan to start treatment next week.  -F/u next week    2. Anemia, secondary to rectal bleeding and iron deficiency   -new since early 2019 -probably related to his rectal cancer bleeding -will check iron study periodically.  -Hg at 7.7 today (01/14/19). Will receive 1 dose IV iron today and 2nd dose next week. I discussed there is risk for allergy reaction from infusion, if this happened at home he should take benadryl.   3. Diarrhea, Rectal Bleeding  -Secondary to rectal cancer recurrence   -I discussed take miralax 2-3 times a day to help empty his colon. If he still has frequent bowel movements afterward, he can take lomotil or imodium as needed up to 4-5 times a day.  -I discussed chemo treatment will improve symptoms   4. Goal of care discussion  -The patient understands the goal of care is palliative and his  cancer is not curable  -he is full code now    Plan  -IV iron today and next week -I refilled lomotil today and prescribed antiemetics and EMLA cream -CT chest wo contrast next week -Lab, flush, f/u with me or Lattie Haw and chemo FOLFOX every 2 weeks x3, starting 2/13 morning  -due to his advanced age, I will dose reduce oxaliplatin to 60 mg/m, and reduce 5-FU dose for first cycle also.  -will request FO     No problem-specific Assessment & Plan notes found for this encounter.   No orders of the defined types were placed in this encounter.  All questions were answered. The patient knows to call the clinic with any problems, questions or concerns. No barriers to learning was detected. I spent 30 minutes counseling the patient face to face. The total time spent in the appointment was 40 minutes and more than 50% was on counseling and review of test results     Truitt Merle, MD 01/14/2019   I, Joslyn Devon, am acting as scribe for Truitt Merle, MD.   I have reviewed the above documentation for accuracy and completeness, and I agree with the above.

## 2019-01-14 NOTE — Patient Instructions (Signed)

## 2019-01-14 NOTE — Telephone Encounter (Signed)
Scheduled appt per 02/07 los. Called and scheduled CT chest wo contrast for next week. Printed calendar and avs.  Added treatment to the book for approval.

## 2019-01-14 NOTE — Telephone Encounter (Deleted)
Scheduled appt per 02/07 los. Called and scheduled CT chest wo contrast for next week. Printed calendar and avs.

## 2019-01-14 NOTE — Progress Notes (Signed)
START ON PATHWAY REGIMEN - Colorectal     A cycle is every 14 days:     Oxaliplatin      Leucovorin      5-Fluorouracil      5-Fluorouracil      Bevacizumab-xxxx   **Always confirm dose/schedule in your pharmacy ordering system**  Patient Characteristics: Distant Metastases, First Line, Nonsurgical Candidate, KRAS Mutation Positive/Unknown, BRAF Wild-Type/Unknown, PS = 0,1; Bevacizumab Eligible Therapeutic Status: Distant Metastases BRAF Mutation Status: Awaiting Test Results KRAS/NRAS Mutation Status: Awaiting Test Results Line of Therapy: First Line Performance Status: PS = 0, 1 Bevacizumab Eligibility: Eligible Intent of Therapy: Non-Curative / Palliative Intent, Discussed with Patient 

## 2019-01-17 ENCOUNTER — Telehealth: Payer: Self-pay | Admitting: Hematology

## 2019-01-17 NOTE — Telephone Encounter (Signed)
Called patient and informed the patient that his treatment appts have been added for 2/13 and 2/27.

## 2019-01-18 ENCOUNTER — Other Ambulatory Visit: Payer: Medicare Other

## 2019-01-18 ENCOUNTER — Ambulatory Visit: Payer: Medicare Other | Admitting: Hematology

## 2019-01-18 NOTE — Progress Notes (Signed)
[place]   [phone] [phone]   Clinic Follow up Note   Patient Care Team: Wenda Low, MD as PCP - General (Internal Medicine) 01/20/2019  CHIEF COMPLAINT: F/u on metastatic rectal cancer  SUMMARY OF ONCOLOGIC HISTORY: Oncology History   Cancer Staging Rectal cancer [facility]) Staging form: Colon and Rectum, AJCC 8th Edition - Clinical stage from 09/29/2017: Stage IVA (cTX, cNX, cM1a) - Signed by Truitt Merle, MD on 03/21/2018      Rectal cancer ([place])   09/29/2017 Procedure    Colonoscopy by Dr. Penelope Coop 09/29/17  Findings:  The perianal and digital rectal examination were normal.  A non-obstructing large mass was found in the proximal rectum and in the mid rectum.  The mass was circumferential.  This was biopsied with a cold forceps for histology.  Area was tattooed with an injection of [place] ink both distal and proximal to the mass.  There is about 4-5 cm of rectal mucosa distal to the mass that looks norma. This mass is about 7-8 cm in length.  A 10 mm polyp was found in the ascending colon. The polyp was sessile. the polyp was removed with a hot snare.  Resection and retrieval were complete.      09/29/2017 Initial Biopsy    FINAL DIAGNOSIS 09/29/17  1. LG Intestine - Ascending Colon Polyp:  TUBULAR ADENOMA 2. LG Intestine - Rectum, Bx: INVASIVE MODERATELY DIFFERENTIATED ADENOCARCINOMA. THE MSI TESTING IS PENDING AND THE RESULT WILL BE REPORTED AS AN ADDENDUM.    09/29/2017 Cancer Staging    Staging form: Colon and Rectum, AJCC 8th Edition - Clinical stage from 09/29/2017: Stage IVA (cTX, cNX, cM1a) - Signed by Truitt Merle, MD on 03/21/2018    09/29/2017 Miscellaneous    Initial Biopsy MMR Results: MSI Stable      10/21/2017 Initial Diagnosis    Rectal cancer ([place])    10/28/2017 Imaging    CT CAP W Contrast  IMPRESSION: 1. Long segment circumferential wall thickening of the rectosigmoid colon consistent with known  adenocarcinoma. No evidence of bowel obstruction or perforation. 2. No definite signs of metastatic disease in the abdomen or pelvis. Soft tissue nodularity in the right pelvis is probably due to an unopacified pelvic vein. 3. Multiple pulmonary nodules, some cavitary. These are not typical of metastatic adenocarcinoma (especially without evidence of abdominal disease). Consider multicentric or metastatic lung cancer. PET-CT may be helpful to assess hypermetabolic activity and guided tissue sampling. 4. Multiple bladder calculi consistent with chronic bladder outlet obstruction from prostatomegaly. Nonobstructing right renal calculus. No hydronephrosis. 5. Aortic Atherosclerosis (ICD10-I70.0). Additional incidental findings including a moderate size hiatal hernia and renal cysts.     11/05/2017 - 12/18/2017 Radiation Therapy    Radiation with Dr. Lisbeth Renshaw With Xeloda    11/09/2017 - 12/18/2017 Chemotherapy    Chemoradiation with Xeloda '825mg'$ /m2 q12h, M-F on days of radiation plan to starting 11/09/17    11/11/2017 Imaging    PET Scan  IMPRESSION:  Wall thickening/ mass involving the upper rectum, corresponding to the patient's known rectal adenocarcinoma. Bilateral pulmonary nodules, partially cavitary in the left upper lobe nodule, suspicious for metastases. Notably, cavitary metastases are more common with squamous cell carcinoma rather than the patient's known adenocarcinoma.     12/25/2018 Procedure    12/25/2018 Sigmoidoscopy Impression: - Malignant tumor in the proximal rectum, in the mid rectum and in the recto-sigmoid colon. Biopsied.      12/25/2018 Pathology Results    Diagnosis 12/25/18 Rectum, biopsy -  ADENOCARCINOMA. Microscopic Comment    12/25/2018 Imaging    12/25/2018 CT AP IMPRESSION: 1. Wall thickening and enhancement of the rectum and distal sigmoid colon, consistent with known rectal carcinoma. 2. New hypoattenuating lesion in the right hepatic lobe,  concerning for metastatic disease. 3. Increased size of posterior right lung base mass, now measuring 5.4 x 3.8 cm, previously 1.8 x 2.0 cm. Multiple right lower lobe pulmonary nodules, worsened since 10/28/2017, and consistent with metastatic disease. 4. Multiple large calculi within the urinary bladder.    01/12/2019 Pathology Results    Diagnosis Lung, biopsy - METASTATIC ADENOCARCINOMA WITH NECROSIS, CONSISTENT WITH PATIENT'S CLINICAL HISTORY OF PRIMARY COLORECTAL CARCINOMA.     Chemotherapy    PENDING FOLFOX every 2 week starting next week    01/20/2019 Imaging    CT Chest Wo Contrast  IMPRESSION: Multiple bilateral pulmonary metastases, including index lesions as above, progressive from 2018.  Aortic Atherosclerosis (ICD10-I70.0) and Emphysema (ICD10-J43.9).     01/20/2019 -  Chemotherapy    First line FOLFOX every 2 weeks      CURRENT THERAPY  FOLFOX every 2 weeks x3, starting 01/20/2019  INTERVAL HISTORY: Adrian Banks is a 85 y.o. male who is here for follow-up. He is scheduled for a chest CT yesterday and to start FOLFOX. Today, he is here with his wife. He still has diarrhea and goes to the bathroom 5-6 times a day, but no recent hematochezia. He describes episodes of mild hemoptysis since his biopsy. He is able to take care of himself at home and is did well after his first IV iron.    Pertinent positives and negatives of review of systems are listed and detailed within the above HPI.  REVIEW OF SYSTEMS:   Constitutional: Denies fevers, chills or abnormal weight loss Eyes: Denies blurriness of vision Ears, nose, mouth, throat, and face: Denies mucositis or sore throat Respiratory: Denies dyspnea or wheezes, (+) hemoptysis  Cardiovascular: Denies palpitation, chest discomfort or lower extremity swelling Gastrointestinal:  Denies nausea, heartburn, (+) diarrhea  Skin: Denies abnormal skin rashes Lymphatics: Denies new lymphadenopathy or easy  bruising Neurological:Denies numbness, tingling or new weaknesses Behavioral/Psych: Mood is stable, no new changes  All other systems were reviewed with the patient and are negative.  MEDICAL HISTORY:  Past Medical History:  Diagnosis Date  . Cancer ([place]) 09/29/2017   Rectal cvancer  . High cholesterol     SURGICAL HISTORY: Past Surgical History:  Procedure Laterality Date  . BIOPSY  12/25/2018   Procedure: BIOPSY;  Surgeon: Wonda Horner, MD;  Location: [facility] ENDOSCOPY;  Service: Endoscopy;;  . FLEXIBLE SIGMOIDOSCOPY N/A 12/25/2018   Procedure: Beryle Quant;  Surgeon: Wonda Horner, MD;  Location: [facility] ENDOSCOPY;  Service: Endoscopy;  Laterality: N/A;  . IR IMAGING GUIDED PORT INSERTION  01/11/2019    I have reviewed the social history and family history with the patient and they are unchanged from previous note.  ALLERGIES:  has No Known Allergies.  MEDICATIONS:  Current Outpatient Medications  Medication Sig Dispense Refill  . diphenoxylate-atropine (LOMOTIL) 2.5-0.025 MG tablet Take 1-2 tablets by mouth 4 (four) times daily as needed for diarrhea or loose stools. 30 tablet 1  . finasteride (PROSCAR) 5 MG tablet Take 5 mg by mouth daily.     Marland Kitchen lidocaine-prilocaine (EMLA) cream Apply to affected area once 30 g 3  . lovastatin (MEVACOR) 40 MG tablet Take 40 mg by mouth daily.     . naproxen sodium (ALEVE) 220 MG tablet  Take 220 mg by mouth daily as needed (pain).    . ondansetron (ZOFRAN) 8 MG tablet Take 1 tablet (8 mg total) by mouth 2 (two) times daily as needed for refractory nausea / vomiting. Start on day 3 after chemotherapy. 30 tablet 1  . prochlorperazine (COMPAZINE) 10 MG tablet Take 1 tablet (10 mg total) by mouth every 6 (six) hours as needed (Nausea or vomiting). 30 tablet 1   No current facility-administered medications for this visit.     PHYSICAL EXAMINATION: ECOG PERFORMANCE STATUS: 1 - Symptomatic but completely ambulatory  Vitals:   01/20/19 1048   BP: (!) 142/84  Pulse: 77  Resp: 18  Temp: (!) 97.5 F (36.4 C)  SpO2: 100%   Filed Weights   01/20/19 1048  Weight: 155 lb 1.6 oz (70.4 kg)    GENERAL:alert, no distress and comfortable, (+) on a wheelchair  SKIN: skin color, texture, turgor are normal, no rashes or significant lesions EYES: normal, Conjunctiva are pink and non-injected, sclera clear OROPHARYNX:no exudate, no erythema and lips, buccal mucosa, and tongue normal  NECK: supple, thyroid normal size, non-tender, without nodularity LYMPH:  no palpable lymphadenopathy in the cervical, axillary or inguinal LUNGS: clear to auscultation and percussion with normal breathing effort HEART: regular rate & rhythm and no murmurs and no lower extremity edema ABDOMEN:abdomen soft, non-tender and normal bowel sounds Musculoskeletal:no cyanosis of digits and no clubbing  NEURO: alert & oriented x 3 with fluent speech, no focal motor/sensory deficits  LABORATORY DATA:  I have reviewed the data as listed CBC Latest Ref Rng & Units 01/20/2019 01/14/2019 01/11/2019  WBC 4.0 - 10.5 K/uL 9.7 7.9 9.3  Hemoglobin 13.0 - 17.0 g/dL 8.6(L) 7.7(L) 8.8(L)  Hematocrit 39.0 - 52.0 % 28.3(L) 24.8(L) 29.0(L)  Platelets 150 - 400 K/uL 272 313 325     CMP Latest Ref Rng & Units 01/14/2019 01/07/2019 01/05/2019  Glucose 70 - 99 mg/dL 98 100(H) 135(H)  BUN 8 - 23 mg/dL 18 16 20   Creatinine 0.61 - 1.24 mg/dL 0.94 1.08 1.14  Sodium 135 - 145 mmol/L 143 145 139  Potassium 3.5 - 5.1 mmol/L 3.6 4.0 3.6  Chloride 98 - 111 mmol/L 110 110 108  CO2 22 - 32 mmol/L 25 26 23   Calcium 8.9 - 10.3 mg/dL 8.8(L) 9.1 8.2(L)  Total Protein 6.5 - 8.1 g/dL 6.4(L) 6.4(L) 6.3(L)  Total Bilirubin 0.3 - 1.2 mg/dL 0.7 0.5 0.9  Alkaline Phos 38 - 126 U/L 64 65 51  AST 15 - 41 U/L 12(L) 12(L) 16  ALT 0 - 44 U/L 7 11 15       RADIOGRAPHIC STUDIES: I have personally reviewed the radiological images as listed and agreed with the findings in the report. Ct Chest Wo  Contrast  Result Date: 01/19/2019 CLINICAL DATA:  Rectal cancer with pulmonary metastases EXAM: CT CHEST WITHOUT CONTRAST TECHNIQUE: Multidetector CT imaging of the chest was performed following the standard protocol without IV contrast. COMPARISON:  Partial comparison to CT abdomen/pelvis dated 12/25/2018. PET-CT dated 11/11/2017. FINDINGS: Cardiovascular: The heart is normal in size. No pericardial effusion. No evidence of thoracic aortic aneurysm. Atherosclerotic calcifications of the aortic arch. Coronary atherosclerosis the LAD and left circumflex. Right chest port terminates at the cavoatrial junction. Mediastinum/Nodes: 19 mm short axis subcarinal node. Visualized thyroid is unremarkable. Lungs/Pleura: Multiple bilateral pulmonary metastases, approximately 15-20 in number, including: --3.7 x 2.4 cm cavitary mass in the posterior left upper lobe (series 7/image 29) --2.4 x 2.9 cm cavitary nodule in  the posterior right lower lobe (series 7/image 70) --4.4 x 5.8 cm mass in the posterior right lower lobe (series 7/image 88) Mild centrilobular emphysematous changes, upper lobe predominant. No focal consolidation. Mild subpleural reticulation/fibrosis in the lung bases. No pleural effusion or pneumothorax. Upper Abdomen: Visualized upper abdomen is better evaluated on recent CT, noting hepatic metastases. Musculoskeletal: Degenerative changes of the visualized thoracolumbar spine. IMPRESSION: Multiple bilateral pulmonary metastases, including index lesions as above, progressive from 2018. Aortic Atherosclerosis (ICD10-I70.0) and Emphysema (ICD10-J43.9). Electronically Signed   By: Julian Hy M.D.   On: 01/19/2019 17:33     01/20/2019 CT Chest Wo Contrast  IMPRESSION: Multiple bilateral pulmonary metastases, including index lesions as above, progressive from 2018.  Aortic Atherosclerosis (ICD10-I70.0) and Emphysema (ICD10-J43.9).   ASSESSMENT & PLAN:  Adrian Banks is a 85 y.o. male with  history of   1. Rectal Cancer, invasive adenocarcinoma,with lung and liver metastasis, cTxNxM1, stage IV -Diagnosed in 09/2017.His initial staging scans showedbilateral lung nodules, highly suspicious for lung metastasis.He declined biopsy of his lung lesions. He was treatedwith radiation and oral chemo Xeloda to his primary rectal cancer.I last saw him in 03/2018 and he lost f/u since then.His 12/25/18 rectal biopsy and 01/12/19 Lung biopsy has confirmed rectal caner recurrence with lung metastasis. I reviewed these in detail with patient and family.  -Recent CT chest also showed progressed bilateral lung metastasis, I discussed with patient. -I previously discussed his disease not curable at this stage, but still treatable.  The goal of therapy is to prolong his life and preserve his quality of life. -I recommend him to use start palliative chemotherapy, for disease control.  Due to his advanced age, I recommended dose reduced FOLFOX, and would likely change FOLFOX to 5-FU maintenance therapy after 3-4 months therapy if he has good response  -I have requested genomic testing [facility] one on his biopsy, to see if he would benefit from immunotherapy or targeted therapy - He started FOLFOX every 2 weeks x3, starting today 2/13 morning.  Lab reviewed, adequate for treatment.  We again reviewed the potential side effects, especially cold sensitivity, neutropenic fever, nausea management, etc.  He voiced good understanding. -Follow-up in 1 week for toxicity checkup.  2. Anemia, secondary to rectal bleeding and iron deficiency   -new since early 2019  -probably related to his rectal cancer bleeding -will check iron study periodically.  -Hemoglobin improved from 7.7 to 8.6 after one dose of IV Feraheme, will give second dose IV Feraheme today  3. Diarrhea, Rectal Bleeding  -Secondary to rectal cancer recurrence   -I discussed take miralax 2-3 times a day to help empty his colon. If he still  has frequent bowel movements afterward, he can take lomotil or imodium as needed up to 4-5 times a day.  -I discussed chemo treatment will improve symptoms   4.Goal of care discussion  -The patient understands the goal of care is palliative and his cancer is not curable  -he is full code now     Plan  - Labs reviewed, he will proceed with FOLFOX every 2 weeks x3, starting today, with dose reduction   -IV iron today  - Lab, flush and f/u in one week  -Lab, flush, f/u and chemo FOLFOX in 2 weeks  -FO pending     No problem-specific Assessment & Plan notes found for this encounter.   No orders of the defined types were placed in this encounter.  All questions were answered. The patient knows to  call the clinic with any problems, questions or concerns. No barriers to learning was detected. I spent 20 minutes counseling the patient face to face. The total time spent in the appointment was 25 minutes and more than 50% was on counseling and review of test results  I, Manson Allan am acting as scribe for Dr. Truitt Merle.  I have reviewed the above documentation for accuracy and completeness, and I agree with the above.     Truitt Merle, MD 01/20/2019

## 2019-01-19 ENCOUNTER — Ambulatory Visit (HOSPITAL_COMMUNITY)
Admission: RE | Admit: 2019-01-19 | Discharge: 2019-01-19 | Disposition: A | Discharge status: Home / Self Care | Payer: Medicare Other | Source: Ambulatory Visit | Attending: Hematology | Admitting: Hematology

## 2019-01-19 DIAGNOSIS — C20 Malignant neoplasm of rectum: Secondary | ICD-10-CM | POA: Insufficient documentation

## 2019-01-20 ENCOUNTER — Inpatient Hospital Stay: Payer: Medicare Other

## 2019-01-20 ENCOUNTER — Inpatient Hospital Stay (HOSPITAL_BASED_OUTPATIENT_CLINIC_OR_DEPARTMENT_OTHER): Payer: Medicare Other | Admitting: Hematology

## 2019-01-20 ENCOUNTER — Telehealth: Payer: Self-pay | Admitting: Hematology

## 2019-01-20 ENCOUNTER — Encounter: Payer: Self-pay | Admitting: Hematology

## 2019-01-20 VITALS — BP 142/84 | HR 77 | Temp 97.5°F | Resp 18 | Ht 70.0 in | Wt 155.1 lb

## 2019-01-20 DIAGNOSIS — C20 Malignant neoplasm of rectum: Secondary | ICD-10-CM | POA: Diagnosis not present

## 2019-01-20 DIAGNOSIS — Z9221 Personal history of antineoplastic chemotherapy: Secondary | ICD-10-CM

## 2019-01-20 DIAGNOSIS — R918 Other nonspecific abnormal finding of lung field: Secondary | ICD-10-CM | POA: Diagnosis not present

## 2019-01-20 DIAGNOSIS — Z923 Personal history of irradiation: Secondary | ICD-10-CM

## 2019-01-20 DIAGNOSIS — D500 Iron deficiency anemia secondary to blood loss (chronic): Secondary | ICD-10-CM

## 2019-01-20 DIAGNOSIS — Z5111 Encounter for antineoplastic chemotherapy: Secondary | ICD-10-CM | POA: Diagnosis not present

## 2019-01-20 DIAGNOSIS — C787 Secondary malignant neoplasm of liver and intrahepatic bile duct: Secondary | ICD-10-CM

## 2019-01-20 DIAGNOSIS — Z79899 Other long term (current) drug therapy: Secondary | ICD-10-CM

## 2019-01-20 DIAGNOSIS — D509 Iron deficiency anemia, unspecified: Secondary | ICD-10-CM | POA: Diagnosis not present

## 2019-01-20 DIAGNOSIS — Z95828 Presence of other vascular implants and grafts: Secondary | ICD-10-CM

## 2019-01-20 DIAGNOSIS — R197 Diarrhea, unspecified: Secondary | ICD-10-CM

## 2019-01-20 LAB — COMPREHENSIVE METABOLIC PANEL
ALBUMIN: 3.2 g/dL — AB (ref 3.5–5.0)
ALT: 9 U/L (ref 0–44)
AST: 16 U/L (ref 15–41)
Alkaline Phosphatase: 68 U/L (ref 38–126)
Anion gap: 9 (ref 5–15)
BUN: 17 mg/dL (ref 8–23)
CO2: 24 mmol/L (ref 22–32)
Calcium: 9 mg/dL (ref 8.9–10.3)
Chloride: 109 mmol/L (ref 98–111)
Creatinine, Ser: 0.94 mg/dL (ref 0.61–1.24)
GFR calc Af Amer: >60 mL/min (ref >60)
GFR calc non Af Amer: >60 mL/min (ref >60)
Glucose, Bld: 88 mg/dL (ref 70–99)
Potassium: 3.7 mmol/L (ref 3.5–5.1)
Sodium: 142 mmol/L (ref 135–145)
Total Bilirubin: 0.5 mg/dL (ref 0.3–1.2)
Total Protein: 6.4 g/dL — ABNORMAL LOW (ref 6.5–8.1)

## 2019-01-20 LAB — CBC WITH DIFFERENTIAL/PLATELET
Abs Immature Granulocytes: 0.05 10*3/uL (ref 0.00–0.07)
Basophils Absolute: 0.1 10*3/uL (ref 0.0–0.1)
Basophils Relative: 1 %
EOS ABS: 0.3 10*3/uL (ref 0.0–0.5)
Eosinophils Relative: 3 %
HCT: 28.3 % — ABNORMAL LOW (ref 39.0–52.0)
Hemoglobin: 8.6 g/dL — ABNORMAL LOW (ref 13.0–17.0)
Immature Granulocytes: 1 %
Lymphocytes Relative: 7 %
Lymphs Abs: 0.7 10*3/uL (ref 0.7–4.0)
MCH: 25.5 pg — ABNORMAL LOW (ref 26.0–34.0)
MCHC: 30.4 g/dL (ref 30.0–36.0)
MCV: 84 fL (ref 80.0–100.0)
Monocytes Absolute: 0.8 10*3/uL (ref 0.1–1.0)
Monocytes Relative: 8 %
NEUTROS ABS: 7.8 10*3/uL — AB (ref 1.7–7.7)
Neutrophils Relative %: 80 %
Platelets: 272 10*3/uL (ref 150–400)
RBC: 3.37 MIL/uL — ABNORMAL LOW (ref 4.22–5.81)
RDW: 19.9 % — AB (ref 11.5–15.5)
WBC: 9.7 10*3/uL (ref 4.0–10.5)
nRBC: 0 % (ref 0.0–0.2)

## 2019-01-20 MED ORDER — DEXTROSE 5 % IV SOLN
Freq: Once | INTRAVENOUS | Status: AC
  Administered 2019-01-20: 12:00:00 via INTRAVENOUS
  Filled 2019-01-20: qty 250

## 2019-01-20 MED ORDER — SODIUM CHLORIDE 0.9 % IV SOLN
2200.0000 mg/m2 | INTRAVENOUS | Status: DC
  Administered 2019-01-20: 4100 mg via INTRAVENOUS
  Filled 2019-01-20: qty 82

## 2019-01-20 MED ORDER — SODIUM CHLORIDE 0.9 % IV SOLN
510.0000 mg | Freq: Once | INTRAVENOUS | Status: AC
  Administered 2019-01-20: 510 mg via INTRAVENOUS
  Filled 2019-01-20: qty 17

## 2019-01-20 MED ORDER — DEXAMETHASONE SODIUM PHOSPHATE 10 MG/ML IJ SOLN
INTRAMUSCULAR | Status: AC
  Filled 2019-01-20: qty 1

## 2019-01-20 MED ORDER — LEUCOVORIN CALCIUM INJECTION 350 MG
400.0000 mg/m2 | Freq: Once | INTRAVENOUS | Status: AC
  Administered 2019-01-20: 748 mg via INTRAVENOUS
  Filled 2019-01-20: qty 37.4

## 2019-01-20 MED ORDER — PALONOSETRON HCL INJECTION 0.25 MG/5ML
0.2500 mg | Freq: Once | INTRAVENOUS | Status: AC
  Administered 2019-01-20: 0.25 mg via INTRAVENOUS

## 2019-01-20 MED ORDER — PALONOSETRON HCL INJECTION 0.25 MG/5ML
INTRAVENOUS | Status: AC
  Filled 2019-01-20: qty 5

## 2019-01-20 MED ORDER — SODIUM CHLORIDE 0.9 % IV SOLN
INTRAVENOUS | Status: DC
  Administered 2019-01-20: 11:00:00 via INTRAVENOUS
  Filled 2019-01-20 (×2): qty 250

## 2019-01-20 MED ORDER — SODIUM CHLORIDE 0.9% FLUSH
10.0000 mL | INTRAVENOUS | Status: DC | PRN
  Administered 2019-01-20: 10 mL via INTRAVENOUS
  Filled 2019-01-20: qty 10

## 2019-01-20 MED ORDER — OXALIPLATIN CHEMO INJECTION 100 MG/20ML
60.0000 mg/m2 | Freq: Once | INTRAVENOUS | Status: AC
  Administered 2019-01-20: 110 mg via INTRAVENOUS
  Filled 2019-01-20: qty 20

## 2019-01-20 MED ORDER — DEXAMETHASONE SODIUM PHOSPHATE 10 MG/ML IJ SOLN
10.0000 mg | Freq: Once | INTRAMUSCULAR | Status: AC
  Administered 2019-01-20: 10 mg via INTRAVENOUS

## 2019-01-20 NOTE — Telephone Encounter (Signed)
Scheduled appt per 2/13 los.  Called patient spouse and they are aware of appt date and time.

## 2019-01-20 NOTE — Patient Instructions (Addendum)
[facility] Discharge Instructions for Patients Receiving Chemotherapy  Today you received the following chemotherapy agents: Oxaliplatin (Eloxatin), Leucovorin, Fluorouracil (Adrucil, 5-FU)  To help prevent nausea and vomiting after your treatment, we encourage you to take your nausea medication as directed. Received Aloxi during treatment today-->Take your Compazine prescription (not Zofran) for the next 3 days as needed.    If you develop nausea and vomiting that is not controlled by your nausea medication, call the clinic.   BELOW ARE SYMPTOMS THAT SHOULD BE REPORTED IMMEDIATELY:  *FEVER GREATER THAN 100.5 F  *CHILLS WITH OR WITHOUT FEVER  NAUSEA AND VOMITING THAT IS NOT CONTROLLED WITH YOUR NAUSEA MEDICATION  *UNUSUAL SHORTNESS OF BREATH  *UNUSUAL BRUISING OR BLEEDING  TENDERNESS IN MOUTH AND THROAT WITH OR WITHOUT PRESENCE OF ULCERS  *URINARY PROBLEMS  *BOWEL PROBLEMS  UNUSUAL RASH Items with * indicate a potential emergency and should be followed up as soon as possible.  Feel free to call the clinic should you have any questions or concerns. The clinic phone number is [phone].  Please show the [place] at check-in to the Emergency Department and triage nurse.  [facility] Discharge Instructions for Patients receiving Home Portable Chemo Pump   **The bag should finish at 46 hours, 96 hours or 7 days. For example, if your pump is scheduled for 46 hours and it was put on at 4pm, it should finish at 2 pm the day it is scheduled to come off regardless of your appointment time.    Estimated time to finish   _________________________ (Have your nurse fill in)     ** if the display on your pump reads "Low Volume" and it is beeping, take the batteries out of the pump and come to the cancer center for it to be taken off.   **If the pump alarms go off prior to the pump reading "Low Volume" then call the [phone] and someone can  assist you.  **If the plunger comes out and the bag fluid is running out, please use your chemo spill kit to clean up the spill. Do not use paper towels or other house hold products.  ** If you have problems or questions regarding your pump, please call either the [phone] or the cancer center Monday-Friday 8:00am-4:30pm at [phone] and we will assist you.  If you are unable to get assistance then go to [facility] Emergency Room, ask the staff to contact the IV team for assistance.    Oxaliplatin Injection What is this medicine? OXALIPLATIN (ox AL i PLA tin) is a chemotherapy drug. It targets fast dividing cells, like cancer cells, and causes these cells to die. This medicine is used to treat cancers of the colon and rectum, and many other cancers. This medicine may be used for other purposes; ask your health care provider or pharmacist if you have questions. COMMON BRAND NAME(S): Eloxatin What should I tell my health care provider before I take this medicine? They need to know if you have any of these conditions: -kidney disease -an unusual or allergic reaction to oxaliplatin, other chemotherapy, other medicines, foods, dyes, or preservatives -pregnant or trying to get pregnant -breast-feeding How should I use this medicine? This drug is given as an infusion into a vein. It is administered in a hospital or clinic by a specially trained health care professional. Talk to your pediatrician regarding the use of this medicine in children. Special care may be needed. Overdosage: If you  think you have taken too much of this medicine contact a poison control center or emergency room at once. NOTE: This medicine is only for you. Do not share this medicine with others. What if I miss a dose? It is important not to miss a dose. Call your doctor or health care professional if you are unable to keep an appointment. What may interact with this medicine? -medicines to increase blood  counts like filgrastim, pegfilgrastim, sargramostim -probenecid -some antibiotics like amikacin, gentamicin, neomycin, polymyxin B, streptomycin, tobramycin -zalcitabine Talk to your doctor or health care professional before taking any of these medicines: -acetaminophen -aspirin -ibuprofen -ketoprofen -naproxen This list may not describe all possible interactions. Give your health care provider a list of all the medicines, herbs, non-prescription drugs, or dietary supplements you use. Also tell them if you smoke, drink alcohol, or use illegal drugs. Some items may interact with your medicine. What should I watch for while using this medicine? Your condition will be monitored carefully while you are receiving this medicine. You will need important blood work done while you are taking this medicine. This medicine can make you more sensitive to cold. Do not drink cold drinks or use ice. Cover exposed skin before coming in contact with cold temperatures or cold objects. When out in cold weather wear warm clothing and cover your mouth and nose to warm the air that goes into your lungs. Tell your doctor if you get sensitive to the cold. This drug may make you feel generally unwell. This is not uncommon, as chemotherapy can affect healthy cells as well as cancer cells. Report any side effects. Continue your course of treatment even though you feel ill unless your doctor tells you to stop. In some cases, you may be given additional medicines to help with side effects. Follow all directions for their use. Call your doctor or health care professional for advice if you get a fever, chills or sore throat, or other symptoms of a cold or flu. Do not treat yourself. This drug decreases your body's ability to fight infections. Try to avoid being around people who are sick. This medicine may increase your risk to bruise or bleed. Call your doctor or health care professional if you notice any unusual bleeding. Be  careful brushing and flossing your teeth or using a toothpick because you may get an infection or bleed more easily. If you have any dental work done, tell your dentist you are receiving this medicine. Avoid taking products that contain aspirin, acetaminophen, ibuprofen, naproxen, or ketoprofen unless instructed by your doctor. These medicines may hide a fever. Do not become pregnant while taking this medicine. Women should inform their doctor if they wish to become pregnant or think they might be pregnant. There is a potential for serious side effects to an unborn child. Talk to your health care professional or pharmacist for more information. Do not breast-feed an infant while taking this medicine. Call your doctor or health care professional if you get diarrhea. Do not treat yourself. What side effects may I notice from receiving this medicine? Side effects that you should report to your doctor or health care professional as soon as possible: -allergic reactions like skin rash, itching or hives, swelling of the face, lips, or tongue -low blood counts - This drug may decrease the number of white blood cells, red blood cells and platelets. You may be at increased risk for infections and bleeding. -signs of infection - fever or chills, cough, sore throat, pain  or difficulty passing urine -signs of decreased platelets or bleeding - bruising, pinpoint red spots on the skin, black, tarry stools, nosebleeds -signs of decreased red blood cells - unusually weak or tired, fainting spells, lightheadedness -breathing problems -chest pain, pressure -cough -diarrhea -jaw tightness -mouth sores -nausea and vomiting -pain, swelling, redness or irritation at the injection site -pain, tingling, numbness in the hands or feet -problems with balance, talking, walking -redness, blistering, peeling or loosening of the skin, including inside the mouth -trouble passing urine or change in the amount of urine Side  effects that usually do not require medical attention (report to your doctor or health care professional if they continue or are bothersome): -changes in vision -constipation -hair loss -loss of appetite -metallic taste in the mouth or changes in taste -stomach pain This list may not describe all possible side effects. Call your doctor for medical advice about side effects. You may report side effects to FDA at 1-800-FDA-1088. Where should I keep my medicine? This drug is given in a hospital or clinic and will not be stored at home. NOTE: This sheet is a summary. It may not cover all possible information. If you have questions about this medicine, talk to your doctor, pharmacist, or health care provider.  2019 Elsevier/Gold Standard (2008-06-20 17:22:47)  Leucovorin injection What is this medicine? LEUCOVORIN (loo koe VOR in) is used to prevent or treat the harmful effects of some medicines. This medicine is used to treat anemia caused by a low amount of folic acid in the body. It is also used with 5-fluorouracil (5-FU) to treat colon cancer. This medicine may be used for other purposes; ask your health care provider or pharmacist if you have questions. What should I tell my health care provider before I take this medicine? They need to know if you have any of these conditions: -anemia from low levels of vitamin B-12 in the blood -an unusual or allergic reaction to leucovorin, folic acid, other medicines, foods, dyes, or preservatives -pregnant or trying to get pregnant -breast-feeding How should I use this medicine? This medicine is for injection into a muscle or into a vein. It is given by a health care professional in a hospital or clinic setting. Talk to your pediatrician regarding the use of this medicine in children. Special care may be needed. Overdosage: If you think you have taken too much of this medicine contact a poison control center or emergency room at once. NOTE: This  medicine is only for you. Do not share this medicine with others. What if I miss a dose? This does not apply. What may interact with this medicine? -capecitabine -fluorouracil -phenobarbital -phenytoin -primidone -trimethoprim-sulfamethoxazole This list may not describe all possible interactions. Give your health care provider a list of all the medicines, herbs, non-prescription drugs, or dietary supplements you use. Also tell them if you smoke, drink alcohol, or use illegal drugs. Some items may interact with your medicine. What should I watch for while using this medicine? Your condition will be monitored carefully while you are receiving this medicine. This medicine may increase the side effects of 5-fluorouracil, 5-FU. Tell your doctor or health care professional if you have diarrhea or mouth sores that do not get better or that get worse. What side effects may I notice from receiving this medicine? Side effects that you should report to your doctor or health care professional as soon as possible: -allergic reactions like skin rash, itching or hives, swelling of the face, lips, or tongue -  breathing problems -fever, infection -mouth sores -unusual bleeding or bruising -unusually weak or tired Side effects that usually do not require medical attention (report to your doctor or health care professional if they continue or are bothersome): -constipation or diarrhea -loss of appetite -nausea, vomiting This list may not describe all possible side effects. Call your doctor for medical advice about side effects. You may report side effects to FDA at 1-800-FDA-1088. Where should I keep my medicine? This drug is given in a hospital or clinic and will not be stored at home. NOTE: This sheet is a summary. It may not cover all possible information. If you have questions about this medicine, talk to your doctor, pharmacist, or health care provider.  2019 Elsevier/Gold Standard (2008-05-30  16:50:29)  Fluorouracil, 5-FU injection What is this medicine? FLUOROURACIL, 5-FU (flure oh YOOR a sil) is a chemotherapy drug. It slows the growth of cancer cells. This medicine is used to treat many types of cancer like breast cancer, colon or rectal cancer, pancreatic cancer, and stomach cancer. This medicine may be used for other purposes; ask your health care provider or pharmacist if you have questions. COMMON BRAND NAME(S): Adrucil What should I tell my health care provider before I take this medicine? They need to know if you have any of these conditions: -blood disorders -dihydropyrimidine dehydrogenase (DPD) deficiency -infection (especially a virus infection such as chickenpox, cold sores, or herpes) -kidney disease -liver disease -malnourished, poor nutrition -recent or ongoing radiation therapy -an unusual or allergic reaction to fluorouracil, other chemotherapy, other medicines, foods, dyes, or preservatives -pregnant or trying to get pregnant -breast-feeding How should I use this medicine? This drug is given as an infusion or injection into a vein. It is administered in a hospital or clinic by a specially trained health care professional. Talk to your pediatrician regarding the use of this medicine in children. Special care may be needed. Overdosage: If you think you have taken too much of this medicine contact a poison control center or emergency room at once. NOTE: This medicine is only for you. Do not share this medicine with others. What if I miss a dose? It is important not to miss your dose. Call your doctor or health care professional if you are unable to keep an appointment. What may interact with this medicine? -allopurinol -cimetidine -dapsone -digoxin -hydroxyurea -leucovorin -levamisole -medicines for seizures like ethotoin, fosphenytoin, phenytoin -medicines to increase blood counts like filgrastim, pegfilgrastim, sargramostim -medicines that treat or  prevent blood clots like warfarin, enoxaparin, and dalteparin -methotrexate -metronidazole -pyrimethamine -some other chemotherapy drugs like busulfan, cisplatin, estramustine, vinblastine -trimethoprim -trimetrexate -vaccines Talk to your doctor or health care professional before taking any of these medicines: -acetaminophen -aspirin -ibuprofen -ketoprofen -naproxen This list may not describe all possible interactions. Give your health care provider a list of all the medicines, herbs, non-prescription drugs, or dietary supplements you use. Also tell them if you smoke, drink alcohol, or use illegal drugs. Some items may interact with your medicine. What should I watch for while using this medicine? Visit your doctor for checks on your progress. This drug may make you feel generally unwell. This is not uncommon, as chemotherapy can affect healthy cells as well as cancer cells. Report any side effects. Continue your course of treatment even though you feel ill unless your doctor tells you to stop. In some cases, you may be given additional medicines to help with side effects. Follow all directions for their use. Call your doctor or health  care professional for advice if you get a fever, chills or sore throat, or other symptoms of a cold or flu. Do not treat yourself. This drug decreases your body's ability to fight infections. Try to avoid being around people who are sick. This medicine may increase your risk to bruise or bleed. Call your doctor or health care professional if you notice any unusual bleeding. Be careful brushing and flossing your teeth or using a toothpick because you may get an infection or bleed more easily. If you have any dental work done, tell your dentist you are receiving this medicine. Avoid taking products that contain aspirin, acetaminophen, ibuprofen, naproxen, or ketoprofen unless instructed by your doctor. These medicines may hide a fever. Do not become pregnant while  taking this medicine. Women should inform their doctor if they wish to become pregnant or think they might be pregnant. There is a potential for serious side effects to an unborn child. Talk to your health care professional or pharmacist for more information. Do not breast-feed an infant while taking this medicine. Men should inform their doctor if they wish to father a child. This medicine may lower sperm counts. Do not treat diarrhea with over the counter products. Contact your doctor if you have diarrhea that lasts more than 2 days or if it is severe and watery. This medicine can make you more sensitive to the sun. Keep out of the sun. If you cannot avoid being in the sun, wear protective clothing and use sunscreen. Do not use sun lamps or tanning beds/booths. What side effects may I notice from receiving this medicine? Side effects that you should report to your doctor or health care professional as soon as possible: -allergic reactions like skin rash, itching or hives, swelling of the face, lips, or tongue -low blood counts - this medicine may decrease the number of white blood cells, red blood cells and platelets. You may be at increased risk for infections and bleeding. -signs of infection - fever or chills, cough, sore throat, pain or difficulty passing urine -signs of decreased platelets or bleeding - bruising, pinpoint red spots on the skin, black, tarry stools, blood in the urine -signs of decreased red blood cells - unusually weak or tired, fainting spells, lightheadedness -breathing problems -changes in vision -chest pain -mouth sores -nausea and vomiting -pain, swelling, redness at site where injected -pain, tingling, numbness in the hands or feet -redness, swelling, or sores on hands or feet -stomach pain -unusual bleeding Side effects that usually do not require medical attention (report to your doctor or health care professional if they continue or are bothersome): -changes in  finger or toe nails -diarrhea -dry or itchy skin -hair loss -headache -loss of appetite -sensitivity of eyes to the light -stomach upset -unusually teary eyes This list may not describe all possible side effects. Call your doctor for medical advice about side effects. You may report side effects to FDA at 1-800-FDA-1088. Where should I keep my medicine? This drug is given in a hospital or clinic and will not be stored at home. NOTE: This sheet is a summary. It may not cover all possible information. If you have questions about this medicine, talk to your doctor, pharmacist, or health care provider.  2019 Elsevier/Gold Standard (2008-03-29 13:53:16)  Ferumoxytol injection What is this medicine? FERUMOXYTOL is an iron complex. Iron is used to make healthy red blood cells, which carry oxygen and nutrients throughout the body. This medicine is used to treat iron deficiency anemia.  This medicine may be used for other purposes; ask your health care provider or pharmacist if you have questions. COMMON BRAND NAME(S): Feraheme What should I tell my health care provider before I take this medicine? They need to know if you have any of these conditions: -anemia not caused by low iron levels -high levels of iron in the blood -magnetic resonance imaging (MRI) test scheduled -an unusual or allergic reaction to iron, other medicines, foods, dyes, or preservatives -pregnant or trying to get pregnant -breast-feeding How should I use this medicine? This medicine is for injection into a vein. It is given by a health care professional in a hospital or clinic setting. Talk to your pediatrician regarding the use of this medicine in children. Special care may be needed. Overdosage: If you think you have taken too much of this medicine contact a poison control center or emergency room at once. NOTE: This medicine is only for you. Do not share this medicine with others. What if I miss a dose? It is  important not to miss your dose. Call your doctor or health care professional if you are unable to keep an appointment. What may interact with this medicine? This medicine may interact with the following medications: -other iron products This list may not describe all possible interactions. Give your health care provider a list of all the medicines, herbs, non-prescription drugs, or dietary supplements you use. Also tell them if you smoke, drink alcohol, or use illegal drugs. Some items may interact with your medicine. What should I watch for while using this medicine? Visit your doctor or healthcare professional regularly. Tell your doctor or healthcare professional if your symptoms do not start to get better or if they get worse. You may need blood work done while you are taking this medicine. You may need to follow a special diet. Talk to your doctor. Foods that contain iron include: whole grains/cereals, dried fruits, beans, or peas, leafy green vegetables, and organ meats (liver, kidney). What side effects may I notice from receiving this medicine? Side effects that you should report to your doctor or health care professional as soon as possible: -allergic reactions like skin rash, itching or hives, swelling of the face, lips, or tongue -breathing problems -changes in blood pressure -feeling faint or lightheaded, falls -fever or chills -flushing, sweating, or hot feelings -swelling of the ankles or feet Side effects that usually do not require medical attention (report to your doctor or health care professional if they continue or are bothersome): -diarrhea -headache -nausea, vomiting -stomach pain This list may not describe all possible side effects. Call your doctor for medical advice about side effects. You may report side effects to FDA at 1-800-FDA-1088. Where should I keep my medicine? This drug is given in a hospital or clinic and will not be stored at home. NOTE: This sheet is a  summary. It may not cover all possible information. If you have questions about this medicine, talk to your doctor, pharmacist, or health care provider.  2019 Elsevier/Gold Standard (2017-01-12 20:21:10)

## 2019-01-22 ENCOUNTER — Inpatient Hospital Stay: Payer: Medicare Other

## 2019-01-22 VITALS — BP 124/74 | HR 77 | Temp 98.4°F | Resp 18

## 2019-01-22 DIAGNOSIS — C20 Malignant neoplasm of rectum: Secondary | ICD-10-CM

## 2019-01-22 DIAGNOSIS — Z5111 Encounter for antineoplastic chemotherapy: Secondary | ICD-10-CM | POA: Diagnosis not present

## 2019-01-22 MED ORDER — HEPARIN SOD (PORK) LOCK FLUSH 100 UNIT/ML IV SOLN
500.0000 [IU] | Freq: Once | INTRAVENOUS | Status: AC | PRN
  Administered 2019-01-22: 500 [IU]
  Filled 2019-01-22: qty 5

## 2019-01-22 MED ORDER — SODIUM CHLORIDE 0.9% FLUSH
10.0000 mL | INTRAVENOUS | Status: DC | PRN
  Administered 2019-01-22: 10 mL
  Filled 2019-01-22: qty 10

## 2019-01-22 NOTE — Patient Instructions (Signed)
[facility] Discharge Instructions for Patients Receiving Chemotherapy  Today you received the completed 95fu. To help prevent nausea and vomiting after your treatment, we encourage you to take your nausea medication as prescribed.  If you develop nausea and vomiting that is not controlled by your nausea medication, call the clinic.   BELOW ARE SYMPTOMS THAT SHOULD BE REPORTED IMMEDIATELY:  *FEVER GREATER THAN 100.5 F  *CHILLS WITH OR WITHOUT FEVER  NAUSEA AND VOMITING THAT IS NOT CONTROLLED WITH YOUR NAUSEA MEDICATION  *UNUSUAL SHORTNESS OF BREATH  *UNUSUAL BRUISING OR BLEEDING  TENDERNESS IN MOUTH AND THROAT WITH OR WITHOUT PRESENCE OF ULCERS  *URINARY PROBLEMS  *BOWEL PROBLEMS  UNUSUAL RASH Items with * indicate a potential emergency and should be followed up as soon as possible.  Feel free to call the clinic should you have any questions or concerns. The clinic phone number is [phone].  Please show the [place] at check-in to the Emergency Department and triage nurse.

## 2019-01-24 NOTE — Progress Notes (Signed)
Adrian Banks   [phone] [phone]   Clinic Follow up Note   Patient Care Team: Wenda Low, MD as PCP - General (Internal Medicine)  Date of Service:  01/26/2019  CHIEF COMPLAINT: F/u on metastatic rectal cancer  SUMMARY OF ONCOLOGIC HISTORY: Oncology History   Cancer Staging Rectal cancer [facility]) Staging form: Colon and Rectum, AJCC 8th Edition - Clinical stage from 09/29/2017: Stage IVA (cTX, cNX, cM1a) - Signed by Truitt Merle, MD on 03/21/2018      Rectal cancer ([place])   09/29/2017 Procedure    Colonoscopy by Dr. Penelope Coop 09/29/17  Findings:  The perianal and digital rectal examination were normal.  A non-obstructing large mass was found in the proximal rectum and in the mid rectum.  The mass was circumferential.  This was biopsied with a cold forceps for histology.  Area was tattooed with an injection of [place] ink both distal and proximal to the mass.  There is about 4-5 cm of rectal mucosa distal to the mass that looks norma. This mass is about 7-8 cm in length.  A 10 mm polyp was found in the ascending colon. The polyp was sessile. the polyp was removed with a hot snare.  Resection and retrieval were complete.      09/29/2017 Initial Biopsy    FINAL DIAGNOSIS 09/29/17  1. LG Intestine - Ascending Colon Polyp:  TUBULAR ADENOMA 2. LG Intestine - Rectum, Bx: INVASIVE MODERATELY DIFFERENTIATED ADENOCARCINOMA. THE MSI TESTING IS PENDING AND THE RESULT WILL BE REPORTED AS AN ADDENDUM.    09/29/2017 Cancer Staging    Staging form: Colon and Rectum, AJCC 8th Edition - Clinical stage from 09/29/2017: Stage IVA (cTX, cNX, cM1a) - Signed by Truitt Merle, MD on 03/21/2018    09/29/2017 Miscellaneous    Initial Biopsy MMR Results: MSI Stable      10/21/2017 Initial Diagnosis    Rectal cancer ([place])    10/28/2017 Imaging    CT CAP W Contrast  IMPRESSION: 1. Long segment circumferential wall thickening of the rectosigmoid colon consistent with  known adenocarcinoma. No evidence of bowel obstruction or perforation. 2. No definite signs of metastatic disease in the abdomen or pelvis. Soft tissue nodularity in the right pelvis is probably due to an unopacified pelvic vein. 3. Multiple pulmonary nodules, some cavitary. These are not typical of metastatic adenocarcinoma (especially without evidence of abdominal disease). Consider multicentric or metastatic lung cancer. PET-CT may be helpful to assess hypermetabolic activity and guided tissue sampling. 4. Multiple bladder calculi consistent with chronic bladder outlet obstruction from prostatomegaly. Nonobstructing right renal calculus. No hydronephrosis. 5. Aortic Atherosclerosis (ICD10-I70.0). Additional incidental findings including a moderate size hiatal hernia and renal cysts.     11/05/2017 - 12/18/2017 Radiation Therapy    Radiation with Dr. Lisbeth Renshaw With Xeloda    11/09/2017 - 12/18/2017 Chemotherapy    Chemoradiation with Xeloda 854m/m2 q12h, M-F on days of radiation plan to starting 11/09/17    11/11/2017 Imaging    PET Scan  IMPRESSION:  Wall thickening/ mass involving the upper rectum, corresponding to the patient's known rectal adenocarcinoma. Bilateral pulmonary nodules, partially cavitary in the left upper lobe nodule, suspicious for metastases. Notably, cavitary metastases are more common with squamous cell carcinoma rather than the patient's known adenocarcinoma.     12/25/2018 Procedure    12/25/2018 Sigmoidoscopy Impression: - Malignant tumor in the proximal rectum, in the mid rectum and in the recto-sigmoid colon. Biopsied.      12/25/2018 Pathology Results  Diagnosis 12/25/18 Rectum, biopsy - ADENOCARCINOMA. Microscopic Comment    12/25/2018 Imaging    12/25/2018 CT AP IMPRESSION: 1. Wall thickening and enhancement of the rectum and distal sigmoid colon, consistent with known rectal carcinoma. 2. New hypoattenuating lesion in the right hepatic lobe,  concerning for metastatic disease. 3. Increased size of posterior right lung base mass, now measuring 5.4 x 3.8 cm, previously 1.8 x 2.0 cm. Multiple right lower lobe pulmonary nodules, worsened since 10/28/2017, and consistent with metastatic disease. 4. Multiple large calculi within the urinary bladder.    01/12/2019 Pathology Results    Diagnosis Lung, biopsy - METASTATIC ADENOCARCINOMA WITH NECROSIS, CONSISTENT WITH PATIENT'S CLINICAL HISTORY OF PRIMARY COLORECTAL CARCINOMA.    01/20/2019 Imaging    CT Chest Wo Contrast  IMPRESSION: Multiple bilateral pulmonary metastases, including index lesions as above, progressive from 2018.  Aortic Atherosclerosis (ICD10-I70.0) and Emphysema (ICD10-J43.9).     01/20/2019 -  Chemotherapy    First line FOLFOX every 2 weeks       CURRENT THERAPY:  First line FOLFOX with dose reduction every 2 weeks, starting 01/20/2019  INTERVAL HISTORY:  Adrian Banks is here for a follow up of ongoing treatment. He presents to the clinic today with wife and sister. He notes after cycle 1 pump d/c his diarrhea much improved. He notes he had bloody flakes in his stool. When he cough at times he notes blood in phlegm still. This has happened occasionally. He notes he still feels weak and balance is off, which he had before chemo. He denies cold sensitivity and notes he has tried to drink cold items after week 1.    REVIEW OF SYSTEMS:   Constitutional: Denies fevers, chills or abnormal weight loss Eyes: Denies blurriness of vision Ears, nose, mouth, throat, and face: Denies mucositis or sore throat (+) mild blood occasionally in phlegm  Respiratory: Denies cough, dyspnea or wheezes Cardiovascular: Denies palpitation, chest discomfort or lower extremity swelling Gastrointestinal:  Denies nausea, heartburn (+) Flakes of blood in stool Skin: Denies abnormal skin rashes Lymphatics: Denies new lymphadenopathy or easy bruising Neurological:Denies numbness,  tingling (+) general weakness and unstable balance, stable  Behavioral/Psych: Mood is stable, no new changes  All other systems were reviewed with the patient and are negative.  MEDICAL HISTORY:  Past Medical History:  Diagnosis Date  . Cancer ([place]) 09/29/2017   Rectal cvancer  . High cholesterol     SURGICAL HISTORY: Past Surgical History:  Procedure Laterality Date  . BIOPSY  12/25/2018   Procedure: BIOPSY;  Surgeon: Wonda Horner, MD;  Location: [facility] ENDOSCOPY;  Service: Endoscopy;;  . FLEXIBLE SIGMOIDOSCOPY N/A 12/25/2018   Procedure: Beryle Quant;  Surgeon: Wonda Horner, MD;  Location: [facility] ENDOSCOPY;  Service: Endoscopy;  Laterality: N/A;  . IR IMAGING GUIDED PORT INSERTION  01/11/2019    I have reviewed the social history and family history with the patient and they are unchanged from previous note.  ALLERGIES:  has No Known Allergies.  MEDICATIONS:  Current Outpatient Medications  Medication Sig Dispense Refill  . diphenoxylate-atropine (LOMOTIL) 2.5-0.025 MG tablet Take 1-2 tablets by mouth 4 (four) times daily as needed for diarrhea or loose stools. 30 tablet 1  . finasteride (PROSCAR) 5 MG tablet Take 5 mg by mouth daily.     Marland Kitchen lidocaine-prilocaine (EMLA) cream Apply to affected area once 30 g 3  . lovastatin (MEVACOR) 40 MG tablet Take 40 mg by mouth daily.     . naproxen sodium (ALEVE) 220  MG tablet Take 220 mg by mouth daily as needed (pain).    . ondansetron (ZOFRAN) 8 MG tablet Take 1 tablet (8 mg total) by mouth 2 (two) times daily as needed for refractory nausea / vomiting. Start on day 3 after chemotherapy. 30 tablet 1  . prochlorperazine (COMPAZINE) 10 MG tablet Take 1 tablet (10 mg total) by mouth every 6 (six) hours as needed (Nausea or vomiting). 30 tablet 1   No current facility-administered medications for this visit.     PHYSICAL EXAMINATION: ECOG PERFORMANCE STATUS: 2 - Symptomatic, <50% confined to bed  Vitals:   01/26/19 1425  BP: (!)  141/66  Pulse: 84  Resp: 18  Temp: 98.2 F (36.8 C)  SpO2: 100%   Filed Weights   01/26/19 1425  Weight: 154 lb 12.8 oz (70.2 kg)    GENERAL:alert, no distress and comfortable SKIN: skin color, texture, turgor are normal, no rashes or significant lesions EYES: normal, Conjunctiva are pink and non-injected, sclera clear OROPHARYNX:no exudate, no erythema and lips, buccal mucosa, and tongue normal  NECK: supple, thyroid normal size, non-tender, without nodularity LYMPH:  no palpable lymphadenopathy in the cervical, axillary or inguinal LUNGS: clear to auscultation and percussion with normal breathing effort HEART: regular rate & rhythm and no murmurs and no lower extremity edema ABDOMEN:abdomen soft, non-tender and normal bowel sounds Musculoskeletal:no cyanosis of digits and no clubbing  NEURO: alert & oriented x 3 with fluent speech, no focal motor/sensory deficits  LABORATORY DATA:  I have reviewed the data as listed CBC Latest Ref Rng & Units 01/26/2019 01/20/2019 01/14/2019  WBC 4.0 - 10.5 K/uL 7.2 9.7 7.9  Hemoglobin 13.0 - 17.0 g/dL 9.7(L) 8.6(L) 7.7(L)  Hematocrit 39.0 - 52.0 % 31.3(L) 28.3(L) 24.8(L)  Platelets 150 - 400 K/uL 212 272 313     CMP Latest Ref Rng & Units 01/26/2019 01/20/2019 01/14/2019  Glucose 70 - 99 mg/dL 104(H) 88 98  BUN 8 - 23 mg/dL 19 17 18   Creatinine 0.61 - 1.24 mg/dL 1.09 0.94 0.94  Sodium 135 - 145 mmol/L 144 142 143  Potassium 3.5 - 5.1 mmol/L 3.5 3.7 3.6  Chloride 98 - 111 mmol/L 111 109 110  CO2 22 - 32 mmol/L 25 24 25   Calcium 8.9 - 10.3 mg/dL 8.7(L) 9.0 8.8(L)  Total Protein 6.5 - 8.1 g/dL 6.5 6.4(L) 6.4(L)  Total Bilirubin 0.3 - 1.2 mg/dL 0.4 0.5 0.7  Alkaline Phos 38 - 126 U/L 65 68 64  AST 15 - 41 U/L 13(L) 16 12(L)  ALT 0 - 44 U/L 7 9 7       RADIOGRAPHIC STUDIES: I have personally reviewed the radiological images as listed and agreed with the findings in the report. No results found.   ASSESSMENT & PLAN:  Adrian Banks is a  85 y.o. male with   1. Rectal Cancer, invasive adenocarcinoma,with lung and liver metastasis, cTxNxM1, stage IV -Diagnosed in 09/2017.His initial staging scans showedbilateral lung nodules, highly suspicious for lung metastasis.He declined biopsy of his lung lesions. He was treatedwith radiation andoralchemoXelodato his primary rectal cancer.I last saw him in 03/2018 and he lost f/u since then.His 12/25/18 rectal biopsy and2/5/20 Lung biopsy has confirmedrectal caner recurrence with lungmetastasis.I reviewed these in detail with patient and family.  -He started FOLFOX every 2 weeks on 01/20/19.  Due to his advanced age, I recommended dose reduced FOLFOX, and would likely change FOLFOX to 5-FU maintenance therapy after3-4 months therapy if he has good response.  -He is tolerating  treatment moderately well with fatigue and very mild cold sensitivity. I advised him to test coldness with hand before drinking or ingesting cold substances.  -Labs reviewed, CBC shows Hg at 9.7, CMP WNL. I encouraged him to drink adequate water to avoid dehydration after chemo.  -F/u in 1 week with next cycle treatment.    2. Anemia,secondary to rectal bleeding andiron deficiency -new sinceearly 2019  -probably related to his rectal cancer bleeding -will check iron studyperiodically. -S/p 2 doses of IV iron recently  -Hemoglobin improved to 9.7 today (01/26/19)  3. Diarrhea, Rectal Bleeding  -Secondary to rectal cancer recurrence  -I previously advised him to take miralax 2-3 times a day to help empty his colon. If he still has frequent bowel movements afterward, he can take lomotil or imodium as needed up to 4-5 times a day.  -I discussed chemo treatment will improve symptoms -His diarrhea is overall controlled and he only occasionally sees blood flakes in his stool with bowel movements.   4. Hemoptysis  -mild, since lung mass biopsy, possible related to biopsy or bleeding from  tumor -Monitor clinically.  She knows to go to emergency room if she has significant hemoptysis  5.Goal of care discussion  -The patient understands the goal of care is palliativeand his cancer is not curable -he is full code now    Plan  - Labs reviewed, he tolerated first cycle FOLFOX well overall  -Lab, flush, f/u and FOLFOX next week   No problem-specific Assessment & Plan notes found for this encounter.   No orders of the defined types were placed in this encounter.  All questions were answered. The patient knows to call the clinic with any problems, questions or concerns. No barriers to learning was detected. I spent 15 minutes counseling the patient face to face. The total time spent in the appointment was 20 minutes and more than 50% was on counseling and review of test results     Truitt Merle, MD 01/26/2019   I, Joslyn Devon, am acting as scribe for Truitt Merle, MD.   I have reviewed the above documentation for accuracy and completeness, and I agree with the above.

## 2019-01-26 ENCOUNTER — Inpatient Hospital Stay (HOSPITAL_BASED_OUTPATIENT_CLINIC_OR_DEPARTMENT_OTHER): Payer: Medicare Other | Admitting: Hematology

## 2019-01-26 ENCOUNTER — Inpatient Hospital Stay: Payer: Medicare Other

## 2019-01-26 ENCOUNTER — Encounter: Payer: Self-pay | Admitting: Hematology

## 2019-01-26 VITALS — BP 141/66 | HR 84 | Temp 98.2°F | Resp 18 | Ht 70.0 in | Wt 154.8 lb

## 2019-01-26 DIAGNOSIS — Z9221 Personal history of antineoplastic chemotherapy: Secondary | ICD-10-CM

## 2019-01-26 DIAGNOSIS — Z79899 Other long term (current) drug therapy: Secondary | ICD-10-CM

## 2019-01-26 DIAGNOSIS — D509 Iron deficiency anemia, unspecified: Secondary | ICD-10-CM

## 2019-01-26 DIAGNOSIS — R197 Diarrhea, unspecified: Secondary | ICD-10-CM

## 2019-01-26 DIAGNOSIS — C787 Secondary malignant neoplasm of liver and intrahepatic bile duct: Secondary | ICD-10-CM | POA: Diagnosis not present

## 2019-01-26 DIAGNOSIS — R918 Other nonspecific abnormal finding of lung field: Secondary | ICD-10-CM | POA: Diagnosis not present

## 2019-01-26 DIAGNOSIS — C20 Malignant neoplasm of rectum: Secondary | ICD-10-CM

## 2019-01-26 DIAGNOSIS — D500 Iron deficiency anemia secondary to blood loss (chronic): Secondary | ICD-10-CM

## 2019-01-26 DIAGNOSIS — Z5111 Encounter for antineoplastic chemotherapy: Secondary | ICD-10-CM | POA: Diagnosis not present

## 2019-01-26 DIAGNOSIS — Z95828 Presence of other vascular implants and grafts: Secondary | ICD-10-CM | POA: Insufficient documentation

## 2019-01-26 DIAGNOSIS — Z923 Personal history of irradiation: Secondary | ICD-10-CM

## 2019-01-26 LAB — COMPREHENSIVE METABOLIC PANEL
ALT: 7 U/L (ref 0–44)
AST: 13 U/L — ABNORMAL LOW (ref 15–41)
Albumin: 3.2 g/dL — ABNORMAL LOW (ref 3.5–5.0)
Alkaline Phosphatase: 65 U/L (ref 38–126)
Anion gap: 8 (ref 5–15)
BUN: 19 mg/dL (ref 8–23)
CO2: 25 mmol/L (ref 22–32)
Calcium: 8.7 mg/dL — ABNORMAL LOW (ref 8.9–10.3)
Chloride: 111 mmol/L (ref 98–111)
Creatinine, Ser: 1.09 mg/dL (ref 0.61–1.24)
GFR calc Af Amer: >60 mL/min (ref >60)
GFR calc non Af Amer: >60 mL/min (ref >60)
Glucose, Bld: 104 mg/dL — ABNORMAL HIGH (ref 70–99)
POTASSIUM: 3.5 mmol/L (ref 3.5–5.1)
SODIUM: 144 mmol/L (ref 135–145)
Total Bilirubin: 0.4 mg/dL (ref 0.3–1.2)
Total Protein: 6.5 g/dL (ref 6.5–8.1)

## 2019-01-26 LAB — CBC WITH DIFFERENTIAL/PLATELET
Abs Immature Granulocytes: 0.12 10*3/uL — ABNORMAL HIGH (ref 0.00–0.07)
BASOS PCT: 1 %
Basophils Absolute: 0.1 10*3/uL (ref 0.0–0.1)
EOS ABS: 0.1 10*3/uL (ref 0.0–0.5)
Eosinophils Relative: 1 %
HCT: 31.3 % — ABNORMAL LOW (ref 39.0–52.0)
Hemoglobin: 9.7 g/dL — ABNORMAL LOW (ref 13.0–17.0)
Immature Granulocytes: 2 %
Lymphocytes Relative: 9 %
Lymphs Abs: 0.6 10*3/uL — ABNORMAL LOW (ref 0.7–4.0)
MCH: 26.7 pg (ref 26.0–34.0)
MCHC: 31 g/dL (ref 30.0–36.0)
MCV: 86.2 fL (ref 80.0–100.0)
Monocytes Absolute: 0.7 10*3/uL (ref 0.1–1.0)
Monocytes Relative: 9 %
Neutro Abs: 5.7 10*3/uL (ref 1.7–7.7)
Neutrophils Relative %: 78 %
PLATELETS: 212 10*3/uL (ref 150–400)
RBC: 3.63 MIL/uL — AB (ref 4.22–5.81)
RDW: 21.2 % — AB (ref 11.5–15.5)
WBC: 7.2 10*3/uL (ref 4.0–10.5)
nRBC: 0.3 % — ABNORMAL HIGH (ref 0.0–0.2)

## 2019-01-26 MED ORDER — SODIUM CHLORIDE 0.9% FLUSH
10.0000 mL | Freq: Once | INTRAVENOUS | Status: AC
  Administered 2019-01-26: 10 mL
  Filled 2019-01-26: qty 10

## 2019-01-26 MED ORDER — HEPARIN SOD (PORK) LOCK FLUSH 100 UNIT/ML IV SOLN
500.0000 [IU] | Freq: Once | INTRAVENOUS | Status: AC | PRN
  Administered 2019-01-26: 500 [IU]
  Filled 2019-01-26: qty 5

## 2019-01-26 NOTE — Progress Notes (Signed)
Order not received by pharmacy.  New order recommended.

## 2019-01-27 ENCOUNTER — Telehealth: Payer: Self-pay | Admitting: Hematology

## 2019-01-27 NOTE — Telephone Encounter (Signed)
No los per 2/19. °

## 2019-02-01 NOTE — Progress Notes (Signed)
[place]   [phone] [phone]   Clinic Follow up Note   Patient Care Team: Wenda Low, MD as PCP - General (Internal Medicine) 02/03/2019  CHIEF COMPLAINT: F/u on metastatic rectal cancer  SUMMARY OF ONCOLOGIC HISTORY: Oncology History   Cancer Staging Rectal cancer [facility]) Staging form: Colon and Rectum, AJCC 8th Edition - Clinical stage from 09/29/2017: Stage IVA (cTX, cNX, cM1a) - Signed by Truitt Merle, MD on 03/21/2018      Rectal cancer ([place])   09/29/2017 Procedure    Colonoscopy by Dr. Penelope Coop 09/29/17  Findings:  The perianal and digital rectal examination were normal.  A non-obstructing large mass was found in the proximal rectum and in the mid rectum.  The mass was circumferential.  This was biopsied with a cold forceps for histology.  Area was tattooed with an injection of [place] ink both distal and proximal to the mass.  There is about 4-5 cm of rectal mucosa distal to the mass that looks norma. This mass is about 7-8 cm in length.  A 10 mm polyp was found in the ascending colon. The polyp was sessile. the polyp was removed with a hot snare.  Resection and retrieval were complete.      09/29/2017 Initial Biopsy    FINAL DIAGNOSIS 09/29/17  1. LG Intestine - Ascending Colon Polyp:  TUBULAR ADENOMA 2. LG Intestine - Rectum, Bx: INVASIVE MODERATELY DIFFERENTIATED ADENOCARCINOMA. THE MSI TESTING IS PENDING AND THE RESULT WILL BE REPORTED AS AN ADDENDUM.    09/29/2017 Cancer Staging    Staging form: Colon and Rectum, AJCC 8th Edition - Clinical stage from 09/29/2017: Stage IVA (cTX, cNX, cM1a) - Signed by Truitt Merle, MD on 03/21/2018    09/29/2017 Miscellaneous    Initial Biopsy MMR Results: MSI Stable      10/21/2017 Initial Diagnosis    Rectal cancer ([place])    10/28/2017 Imaging    CT CAP W Contrast  IMPRESSION: 1. Long segment circumferential wall thickening of the rectosigmoid colon consistent with known  adenocarcinoma. No evidence of bowel obstruction or perforation. 2. No definite signs of metastatic disease in the abdomen or pelvis. Soft tissue nodularity in the right pelvis is probably due to an unopacified pelvic vein. 3. Multiple pulmonary nodules, some cavitary. These are not typical of metastatic adenocarcinoma (especially without evidence of abdominal disease). Consider multicentric or metastatic lung cancer. PET-CT may be helpful to assess hypermetabolic activity and guided tissue sampling. 4. Multiple bladder calculi consistent with chronic bladder outlet obstruction from prostatomegaly. Nonobstructing right renal calculus. No hydronephrosis. 5. Aortic Atherosclerosis (ICD10-I70.0). Additional incidental findings including a moderate size hiatal hernia and renal cysts.     11/05/2017 - 12/18/2017 Radiation Therapy    Radiation with Dr. Lisbeth Renshaw With Xeloda    11/09/2017 - 12/18/2017 Chemotherapy    Chemoradiation with Xeloda 879m/m2 q12h, M-F on days of radiation plan to starting 11/09/17    11/11/2017 Imaging    PET Scan  IMPRESSION:  Wall thickening/ mass involving the upper rectum, corresponding to the patient's known rectal adenocarcinoma. Bilateral pulmonary nodules, partially cavitary in the left upper lobe nodule, suspicious for metastases. Notably, cavitary metastases are more common with squamous cell carcinoma rather than the patient's known adenocarcinoma.     12/25/2018 Procedure    12/25/2018 Sigmoidoscopy Impression: - Malignant tumor in the proximal rectum, in the mid rectum and in the recto-sigmoid colon. Biopsied.      12/25/2018 Pathology Results    Diagnosis 12/25/18 Rectum, biopsy -  ADENOCARCINOMA. Microscopic Comment    12/25/2018 Imaging    12/25/2018 CT AP IMPRESSION: 1. Wall thickening and enhancement of the rectum and distal sigmoid colon, consistent with known rectal carcinoma. 2. New hypoattenuating lesion in the right hepatic lobe,  concerning for metastatic disease. 3. Increased size of posterior right lung base mass, now measuring 5.4 x 3.8 cm, previously 1.8 x 2.0 cm. Multiple right lower lobe pulmonary nodules, worsened since 10/28/2017, and consistent with metastatic disease. 4. Multiple large calculi within the urinary bladder.    01/12/2019 Pathology Results    Diagnosis Lung, biopsy - METASTATIC ADENOCARCINOMA WITH NECROSIS, CONSISTENT WITH PATIENT'S CLINICAL HISTORY OF PRIMARY COLORECTAL CARCINOMA.    01/20/2019 Imaging    CT Chest Wo Contrast  IMPRESSION: Multiple bilateral pulmonary metastases, including index lesions as above, progressive from 2018.  Aortic Atherosclerosis (ICD10-I70.0) and Emphysema (ICD10-J43.9).     01/20/2019 -  Chemotherapy    First line FOLFOX every 2 weeks      CURRENT THERAPY  First line FOLFOX  every 2 weeks, starting 01/20/2019  INTERVAL HISTORY: Adrian Banks is a 85 y.o. male who is here for follow-up. Today, he is here with his family members. He tolerated chemotherapy well and denies any new changes since starting treatment. However, he is still experiencing diarrhea but takes Lomotil as needed. His appetite is small and denies any bleeding   Pertinent positives and negatives of review of systems are listed and detailed within the above HPI.  REVIEW OF SYSTEMS:   Constitutional: Denies fevers, chills or abnormal weight loss, (+) small appetite  Eyes: Denies blurriness of vision Ears, nose, mouth, throat, and face: Denies mucositis or sore throat Respiratory: Denies cough, dyspnea or wheezes Cardiovascular: Denies palpitation, chest discomfort or lower extremity swelling Gastrointestinal:  Denies nausea, heartburn, (+) diarrhea  Skin: Denies abnormal skin rashes Lymphatics: Denies new lymphadenopathy or easy bruising Neurological:Denies numbness, tingling or new weaknesses Behavioral/Psych: Mood is stable, no new changes  All other systems were reviewed  with the patient and are negative.  MEDICAL HISTORY:  Past Medical History:  Diagnosis Date  . Cancer ([place]) 09/29/2017   Rectal cvancer  . High cholesterol     SURGICAL HISTORY: Past Surgical History:  Procedure Laterality Date  . BIOPSY  12/25/2018   Procedure: BIOPSY;  Surgeon: Wonda Horner, MD;  Location: [facility] ENDOSCOPY;  Service: Endoscopy;;  . FLEXIBLE SIGMOIDOSCOPY N/A 12/25/2018   Procedure: Beryle Quant;  Surgeon: Wonda Horner, MD;  Location: [facility] ENDOSCOPY;  Service: Endoscopy;  Laterality: N/A;  . IR IMAGING GUIDED PORT INSERTION  01/11/2019    I have reviewed the social history and family history with the patient and they are unchanged from previous note.  ALLERGIES:  has No Known Allergies.  MEDICATIONS:  Current Outpatient Medications  Medication Sig Dispense Refill  . diphenoxylate-atropine (LOMOTIL) 2.5-0.025 MG tablet Take 1-2 tablets by mouth 4 (four) times daily as needed for diarrhea or loose stools. 60 tablet 0  . finasteride (PROSCAR) 5 MG tablet Take 5 mg by mouth daily.     Marland Kitchen lidocaine-prilocaine (EMLA) cream Apply to affected area once 30 g 3  . lovastatin (MEVACOR) 40 MG tablet Take 40 mg by mouth daily.     . naproxen sodium (ALEVE) 220 MG tablet Take 220 mg by mouth daily as needed (pain).    . ondansetron (ZOFRAN) 8 MG tablet Take 1 tablet (8 mg total) by mouth 2 (two) times daily as needed for refractory nausea / vomiting. Start  on day 3 after chemotherapy. 30 tablet 1  . prochlorperazine (COMPAZINE) 10 MG tablet Take 1 tablet (10 mg total) by mouth every 6 (six) hours as needed (Nausea or vomiting). 30 tablet 1   No current facility-administered medications for this visit.    Facility-Administered Medications Ordered in Other Visits  Medication Dose Route Frequency Provider Last Rate Last Dose  . fluorouracil (ADRUCIL) 4,500 mg in sodium chloride 0.9 % 60 mL chemo infusion  2,400 mg/m2 (Treatment Plan Recorded) Intravenous 1 day or 1 dose  Truitt Merle, MD   4,500 mg at 02/03/19 1407    PHYSICAL EXAMINATION: ECOG PERFORMANCE STATUS: 1 - Symptomatic but completely ambulatory  Vitals:   02/03/19 0937  BP: 140/83  Pulse: 63  Resp: 18  Temp: 97.9 F (36.6 C)  SpO2: 100%   Filed Weights   02/03/19 0937  Weight: 155 lb (70.3 kg)    GENERAL:alert, no distress and comfortable, (+) on a wheelchair  SKIN: skin color, texture, turgor are normal, no rashes or significant lesions EYES: normal, Conjunctiva are pink and non-injected, sclera clear OROPHARYNX:no exudate, no erythema and lips, buccal mucosa, and tongue normal  NECK: supple, thyroid normal size, non-tender, without nodularity LYMPH:  no palpable lymphadenopathy in the cervical, axillary or inguinal LUNGS: clear to auscultation and percussion with normal breathing effort HEART: regular rate & rhythm and no murmurs and no lower extremity edema ABDOMEN:abdomen soft, non-tender and normal bowel sounds Musculoskeletal:no cyanosis of digits and no clubbing  NEURO: alert & oriented x 3 with fluent speech, no focal motor/sensory deficits  LABORATORY DATA:  I have reviewed the data as listed CBC Latest Ref Rng & Units 02/03/2019 01/26/2019 01/20/2019  WBC 4.0 - 10.5 K/uL 6.4 7.2 9.7  Hemoglobin 13.0 - 17.0 g/dL 10.4(L) 9.7(L) 8.6(L)  Hematocrit 39.0 - 52.0 % 33.7(L) 31.3(L) 28.3(L)  Platelets 150 - 400 K/uL 198 212 272     CMP Latest Ref Rng & Units 02/03/2019 01/26/2019 01/20/2019  Glucose 70 - 99 mg/dL 97 104(H) 88  BUN 8 - 23 mg/dL _0 Creatinine 0.61 - 1.24 mg/dL 0.85 1.09 0.94  Sodium 135 - 145 mmol/L 143 144 142  Potassium 3.5 - 5.1 mmol/L 3.6 3.5 3.7  Chloride 98 - 111 mmol/L 110 111 109  CO2 22 - 32 mmol/L _1 Calcium 8.9 - 10.3 mg/dL 8.6(L) 8.7(L) 9.0  Total Protein 6.5 - 8.1 g/dL 6.3(L) 6.5 6.4(L)  Total Bilirubin 0.3 - 1.2 mg/dL 0.6 0.4 0.5  Alkaline Phos 38 - 126 U/L 68 65 68  AST 15 - 41 U/L 17 13(L) 16  ALT 0 - 44 U/L _2 RADIOGRAPHIC STUDIES: I have personally reviewed the radiological images as listed and agreed with the findings in the report. No results found.   ASSESSMENT & PLAN:  Adrian Banks is a 85 y.o. male with history of  1. Rectal Cancer, invasive adenocarcinoma,with lung and liver metastasis, cTxNxM1, stage IV -Diagnosed in 09/2017.His initial staging scans showedbilateral lung nodules, highly suspicious for lung metastasis.He declined biopsy of his lung lesions. He was treatedwith radiation andoralchemoXelodato his primary rectal cancer.I last saw him in 03/2018 and he lost f/u since then.His 12/25/18 rectal biopsy and2/5/20 Lung biopsy has confirmedrectal caner recurrence with lungmetastasis. -He started FOLFOX every 2 weeks on 01/20/19. Due to his advanced age, I reduced FOLFOX dose for first cycle,he tolerated well -Lab reviewed, adequate for treatment.  I will increase 5-FU  to 2400 mg/m2 today, and keep the oxaliplatin dose at 74m/m2, to see if he tolerates well -f/u and treatment in 2 weeks -restaging scan after 5-6 cycles chemo   2. Anemia,secondary to rectal bleeding andiron deficiency -new sinceearly 2019  -probably related to his rectal cancer bleeding -will check iron studyperiodically. -S/p 2 doses of IV iron recently, he responded well, hemoglobin improved   3. Diarrhea, Rectal Bleeding  -Secondary to rectal cancer recurrence  -I previously advised him to take miralax 2-3 times a day to help empty his colon. If he still has frequent bowel movements afterward, he can take lomotil or imodium as needed up to 4-5 times a day.  -His diarrhea has improved, no more rectal bleeding lately.  4. Hemoptysis  -mild, since lung mass biopsy, possible related to biopsy or bleeding from tumor -Monitor clinically.    5.Goal of care discussion  -The patient understands the goal of care is palliativeand his cancer is not curable -he is full code  now    Plan - I refilled Lomotil  - Labs reviewed, he is adequate to have second cycle FOLFOX today with dose increase of 5-FU -Lab, flush, f/u and FOLFOX on 02/18/2019    No problem-specific Assessment & Plan notes found for this encounter.   No orders of the defined types were placed in this encounter.  All questions were answered. The patient knows to call the clinic with any problems, questions or concerns. No barriers to learning was detected. I spent 20 minutes counseling the patient face to face. The total time spent in the appointment was 25 minutes and more than 50% was on counseling and review of test results  I, DManson Allanam acting as scribe for Dr. YTruitt Merle  I have reviewed the above documentation for accuracy and completeness, and I agree with the above.     YTruitt Merle MD 02/03/2019

## 2019-02-03 ENCOUNTER — Inpatient Hospital Stay: Payer: Medicare Other

## 2019-02-03 ENCOUNTER — Inpatient Hospital Stay: Payer: Medicare Other | Admitting: Hematology

## 2019-02-03 ENCOUNTER — Other Ambulatory Visit: Payer: Medicare Other

## 2019-02-03 ENCOUNTER — Encounter: Payer: Self-pay | Admitting: Hematology

## 2019-02-03 ENCOUNTER — Ambulatory Visit: Payer: Medicare Other | Admitting: Medical

## 2019-02-03 ENCOUNTER — Telehealth: Payer: Self-pay | Admitting: Hematology

## 2019-02-03 VITALS — BP 140/83 | HR 63 | Temp 97.9°F | Resp 18 | Ht 70.0 in | Wt 155.0 lb

## 2019-02-03 DIAGNOSIS — C20 Malignant neoplasm of rectum: Secondary | ICD-10-CM | POA: Diagnosis not present

## 2019-02-03 DIAGNOSIS — D509 Iron deficiency anemia, unspecified: Secondary | ICD-10-CM

## 2019-02-03 DIAGNOSIS — K922 Gastrointestinal hemorrhage, unspecified: Secondary | ICD-10-CM

## 2019-02-03 DIAGNOSIS — C787 Secondary malignant neoplasm of liver and intrahepatic bile duct: Secondary | ICD-10-CM | POA: Diagnosis not present

## 2019-02-03 DIAGNOSIS — D500 Iron deficiency anemia secondary to blood loss (chronic): Secondary | ICD-10-CM

## 2019-02-03 DIAGNOSIS — R918 Other nonspecific abnormal finding of lung field: Secondary | ICD-10-CM

## 2019-02-03 DIAGNOSIS — Z95828 Presence of other vascular implants and grafts: Secondary | ICD-10-CM

## 2019-02-03 DIAGNOSIS — R197 Diarrhea, unspecified: Secondary | ICD-10-CM

## 2019-02-03 DIAGNOSIS — Z923 Personal history of irradiation: Secondary | ICD-10-CM

## 2019-02-03 DIAGNOSIS — Z79899 Other long term (current) drug therapy: Secondary | ICD-10-CM

## 2019-02-03 DIAGNOSIS — Z5111 Encounter for antineoplastic chemotherapy: Secondary | ICD-10-CM | POA: Diagnosis not present

## 2019-02-03 DIAGNOSIS — Z9221 Personal history of antineoplastic chemotherapy: Secondary | ICD-10-CM

## 2019-02-03 LAB — CBC WITH DIFFERENTIAL/PLATELET
Abs Immature Granulocytes: 0.01 10*3/uL (ref 0.00–0.07)
BASOS PCT: 1 %
Basophils Absolute: 0.1 10*3/uL (ref 0.0–0.1)
Eosinophils Absolute: 0.2 10*3/uL (ref 0.0–0.5)
Eosinophils Relative: 3 %
HCT: 33.7 % — ABNORMAL LOW (ref 39.0–52.0)
Hemoglobin: 10.4 g/dL — ABNORMAL LOW (ref 13.0–17.0)
Immature Granulocytes: 0 %
Lymphocytes Relative: 8 %
Lymphs Abs: 0.5 10*3/uL — ABNORMAL LOW (ref 0.7–4.0)
MCH: 27.2 pg (ref 26.0–34.0)
MCHC: 30.9 g/dL (ref 30.0–36.0)
MCV: 88 fL (ref 80.0–100.0)
MONOS PCT: 10 %
Monocytes Absolute: 0.6 10*3/uL (ref 0.1–1.0)
Neutro Abs: 5.1 10*3/uL (ref 1.7–7.7)
Neutrophils Relative %: 78 %
Platelets: 198 10*3/uL (ref 150–400)
RBC: 3.83 MIL/uL — ABNORMAL LOW (ref 4.22–5.81)
RDW: 23.1 % — ABNORMAL HIGH (ref 11.5–15.5)
WBC: 6.4 10*3/uL (ref 4.0–10.5)
nRBC: 0 % (ref 0.0–0.2)

## 2019-02-03 LAB — COMPREHENSIVE METABOLIC PANEL
ALT: 11 U/L (ref 0–44)
ANION GAP: 9 (ref 5–15)
AST: 17 U/L (ref 15–41)
Albumin: 3.3 g/dL — ABNORMAL LOW (ref 3.5–5.0)
Alkaline Phosphatase: 68 U/L (ref 38–126)
BUN: 15 mg/dL (ref 8–23)
CO2: 24 mmol/L (ref 22–32)
Calcium: 8.6 mg/dL — ABNORMAL LOW (ref 8.9–10.3)
Chloride: 110 mmol/L (ref 98–111)
Creatinine, Ser: 0.85 mg/dL (ref 0.61–1.24)
GFR calc Af Amer: >60 mL/min (ref >60)
GFR calc non Af Amer: >60 mL/min (ref >60)
GLUCOSE: 97 mg/dL (ref 70–99)
Potassium: 3.6 mmol/L (ref 3.5–5.1)
Sodium: 143 mmol/L (ref 135–145)
Total Bilirubin: 0.6 mg/dL (ref 0.3–1.2)
Total Protein: 6.3 g/dL — ABNORMAL LOW (ref 6.5–8.1)

## 2019-02-03 LAB — IRON AND TIBC
Iron: 51 ug/dL (ref 42–163)
Saturation Ratios: 22 % (ref 20–55)
TIBC: 231 ug/dL (ref 202–409)
UIBC: 181 ug/dL (ref 117–376)

## 2019-02-03 LAB — CEA (IN HOUSE-CHCC): CEA (CHCC-In House): 64.28 ng/mL — ABNORMAL HIGH (ref 0.00–5.00)

## 2019-02-03 LAB — FERRITIN: Ferritin: 263 ng/mL (ref 24–336)

## 2019-02-03 MED ORDER — SODIUM CHLORIDE 0.9% FLUSH
10.0000 mL | Freq: Once | INTRAVENOUS | Status: AC
  Administered 2019-02-03: 10 mL
  Filled 2019-02-03: qty 10

## 2019-02-03 MED ORDER — PALONOSETRON HCL INJECTION 0.25 MG/5ML
INTRAVENOUS | Status: AC
  Filled 2019-02-03: qty 5

## 2019-02-03 MED ORDER — DEXAMETHASONE SODIUM PHOSPHATE 10 MG/ML IJ SOLN
INTRAMUSCULAR | Status: AC
  Filled 2019-02-03: qty 1

## 2019-02-03 MED ORDER — DEXAMETHASONE SODIUM PHOSPHATE 10 MG/ML IJ SOLN
10.0000 mg | Freq: Once | INTRAMUSCULAR | Status: AC
  Administered 2019-02-03: 10 mg via INTRAVENOUS

## 2019-02-03 MED ORDER — DIPHENOXYLATE-ATROPINE 2.5-0.025 MG PO TABS: 1.0000 | ORAL_TABLET | Freq: Four times a day (QID) | ORAL | 0 refills | Status: DC | PRN

## 2019-02-03 MED ORDER — LEUCOVORIN CALCIUM INJECTION 350 MG
400.0000 mg/m2 | Freq: Once | INTRAVENOUS | Status: AC
  Administered 2019-02-03: 748 mg via INTRAVENOUS
  Filled 2019-02-03: qty 37.4

## 2019-02-03 MED ORDER — DEXTROSE 5 % IV SOLN
Freq: Once | INTRAVENOUS | Status: AC
  Administered 2019-02-03: 11:00:00 via INTRAVENOUS
  Filled 2019-02-03: qty 250

## 2019-02-03 MED ORDER — SODIUM CHLORIDE 0.9 % IV SOLN
2400.0000 mg/m2 | INTRAVENOUS | Status: DC
  Administered 2019-02-03: 4500 mg via INTRAVENOUS
  Filled 2019-02-03: qty 90

## 2019-02-03 MED ORDER — OXALIPLATIN CHEMO INJECTION 100 MG/20ML
60.0000 mg/m2 | Freq: Once | INTRAVENOUS | Status: AC
  Administered 2019-02-03: 110 mg via INTRAVENOUS
  Filled 2019-02-03: qty 22

## 2019-02-03 MED ORDER — PALONOSETRON HCL INJECTION 0.25 MG/5ML
0.2500 mg | Freq: Once | INTRAVENOUS | Status: AC
  Administered 2019-02-03: 0.25 mg via INTRAVENOUS

## 2019-02-03 NOTE — Telephone Encounter (Signed)
No los per 2/27.

## 2019-02-03 NOTE — Patient Instructions (Signed)
[facility] Discharge Instructions for Patients Receiving Chemotherapy  Today you received the following chemotherapy agents Oxaliplatin, Leucovorin, Fluorouracil.   To help prevent nausea and vomiting after your treatment, we encourage you to take your nausea medication as directed.  If you develop nausea and vomiting that is not controlled by your nausea medication, call the clinic.   BELOW ARE SYMPTOMS THAT SHOULD BE REPORTED IMMEDIATELY:  *FEVER GREATER THAN 100.5 F  *CHILLS WITH OR WITHOUT FEVER  NAUSEA AND VOMITING THAT IS NOT CONTROLLED WITH YOUR NAUSEA MEDICATION  *UNUSUAL SHORTNESS OF BREATH  *UNUSUAL BRUISING OR BLEEDING  TENDERNESS IN MOUTH AND THROAT WITH OR WITHOUT PRESENCE OF ULCERS  *URINARY PROBLEMS  *BOWEL PROBLEMS  UNUSUAL RASH Items with * indicate a potential emergency and should be followed up as soon as possible.  Feel free to call the clinic should you have any questions or concerns. The clinic phone number is [phone].  Please show the CHEMO ALERT CARD at check-in to the Emergency Department and triage nurse.   

## 2019-02-05 ENCOUNTER — Inpatient Hospital Stay: Payer: Medicare Other

## 2019-02-05 VITALS — BP 149/79 | HR 70 | Temp 98.7°F | Resp 18

## 2019-02-05 DIAGNOSIS — C20 Malignant neoplasm of rectum: Secondary | ICD-10-CM

## 2019-02-05 DIAGNOSIS — Z5111 Encounter for antineoplastic chemotherapy: Secondary | ICD-10-CM | POA: Diagnosis not present

## 2019-02-05 MED ORDER — SODIUM CHLORIDE 0.9% FLUSH
10.0000 mL | INTRAVENOUS | Status: DC | PRN
  Administered 2019-02-05: 10 mL
  Filled 2019-02-05: qty 10

## 2019-02-05 MED ORDER — HEPARIN SOD (PORK) LOCK FLUSH 100 UNIT/ML IV SOLN
500.0000 [IU] | Freq: Once | INTRAVENOUS | Status: AC | PRN
  Administered 2019-02-05: 500 [IU]
  Filled 2019-02-05: qty 5

## 2019-02-05 NOTE — Patient Instructions (Signed)

## 2019-02-15 NOTE — Progress Notes (Signed)
[place]   [phone]) [phone] [phone]) [phone]   Clinic Follow up Note   Patient Care Team: Wenda Low, MD as PCP - General (Internal Medicine) 02/17/2019  CHIEF COMPLAINT: F/u on metastatic rectal cancer   SUMMARY OF ONCOLOGIC HISTORY: Oncology History   Cancer Staging Rectal cancer [facility]) Staging form: Colon and Rectum, AJCC 8th Edition - Clinical stage from 09/29/2017: Stage IVA (cTX, cNX, cM1a) - Signed by Truitt Merle, MD on 03/21/2018      Rectal cancer ([place])   09/29/2017 Procedure    Colonoscopy by Dr. Penelope Coop 09/29/17  Findings:  The perianal and digital rectal examination were normal.  A non-obstructing large mass was found in the proximal rectum and in the mid rectum.  The mass was circumferential.  This was biopsied with a cold forceps for histology.  Area was tattooed with an injection of [place] ink both distal and proximal to the mass.  There is about 4-5 cm of rectal mucosa distal to the mass that looks norma. This mass is about 7-8 cm in length.  A 10 mm polyp was found in the ascending colon. The polyp was sessile. the polyp was removed with a hot snare.  Resection and retrieval were complete.      09/29/2017 Initial Biopsy    FINAL DIAGNOSIS 09/29/17  1. LG Intestine - Ascending Colon Polyp:  TUBULAR ADENOMA 2. LG Intestine - Rectum, Bx: INVASIVE MODERATELY DIFFERENTIATED ADENOCARCINOMA. THE MSI TESTING IS PENDING AND THE RESULT WILL BE REPORTED AS AN ADDENDUM.    09/29/2017 Cancer Staging    Staging form: Colon and Rectum, AJCC 8th Edition - Clinical stage from 09/29/2017: Stage IVA (cTX, cNX, cM1a) - Signed by Truitt Merle, MD on 03/21/2018    09/29/2017 Miscellaneous    Initial Biopsy MMR Results: MSI Stable      10/21/2017 Initial Diagnosis    Rectal cancer ([place])    10/28/2017 Imaging    CT CAP W Contrast  IMPRESSION: 1. Long segment circumferential wall thickening of the rectosigmoid colon consistent with known  adenocarcinoma. No evidence of bowel obstruction or perforation. 2. No definite signs of metastatic disease in the abdomen or pelvis. Soft tissue nodularity in the right pelvis is probably due to an unopacified pelvic vein. 3. Multiple pulmonary nodules, some cavitary. These are not typical of metastatic adenocarcinoma (especially without evidence of abdominal disease). Consider multicentric or metastatic lung cancer. PET-CT may be helpful to assess hypermetabolic activity and guided tissue sampling. 4. Multiple bladder calculi consistent with chronic bladder outlet obstruction from prostatomegaly. Nonobstructing right renal calculus. No hydronephrosis. 5. Aortic Atherosclerosis (ICD10-I70.0). Additional incidental findings including a moderate size hiatal hernia and renal cysts.     11/05/2017 - 12/18/2017 Radiation Therapy    Radiation with Dr. Lisbeth Renshaw With Xeloda    11/09/2017 - 12/18/2017 Chemotherapy    Chemoradiation with Xeloda 814m/m2 q12h, M-F on days of radiation plan to starting 11/09/17    11/11/2017 Imaging    PET Scan  IMPRESSION:  Wall thickening/ mass involving the upper rectum, corresponding to the patient's known rectal adenocarcinoma. Bilateral pulmonary nodules, partially cavitary in the left upper lobe nodule, suspicious for metastases. Notably, cavitary metastases are more common with squamous cell carcinoma rather than the patient's known adenocarcinoma.     12/25/2018 Procedure    12/25/2018 Sigmoidoscopy Impression: - Malignant tumor in the proximal rectum, in the mid rectum and in the recto-sigmoid colon. Biopsied.      12/25/2018 Pathology Results    Diagnosis 12/25/18 Rectum,  biopsy - ADENOCARCINOMA. Microscopic Comment    12/25/2018 Imaging    12/25/2018 CT AP IMPRESSION: 1. Wall thickening and enhancement of the rectum and distal sigmoid colon, consistent with known rectal carcinoma. 2. New hypoattenuating lesion in the right hepatic lobe,  concerning for metastatic disease. 3. Increased size of posterior right lung base mass, now measuring 5.4 x 3.8 cm, previously 1.8 x 2.0 cm. Multiple right lower lobe pulmonary nodules, worsened since 10/28/2017, and consistent with metastatic disease. 4. Multiple large calculi within the urinary bladder.    01/12/2019 Pathology Results    Diagnosis Lung, biopsy - METASTATIC ADENOCARCINOMA WITH NECROSIS, CONSISTENT WITH PATIENT'S CLINICAL HISTORY OF PRIMARY COLORECTAL CARCINOMA.    01/20/2019 Imaging    CT Chest Wo Contrast  IMPRESSION: Multiple bilateral pulmonary metastases, including index lesions as above, progressive from 2018.  Aortic Atherosclerosis (ICD10-I70.0) and Emphysema (ICD10-J43.9).     01/20/2019 -  Chemotherapy    First line FOLFOX every 2 weeks      CURRENT THERAPY:  First lineFOLFOX every 2 weeks, starting 01/20/2019  INTERVAL HISTORY: Adrian Banks is a 85 y.o. male who is here for follow-up. Today, he is accompanied by his wife and daughter to the clinic.  He came in with a wheelchair as usual.  He tolerated his last cycle chemo moderately well, with some fatigue, and low appetite, and here has recovered well.  His diarrhea has improved, no hematochezia.  He lost about 2 pounds since last visit 2 weeks ago.  He denies significant pain or other discomfort.  Pertinent positives and negatives of review of systems are listed and detailed within the above HPI.  REVIEW OF SYSTEMS:   Constitutional: Denies fevers, chills or abnormal weight loss Eyes: Denies blurriness of vision Ears, nose, mouth, throat, and face: Denies mucositis or sore throat Respiratory: Denies cough, dyspnea or wheezes Cardiovascular: Denies palpitation, chest discomfort or lower extremity swelling Gastrointestinal:  Denies nausea, heartburn or change in bowel habits Skin: Denies abnormal skin rashes Lymphatics: Denies new lymphadenopathy or easy bruising Neurological:Denies numbness,  tingling or new weaknesses Behavioral/Psych: Mood is stable, no new changes  All other systems were reviewed with the patient and are negative.  MEDICAL HISTORY:  Past Medical History:  Diagnosis Date  . Cancer ([place]) 09/29/2017   Rectal cvancer  . High cholesterol     SURGICAL HISTORY: Past Surgical History:  Procedure Laterality Date  . BIOPSY  12/25/2018   Procedure: BIOPSY;  Surgeon: Wonda Horner, MD;  Location: [facility] ENDOSCOPY;  Service: Endoscopy;;  . FLEXIBLE SIGMOIDOSCOPY N/A 12/25/2018   Procedure: Beryle Quant;  Surgeon: Wonda Horner, MD;  Location: [facility] ENDOSCOPY;  Service: Endoscopy;  Laterality: N/A;  . IR IMAGING GUIDED PORT INSERTION  01/11/2019    I have reviewed the social history and family history with the patient and they are unchanged from previous note.  ALLERGIES:  has No Known Allergies.  MEDICATIONS:  Current Outpatient Medications  Medication Sig Dispense Refill  . diphenoxylate-atropine (LOMOTIL) 2.5-0.025 MG tablet Take 1-2 tablets by mouth 4 (four) times daily as needed for diarrhea or loose stools. 60 tablet 0  . finasteride (PROSCAR) 5 MG tablet Take 5 mg by mouth daily.     Marland Kitchen lidocaine-prilocaine (EMLA) cream Apply to affected area once 30 g 3  . lovastatin (MEVACOR) 40 MG tablet Take 40 mg by mouth daily.     . naproxen sodium (ALEVE) 220 MG tablet Take 220 mg by mouth daily as needed (pain).    Marland Kitchen  ondansetron (ZOFRAN) 8 MG tablet Take 1 tablet (8 mg total) by mouth 2 (two) times daily as needed for refractory nausea / vomiting. Start on day 3 after chemotherapy. 30 tablet 1  . prochlorperazine (COMPAZINE) 10 MG tablet Take 1 tablet (10 mg total) by mouth every 6 (six) hours as needed (Nausea or vomiting). 30 tablet 1   No current facility-administered medications for this visit.     PHYSICAL EXAMINATION:  ECOG PERFORMANCE STATUS: 2 - Symptomatic, <50% confined to bed  Vitals:   02/17/19 1140  BP: 131/72  Pulse: 73  Resp: 18  Temp:  97.6 F (36.4 C)  SpO2: 99%   Filed Weights   02/17/19 1140  Weight: 153 lb 11.2 oz (69.7 kg)    GENERAL:alert, no distress and comfortable SKIN: skin color, texture, turgor are normal, no rashes or significant lesions EYES: normal, Conjunctiva are pink and non-injected, sclera clear OROPHARYNX:no exudate, no erythema and lips, buccal mucosa, and tongue normal  NECK: supple, thyroid normal size, non-tender, without nodularity LYMPH:  no palpable lymphadenopathy in the cervical, axillary or inguinal LUNGS: clear to auscultation and percussion with normal breathing effort HEART: regular rate & rhythm and no murmurs and no lower extremity edema ABDOMEN:abdomen soft, non-tender and normal bowel sounds Musculoskeletal:no cyanosis of digits and no clubbing  NEURO: alert & oriented x 3 with fluent speech, no focal motor/sensory deficits  LABORATORY DATA:  I have reviewed the data as listed CBC Latest Ref Rng & Units 02/17/2019 02/03/2019 01/26/2019  WBC 4.0 - 10.5 K/uL 7.1 6.4 7.2  Hemoglobin 13.0 - 17.0 g/dL 11.4(L) 10.4(L) 9.7(L)  Hematocrit 39.0 - 52.0 % 37.0(L) 33.7(L) 31.3(L)  Platelets 150 - 400 K/uL 160 198 212     CMP Latest Ref Rng & Units 02/17/2019 02/03/2019 01/26/2019  Glucose 70 - 99 mg/dL 102(H) 97 104(H)  BUN 8 - 23 mg/dL _0 Creatinine 0.61 - 1.24 mg/dL 1.00 0.85 1.09  Sodium 135 - 145 mmol/L 144 143 144  Potassium 3.5 - 5.1 mmol/L 3.8 3.6 3.5  Chloride 98 - 111 mmol/L 111 110 111  CO2 22 - 32 mmol/L _1 Calcium 8.9 - 10.3 mg/dL 8.8(L) 8.6(L) 8.7(L)  Total Protein 6.5 - 8.1 g/dL 6.4(L) 6.3(L) 6.5  Total Bilirubin 0.3 - 1.2 mg/dL 0.5 0.6 0.4  Alkaline Phos 38 - 126 U/L 74 68 65  AST 15 - 41 U/L 17 17 13(L)  ALT 0 - 44 U/L _2 RADIOGRAPHIC STUDIES: I have personally reviewed the radiological images as listed and agreed with the findings in the report. No results found.   ASSESSMENT & PLAN:  JERIK FALLETTA is a 85 y.o. male with history  of  1. Rectal Cancer, invasive adenocarcinoma,with lung and liver metastasis, cTxNxM1, stage IV -Diagnosed in 09/2017.His initial staging scans showedbilateral lung nodules, highly suspicious for lung metastasis.He declined biopsy of his lung lesions. He was treatedwith radiation andoralchemoXelodato his primary rectal cancer.I last saw him in 03/2018 and he lost f/u since then.His 12/25/18 rectal biopsy and2/5/20 Lung biopsy has confirmedrectal caner recurrence with lungmetastasis. -He started FOLFOX every 2 weeks on 01/20/19.Due to his advanced age, I reduced chemo dose. He tolerated well.  - I will schedule restaging scan after 5-6 cycles of chemotherapy  -He is clinically stable, diarrhea has improved some since he started chemo.  Labs reviewed, not anemia stable, otherwise unremarkable, adequate for chemotherapy, will proceed to cycle 3 today. -Follow-up in  2 weeks before cycle 4  2. Anemia,secondary to rectal bleeding andiron deficiency -new sinceearly 2019  -probably related to his rectal cancer bleeding -will check iron studyperiodically. -S/p2doses of IV iron recently, he responded well, hemoglobin improved   3. Diarrhea, Rectal Bleeding  -Secondary to rectal cancer recurrence  - He takes Lomotil or Imodium as needed up to 4-5 times a day  -Hematochezia has resolved, diarrhea improved since he started chemo.  4. Hemoptysis  -mild, sincelung mass biopsy,possible related to biopsy or bleeding from tumor - I will monitor clinically.  He has not had recurrent hemoptysis lately.  5.Goal of care discussion  -The patient understands the goal of care is palliativeand his cancer is not curable -he is full code now    Plan - Labs reviewed, he is adequate to have second cycle FOLFOX today with dose increase of 5-FU -Lab, flush, f/u and FOLFOX on 02/18/2019  No problem-specific Assessment & Plan notes found for this encounter.   No orders of the  defined types were placed in this encounter.  All questions were answered. The patient knows to call the clinic with any problems, questions or concerns. No barriers to learning was detected. I spent 20 minutes counseling the patient face to face. The total time spent in the appointment was 25 minutes and more than 50% was on counseling and review of test results  I, Manson Allan am acting as scribe for Dr. Truitt Merle.  I have reviewed the above documentation for accuracy and completeness, and I agree with the above.     Truitt Merle, MD 02/17/2019

## 2019-02-17 ENCOUNTER — Encounter: Payer: Self-pay | Admitting: Hematology

## 2019-02-17 ENCOUNTER — Inpatient Hospital Stay: Payer: Medicare Other

## 2019-02-17 ENCOUNTER — Inpatient Hospital Stay: Payer: Medicare Other | Admitting: Hematology

## 2019-02-17 ENCOUNTER — Inpatient Hospital Stay: Payer: Medicare Other | Attending: Hematology

## 2019-02-17 ENCOUNTER — Other Ambulatory Visit: Payer: Self-pay

## 2019-02-17 VITALS — BP 131/72 | HR 73 | Temp 97.6°F | Resp 18 | Ht 70.0 in | Wt 153.7 lb

## 2019-02-17 DIAGNOSIS — Z9221 Personal history of antineoplastic chemotherapy: Secondary | ICD-10-CM | POA: Diagnosis not present

## 2019-02-17 DIAGNOSIS — C7801 Secondary malignant neoplasm of right lung: Secondary | ICD-10-CM | POA: Insufficient documentation

## 2019-02-17 DIAGNOSIS — C7802 Secondary malignant neoplasm of left lung: Secondary | ICD-10-CM | POA: Insufficient documentation

## 2019-02-17 DIAGNOSIS — D500 Iron deficiency anemia secondary to blood loss (chronic): Secondary | ICD-10-CM

## 2019-02-17 DIAGNOSIS — C20 Malignant neoplasm of rectum: Secondary | ICD-10-CM

## 2019-02-17 DIAGNOSIS — Z5112 Encounter for antineoplastic immunotherapy: Secondary | ICD-10-CM | POA: Diagnosis not present

## 2019-02-17 DIAGNOSIS — Z923 Personal history of irradiation: Secondary | ICD-10-CM | POA: Diagnosis not present

## 2019-02-17 DIAGNOSIS — Z5111 Encounter for antineoplastic chemotherapy: Secondary | ICD-10-CM | POA: Diagnosis present

## 2019-02-17 DIAGNOSIS — R042 Hemoptysis: Secondary | ICD-10-CM | POA: Diagnosis not present

## 2019-02-17 DIAGNOSIS — D509 Iron deficiency anemia, unspecified: Secondary | ICD-10-CM

## 2019-02-17 DIAGNOSIS — Z95828 Presence of other vascular implants and grafts: Secondary | ICD-10-CM

## 2019-02-17 DIAGNOSIS — Z79899 Other long term (current) drug therapy: Secondary | ICD-10-CM | POA: Insufficient documentation

## 2019-02-17 DIAGNOSIS — R197 Diarrhea, unspecified: Secondary | ICD-10-CM

## 2019-02-17 DIAGNOSIS — C787 Secondary malignant neoplasm of liver and intrahepatic bile duct: Secondary | ICD-10-CM | POA: Diagnosis not present

## 2019-02-17 LAB — CBC WITH DIFFERENTIAL/PLATELET
Abs Immature Granulocytes: 0.02 10*3/uL (ref 0.00–0.07)
Basophils Absolute: 0.1 10*3/uL (ref 0.0–0.1)
Basophils Relative: 1 %
EOS ABS: 0.2 10*3/uL (ref 0.0–0.5)
Eosinophils Relative: 2 %
HCT: 37 % — ABNORMAL LOW (ref 39.0–52.0)
Hemoglobin: 11.4 g/dL — ABNORMAL LOW (ref 13.0–17.0)
Immature Granulocytes: 0 %
Lymphocytes Relative: 7 %
Lymphs Abs: 0.5 10*3/uL — ABNORMAL LOW (ref 0.7–4.0)
MCH: 27.5 pg (ref 26.0–34.0)
MCHC: 30.8 g/dL (ref 30.0–36.0)
MCV: 89.2 fL (ref 80.0–100.0)
Monocytes Absolute: 0.8 10*3/uL (ref 0.1–1.0)
Monocytes Relative: 11 %
Neutro Abs: 5.6 10*3/uL (ref 1.7–7.7)
Neutrophils Relative %: 79 %
Platelets: 160 10*3/uL (ref 150–400)
RBC: 4.15 MIL/uL — ABNORMAL LOW (ref 4.22–5.81)
RDW: 22.2 % — ABNORMAL HIGH (ref 11.5–15.5)
WBC: 7.1 10*3/uL (ref 4.0–10.5)
nRBC: 0 % (ref 0.0–0.2)

## 2019-02-17 LAB — COMPREHENSIVE METABOLIC PANEL
ALT: 11 U/L (ref 0–44)
AST: 17 U/L (ref 15–41)
Albumin: 3.2 g/dL — ABNORMAL LOW (ref 3.5–5.0)
Alkaline Phosphatase: 74 U/L (ref 38–126)
Anion gap: 10 (ref 5–15)
BUN: 19 mg/dL (ref 8–23)
CALCIUM: 8.8 mg/dL — AB (ref 8.9–10.3)
CO2: 23 mmol/L (ref 22–32)
Chloride: 111 mmol/L (ref 98–111)
Creatinine, Ser: 1 mg/dL (ref 0.61–1.24)
GFR calc Af Amer: >60 mL/min (ref >60)
GFR calc non Af Amer: >60 mL/min (ref >60)
Glucose, Bld: 102 mg/dL — ABNORMAL HIGH (ref 70–99)
Potassium: 3.8 mmol/L (ref 3.5–5.1)
Sodium: 144 mmol/L (ref 135–145)
Total Bilirubin: 0.5 mg/dL (ref 0.3–1.2)
Total Protein: 6.4 g/dL — ABNORMAL LOW (ref 6.5–8.1)

## 2019-02-17 MED ORDER — SODIUM CHLORIDE 0.9% FLUSH
3.0000 mL | Freq: Once | INTRAVENOUS | Status: DC | PRN
  Filled 2019-02-17: qty 10

## 2019-02-17 MED ORDER — DEXAMETHASONE SODIUM PHOSPHATE 10 MG/ML IJ SOLN
INTRAMUSCULAR | Status: AC
  Filled 2019-02-17: qty 1

## 2019-02-17 MED ORDER — PALONOSETRON HCL INJECTION 0.25 MG/5ML
0.2500 mg | Freq: Once | INTRAVENOUS | Status: AC
  Administered 2019-02-17: 0.25 mg via INTRAVENOUS

## 2019-02-17 MED ORDER — OXALIPLATIN CHEMO INJECTION 100 MG/20ML
60.0000 mg/m2 | Freq: Once | INTRAVENOUS | Status: AC
  Administered 2019-02-17: 110 mg via INTRAVENOUS
  Filled 2019-02-17: qty 20

## 2019-02-17 MED ORDER — DEXTROSE 5 % IV SOLN
Freq: Once | INTRAVENOUS | Status: AC
  Administered 2019-02-17: 13:00:00 via INTRAVENOUS
  Filled 2019-02-17: qty 250

## 2019-02-17 MED ORDER — SODIUM CHLORIDE 0.9 % IV SOLN
2400.0000 mg/m2 | INTRAVENOUS | Status: DC
  Administered 2019-02-17: 4500 mg via INTRAVENOUS
  Filled 2019-02-17: qty 90

## 2019-02-17 MED ORDER — LEUCOVORIN CALCIUM INJECTION 350 MG
400.0000 mg/m2 | Freq: Once | INTRAVENOUS | Status: AC
  Administered 2019-02-17: 748 mg via INTRAVENOUS
  Filled 2019-02-17: qty 37.4

## 2019-02-17 MED ORDER — DEXAMETHASONE SODIUM PHOSPHATE 10 MG/ML IJ SOLN
10.0000 mg | Freq: Once | INTRAMUSCULAR | Status: AC
  Administered 2019-02-17: 10 mg via INTRAVENOUS

## 2019-02-17 MED ORDER — SODIUM CHLORIDE 0.9% FLUSH
10.0000 mL | Freq: Once | INTRAVENOUS | Status: AC
  Administered 2019-02-17: 10 mL
  Filled 2019-02-17: qty 10

## 2019-02-17 MED ORDER — SODIUM CHLORIDE 0.9 % IV SOLN: 5.0000 mg/kg | Freq: Once | INTRAVENOUS | Status: DC

## 2019-02-17 MED ORDER — PALONOSETRON HCL INJECTION 0.25 MG/5ML
INTRAVENOUS | Status: AC
  Filled 2019-02-17: qty 5

## 2019-02-17 NOTE — Progress Notes (Signed)
Spoke w/ Dr. Burr Medico, no Avastin today. She is awaiting [facility] results before starting adjunctive treatment.   Demetrius Charity, PharmD, [facility] Pharmacist Pharmacy Phone: [phone] 02/17/2019

## 2019-02-17 NOTE — Progress Notes (Signed)
Per Dr Burr Medico ok to d/c 5FU pump Saturday 02/19/2019 at 1330.

## 2019-02-17 NOTE — Patient Instructions (Addendum)
[facility] Discharge Instructions for Patients Receiving Chemotherapy  COME BACK Saturday 02/19/2019 at 1:30 to have pump disconnected.  Today you received the following chemotherapy agents Oxaliplatin, Leucovorin, Fluorouracil To help prevent nausea and vomiting after your treatment, we encourage you to take your nausea medication as directed.  If you develop nausea and vomiting that is not controlled by your nausea medication, call the clinic.   BELOW ARE SYMPTOMS THAT SHOULD BE REPORTED IMMEDIATELY:  *FEVER GREATER THAN 100.5 F  *CHILLS WITH OR WITHOUT FEVER  NAUSEA AND VOMITING THAT IS NOT CONTROLLED WITH YOUR NAUSEA MEDICATION  *UNUSUAL SHORTNESS OF BREATH  *UNUSUAL BRUISING OR BLEEDING  TENDERNESS IN MOUTH AND THROAT WITH OR WITHOUT PRESENCE OF ULCERS  *URINARY PROBLEMS  *BOWEL PROBLEMS  UNUSUAL RASH Items with * indicate a potential emergency and should be followed up as soon as possible.  Feel free to call the clinic should you have any questions or concerns. The clinic phone number is [phone].  Please show the [facility] at check-in to the Emergency Department and triage nurse.

## 2019-02-18 ENCOUNTER — Telehealth: Payer: Self-pay | Admitting: Hematology

## 2019-02-18 NOTE — Telephone Encounter (Signed)
No los per 3/12.

## 2019-02-19 ENCOUNTER — Other Ambulatory Visit: Payer: Self-pay

## 2019-02-19 ENCOUNTER — Inpatient Hospital Stay: Payer: Medicare Other

## 2019-02-19 VITALS — BP 140/79 | HR 78 | Temp 98.3°F | Resp 18

## 2019-02-19 DIAGNOSIS — Z5112 Encounter for antineoplastic immunotherapy: Secondary | ICD-10-CM | POA: Diagnosis not present

## 2019-02-19 DIAGNOSIS — C20 Malignant neoplasm of rectum: Secondary | ICD-10-CM

## 2019-02-19 MED ORDER — SODIUM CHLORIDE 0.9% FLUSH
10.0000 mL | INTRAVENOUS | Status: DC | PRN
  Administered 2019-02-19: 10 mL
  Filled 2019-02-19: qty 10

## 2019-02-19 MED ORDER — HEPARIN SOD (PORK) LOCK FLUSH 100 UNIT/ML IV SOLN
500.0000 [IU] | Freq: Once | INTRAVENOUS | Status: AC | PRN
  Administered 2019-02-19: 500 [IU]
  Filled 2019-02-19: qty 5

## 2019-02-19 NOTE — Progress Notes (Signed)
Pt came in today for pump D/C pump taken off with 10.6 left. Pt asked if he could return, Pt. Stated he could not pump removed with 10.6 left

## 2019-03-02 NOTE — Progress Notes (Signed)
Adrian Banks   [phone] Fax:([phone]) [phone]   Clinic Follow up Note   Patient Care Team: Wenda Low, MD as PCP - General (Internal Medicine)  Date of Service:  03/03/2019  CHIEF COMPLAINT: F/u on metastatic rectal cancer   SUMMARY OF ONCOLOGIC HISTORY: Oncology History   Cancer Staging Rectal cancer [facility]) Staging form: Colon and Rectum, AJCC 8th Edition - Clinical stage from 09/29/2017: Stage IVA (cTX, cNX, cM1a) - Signed by Truitt Merle, MD on 03/21/2018      Rectal cancer ([place])   09/29/2017 Procedure    Colonoscopy by Dr. Penelope Coop 09/29/17  Findings:  The perianal and digital rectal examination were normal.  A non-obstructing large mass was found in the proximal rectum and in the mid rectum.  The mass was circumferential.  This was biopsied with a cold forceps for histology.  Area was tattooed with an injection of [place] ink both distal and proximal to the mass.  There is about 4-5 cm of rectal mucosa distal to the mass that looks norma. This mass is about 7-8 cm in length.  A 10 mm polyp was found in the ascending colon. The polyp was sessile. the polyp was removed with a hot snare.  Resection and retrieval were complete.      09/29/2017 Initial Biopsy    FINAL DIAGNOSIS 09/29/17  1. LG Intestine - Ascending Colon Polyp:  TUBULAR ADENOMA 2. LG Intestine - Rectum, Bx: INVASIVE MODERATELY DIFFERENTIATED ADENOCARCINOMA. THE MSI TESTING IS PENDING AND THE RESULT WILL BE REPORTED AS AN ADDENDUM.    09/29/2017 Cancer Staging    Staging form: Colon and Rectum, AJCC 8th Edition - Clinical stage from 09/29/2017: Stage IVA (cTX, cNX, cM1a) - Signed by Truitt Merle, MD on 03/21/2018    09/29/2017 Miscellaneous    Initial Biopsy MMR Results: MSI Stable      10/21/2017 Initial Diagnosis    Rectal cancer ([place])    10/28/2017 Imaging    CT CAP W Contrast  IMPRESSION: 1. Long segment circumferential wall thickening of the rectosigmoid colon consistent  with known adenocarcinoma. No evidence of bowel obstruction or perforation. 2. No definite signs of metastatic disease in the abdomen or pelvis. Soft tissue nodularity in the right pelvis is probably due to an unopacified pelvic vein. 3. Multiple pulmonary nodules, some cavitary. These are not typical of metastatic adenocarcinoma (especially without evidence of abdominal disease). Consider multicentric or metastatic lung cancer. PET-CT may be helpful to assess hypermetabolic activity and guided tissue sampling. 4. Multiple bladder calculi consistent with chronic bladder outlet obstruction from prostatomegaly. Nonobstructing right renal calculus. No hydronephrosis. 5. Aortic Atherosclerosis (ICD10-I70.0). Additional incidental findings including a moderate size hiatal hernia and renal cysts.     11/05/2017 - 12/18/2017 Radiation Therapy    Radiation with Dr. Lisbeth Renshaw With Xeloda    11/09/2017 - 12/18/2017 Chemotherapy    Chemoradiation with Xeloda 882m/m2 q12h, M-F on days of radiation plan to starting 11/09/17    11/11/2017 Imaging    PET Scan  IMPRESSION:  Wall thickening/ mass involving the upper rectum, corresponding to the patient's known rectal adenocarcinoma. Bilateral pulmonary nodules, partially cavitary in the left upper lobe nodule, suspicious for metastases. Notably, cavitary metastases are more common with squamous cell carcinoma rather than the patient's known adenocarcinoma.     12/25/2018 Procedure    12/25/2018 Sigmoidoscopy Impression: - Malignant tumor in the proximal rectum, in the mid rectum and in the recto-sigmoid colon. Biopsied.      12/25/2018 Pathology Results  Diagnosis 12/25/18 Rectum, biopsy - ADENOCARCINOMA. Microscopic Comment    12/25/2018 Imaging    12/25/2018 CT AP IMPRESSION: 1. Wall thickening and enhancement of the rectum and distal sigmoid colon, consistent with known rectal carcinoma. 2. New hypoattenuating lesion in the right hepatic lobe,  concerning for metastatic disease. 3. Increased size of posterior right lung base mass, now measuring 5.4 x 3.8 cm, previously 1.8 x 2.0 cm. Multiple right lower lobe pulmonary nodules, worsened since 10/28/2017, and consistent with metastatic disease. 4. Multiple large calculi within the urinary bladder.    01/12/2019 Pathology Results    Diagnosis Lung, biopsy - METASTATIC ADENOCARCINOMA WITH NECROSIS, CONSISTENT WITH PATIENT'S CLINICAL HISTORY OF PRIMARY COLORECTAL CARCINOMA.    01/20/2019 Imaging    CT Chest Wo Contrast  IMPRESSION: Multiple bilateral pulmonary metastases, including index lesions as above, progressive from 2018.  Aortic Atherosclerosis (ICD10-I70.0) and Emphysema (ICD10-J43.9).     01/20/2019 -  Chemotherapy    First line FOLFOX every 2 weeks       CURRENT THERAPY:  First lineFOLFOX every 2 weeks, starting 01/20/2019. Added Avastin with cycle 4.   INTERVAL HISTORY:  Adrian Banks is here for a follow up and treatment. He presents to the clinic today by himself. He notes he is doing well. He notes he has been having diarrhea 4 times in the day and 3 times during the night. He denies blood in stool, but has loose-watery stool. He has been using imodium 3 times a day. He has lomotil and used it 3 times a day.  He notes fatigue lasts for 2-3 days after chemo.    REVIEW OF SYSTEMS:   Constitutional: Denies fevers, chills or abnormal weight loss Eyes: Denies blurriness of vision Ears, nose, mouth, throat, and face: Denies mucositis or sore throat Respiratory: Denies cough, dyspnea or wheezes Cardiovascular: Denies palpitation, chest discomfort or lower extremity swelling Gastrointestinal:  Denies nausea, heartburn (+) frequent Diarrhea Skin: Denies abnormal skin rashes Lymphatics: Denies new lymphadenopathy or easy bruising Neurological:Denies numbness, tingling or new weaknesses Behavioral/Psych: Mood is stable, no new changes  All other systems were  reviewed with the patient and are negative.  MEDICAL HISTORY:  Past Medical History:  Diagnosis Date  . Cancer ([place]) 09/29/2017   Rectal cvancer  . High cholesterol     SURGICAL HISTORY: Past Surgical History:  Procedure Laterality Date  . BIOPSY  12/25/2018   Procedure: BIOPSY;  Surgeon: Wonda Horner, MD;  Location: [facility] ENDOSCOPY;  Service: Endoscopy;;  . FLEXIBLE SIGMOIDOSCOPY N/A 12/25/2018   Procedure: Beryle Quant;  Surgeon: Wonda Horner, MD;  Location: [facility] ENDOSCOPY;  Service: Endoscopy;  Laterality: N/A;  . IR IMAGING GUIDED PORT INSERTION  01/11/2019    I have reviewed the social history and family history with the patient and they are unchanged from previous note.  ALLERGIES:  has No Known Allergies.  MEDICATIONS:  Current Outpatient Medications  Medication Sig Dispense Refill  . diphenoxylate-atropine (LOMOTIL) 2.5-0.025 MG tablet Take 1-2 tablets by mouth 4 (four) times daily as needed for diarrhea or loose stools. 60 tablet 0  . finasteride (PROSCAR) 5 MG tablet Take 5 mg by mouth daily.     Marland Kitchen lidocaine-prilocaine (EMLA) cream Apply to affected area once 30 g 3  . lovastatin (MEVACOR) 40 MG tablet Take 40 mg by mouth daily.     . naproxen sodium (ALEVE) 220 MG tablet Take 220 mg by mouth daily as needed (pain).    . ondansetron (ZOFRAN) 8 MG tablet  Take 1 tablet (8 mg total) by mouth 2 (two) times daily as needed for refractory nausea / vomiting. Start on day 3 after chemotherapy. 30 tablet 1  . prochlorperazine (COMPAZINE) 10 MG tablet Take 1 tablet (10 mg total) by mouth every 6 (six) hours as needed (Nausea or vomiting). 30 tablet 1   No current facility-administered medications for this visit.    Facility-Administered Medications Ordered in Other Visits  Medication Dose Route Frequency Provider Last Rate Last Dose  . 0.9 %  sodium chloride infusion   Intravenous Continuous Truitt Merle, MD 20 mL/hr at 03/03/19 1150    . dextrose 5 % solution   Intravenous  Once Truitt Merle, MD      . fluorouracil (ADRUCIL) 4,500 mg in sodium chloride 0.9 % 60 mL chemo infusion  2,400 mg/m2 (Treatment Plan Recorded) Intravenous 1 day or 1 dose Truitt Merle, MD   4,500 mg at 03/03/19 1540  . heparin lock flush 100 unit/mL  500 Units Intracatheter Once PRN Truitt Merle, MD      . sodium chloride flush (NS) 0.9 % injection 10 mL  10 mL Intracatheter PRN Truitt Merle, MD        PHYSICAL EXAMINATION: ECOG PERFORMANCE STATUS: 2 - Symptomatic, <50% confined to bed  Vitals:   03/03/19 1029  BP: (!) 143/83  Pulse: 77  Resp: 17  Temp: 98 F (36.7 C)  SpO2: 100%   Filed Weights   03/03/19 1029  Weight: 155 lb 9.6 oz (70.6 kg)    GENERAL:alert, no distress and comfortable SKIN: skin color, texture, turgor are normal, no rashes or significant lesions LUNGS: clear to auscultation and percussion with normal breathing effort HEART: regular rate & rhythm and no murmurs and no lower extremity edema ABDOMEN:abdomen soft, non-tender and normal bowel sounds Musculoskeletal:no cyanosis of digits and no clubbing  NEURO: alert & oriented x 3 with fluent speech, no focal motor/sensory deficits  LABORATORY DATA:  I have reviewed the data as listed CBC Latest Ref Rng & Units 03/03/2019 02/17/2019 02/03/2019  WBC 4.0 - 10.5 K/uL 5.7 7.1 6.4  Hemoglobin 13.0 - 17.0 g/dL 12.0(L) 11.4(L) 10.4(L)  Hematocrit 39.0 - 52.0 % 38.0(L) 37.0(L) 33.7(L)  Platelets 150 - 400 K/uL 131(L) 160 198     CMP Latest Ref Rng & Units 03/03/2019 02/17/2019 02/03/2019  Glucose 70 - 99 mg/dL 97 102(H) 97  BUN 8 - 23 mg/dL _0 Creatinine 0.61 - 1.24 mg/dL 1.09 1.00 0.85  Sodium 135 - 145 mmol/L 142 144 143  Potassium 3.5 - 5.1 mmol/L 3.9 3.8 3.6  Chloride 98 - 111 mmol/L 108 111 110  CO2 22 - 32 mmol/L _1 Calcium 8.9 - 10.3 mg/dL 8.9 8.8(L) 8.6(L)  Total Protein 6.5 - 8.1 g/dL 6.4(L) 6.4(L) 6.3(L)  Total Bilirubin 0.3 - 1.2 mg/dL 0.5 0.5 0.6  Alkaline Phos 38 - 126 U/L 70 74 68  AST 15 - 41  U/L 34 17 17  ALT 0 - 44 U/L 37 11 11      RADIOGRAPHIC STUDIES: I have personally reviewed the radiological images as listed and agreed with the findings in the report. No results found.   ASSESSMENT & PLAN:  Adrian Banks is a 85 y.o. male with   1. Rectal Cancer, invasive adenocarcinoma,with lung and liver metastasis, cTxNxM1, stage IV -Diagnosed in 09/2017.His initial staging scans showedbilateral lung nodules, highly suspicious for lung metastasis.He declined biopsy of his lung lesions. He was treatedwith radiation  andoralchemoXelodato his primary rectal cancer.I last saw him in 03/2018 and he lost f/u since then.His 12/25/18 rectal biopsy and2/5/20 Lung biopsy has confirmedrectal caner recurrence with lungmetastasis. -He started FOLFOX every 2 weeks on 01/20/19.Due to his advanced age, I reduced chemo dose. He tolerated well.  -I will schedule restaging scan after 5-6 cycles of chemotherapy  -He is clinically stable, diarrhea has become more frequent and fatigue is stable. Labs reviewed, CBC and CMP WNL except stable anemia, plt at 131K, slightly lower protein level. CEA and iron panel still pending. Overall adequate to proceed with cycle 4 today.  -His [facility] testing is still pending. I will start him on biological agent Avastin in the meantime with cycle 4.  Potential benefit and side effects discussed with patient, especially small risk of thrombosis, bleeding, bowel perforation, hypertension, proteinuria, it etc.  He agrees to proceed.  I provided him with reading material on this drug.  -We discussed watching for chemo side effects accumulating. Will dose reduce if not well tolerated. He understands.  -F/u in 2 weeks   2. Anemia,secondary to rectal bleeding andiron deficiency -new sinceearly 2019  -probably related to his rectal cancer bleeding -will check iron studyperiodically. -S/p2doses of IV iron recently,he responded well, hemoglobin  improved   3. Diarrhea, Rectal Bleeding  -Secondary to rectal cancer recurrence  -He takes Lomotil or Imodium as needed up to 4-5 times a day  -Hematochezia has resolved -Diarrhea has increased to about 7 times a day.  -He has Lomotil and imodium. I instructed him to increase lomotil to 2 in the morning or in the evening and use it as needed in between, up to 8 tabs a day. -Protein level is mildly low today (03/03/19), I encouraged him to increase protein in diet.   4.Goal of care discussion  -The patient understands the goal of care is palliativeand his cancer is not curable -he is full code now   Plan -Labs reviewed, he is adequate to proceed cycle 4 FOLFOX today and add on Avastin  -Lab, flush, f/u and FOLFOXand Avastin in 2 weeks, will order restaging scan on next visit    No problem-specific Assessment & Plan notes found for this encounter.   No orders of the defined types were placed in this encounter.  All questions were answered. The patient knows to call the clinic with any problems, questions or concerns. No barriers to learning was detected. I spent 20 minutes counseling the patient face to face. The total time spent in the appointment was 25 minutes and more than 50% was on counseling and review of test results     Truitt Merle, MD 03/03/2019   I, Joslyn Devon, am acting as scribe for Truitt Merle, MD.   I have reviewed the above documentation for accuracy and completeness, and I agree with the above.

## 2019-03-03 ENCOUNTER — Telehealth: Payer: Self-pay | Admitting: Hematology

## 2019-03-03 ENCOUNTER — Inpatient Hospital Stay: Payer: Medicare Other

## 2019-03-03 ENCOUNTER — Encounter: Payer: Self-pay | Admitting: Hematology

## 2019-03-03 ENCOUNTER — Inpatient Hospital Stay (HOSPITAL_BASED_OUTPATIENT_CLINIC_OR_DEPARTMENT_OTHER): Payer: Medicare Other | Admitting: Hematology

## 2019-03-03 ENCOUNTER — Other Ambulatory Visit: Payer: Self-pay

## 2019-03-03 VITALS — BP 143/83 | HR 77 | Temp 98.0°F | Resp 17 | Ht 70.0 in | Wt 155.6 lb

## 2019-03-03 DIAGNOSIS — C20 Malignant neoplasm of rectum: Secondary | ICD-10-CM

## 2019-03-03 DIAGNOSIS — D500 Iron deficiency anemia secondary to blood loss (chronic): Secondary | ICD-10-CM

## 2019-03-03 DIAGNOSIS — C7801 Secondary malignant neoplasm of right lung: Secondary | ICD-10-CM

## 2019-03-03 DIAGNOSIS — Z95828 Presence of other vascular implants and grafts: Secondary | ICD-10-CM

## 2019-03-03 DIAGNOSIS — R042 Hemoptysis: Secondary | ICD-10-CM

## 2019-03-03 DIAGNOSIS — C7802 Secondary malignant neoplasm of left lung: Secondary | ICD-10-CM

## 2019-03-03 DIAGNOSIS — Z5112 Encounter for antineoplastic immunotherapy: Secondary | ICD-10-CM | POA: Diagnosis not present

## 2019-03-03 DIAGNOSIS — Z923 Personal history of irradiation: Secondary | ICD-10-CM

## 2019-03-03 DIAGNOSIS — C787 Secondary malignant neoplasm of liver and intrahepatic bile duct: Secondary | ICD-10-CM | POA: Diagnosis not present

## 2019-03-03 DIAGNOSIS — D509 Iron deficiency anemia, unspecified: Secondary | ICD-10-CM

## 2019-03-03 DIAGNOSIS — Z9221 Personal history of antineoplastic chemotherapy: Secondary | ICD-10-CM

## 2019-03-03 DIAGNOSIS — Z79899 Other long term (current) drug therapy: Secondary | ICD-10-CM

## 2019-03-03 LAB — COMPREHENSIVE METABOLIC PANEL
ALK PHOS: 70 U/L (ref 38–126)
ALT: 37 U/L (ref 0–44)
AST: 34 U/L (ref 15–41)
Albumin: 3.3 g/dL — ABNORMAL LOW (ref 3.5–5.0)
Anion gap: 9 (ref 5–15)
BUN: 19 mg/dL (ref 8–23)
CO2: 25 mmol/L (ref 22–32)
Calcium: 8.9 mg/dL (ref 8.9–10.3)
Chloride: 108 mmol/L (ref 98–111)
Creatinine, Ser: 1.09 mg/dL (ref 0.61–1.24)
GFR calc Af Amer: >60 mL/min (ref >60)
GFR calc non Af Amer: >60 mL/min (ref >60)
Glucose, Bld: 97 mg/dL (ref 70–99)
Potassium: 3.9 mmol/L (ref 3.5–5.1)
Sodium: 142 mmol/L (ref 135–145)
Total Bilirubin: 0.5 mg/dL (ref 0.3–1.2)
Total Protein: 6.4 g/dL — ABNORMAL LOW (ref 6.5–8.1)

## 2019-03-03 LAB — CBC WITH DIFFERENTIAL/PLATELET
Abs Immature Granulocytes: 0.01 10*3/uL (ref 0.00–0.07)
Basophils Absolute: 0.1 10*3/uL (ref 0.0–0.1)
Basophils Relative: 1 %
Eosinophils Absolute: 0.3 10*3/uL (ref 0.0–0.5)
Eosinophils Relative: 5 %
HCT: 38 % — ABNORMAL LOW (ref 39.0–52.0)
Hemoglobin: 12 g/dL — ABNORMAL LOW (ref 13.0–17.0)
Immature Granulocytes: 0 %
Lymphocytes Relative: 10 %
Lymphs Abs: 0.6 10*3/uL — ABNORMAL LOW (ref 0.7–4.0)
MCH: 28.1 pg (ref 26.0–34.0)
MCHC: 31.6 g/dL (ref 30.0–36.0)
MCV: 89 fL (ref 80.0–100.0)
MONO ABS: 0.8 10*3/uL (ref 0.1–1.0)
Monocytes Relative: 13 %
Neutro Abs: 4 10*3/uL (ref 1.7–7.7)
Neutrophils Relative %: 71 %
Platelets: 131 10*3/uL — ABNORMAL LOW (ref 150–400)
RBC: 4.27 MIL/uL (ref 4.22–5.81)
RDW: 22.4 % — ABNORMAL HIGH (ref 11.5–15.5)
WBC: 5.7 10*3/uL (ref 4.0–10.5)
nRBC: 0 % (ref 0.0–0.2)

## 2019-03-03 LAB — CEA (IN HOUSE-CHCC): CEA (CHCC-In House): 48.87 ng/mL — ABNORMAL HIGH (ref 0.00–5.00)

## 2019-03-03 LAB — IRON AND TIBC
Iron: 60 ug/dL (ref 42–163)
Saturation Ratios: 20 % (ref 20–55)
TIBC: 291 ug/dL (ref 202–409)
UIBC: 232 ug/dL (ref 117–376)

## 2019-03-03 LAB — TOTAL PROTEIN, URINE DIPSTICK: Protein, ur: NEGATIVE mg/dL

## 2019-03-03 LAB — FERRITIN: Ferritin: 95 ng/mL (ref 24–336)

## 2019-03-03 MED ORDER — LEUCOVORIN CALCIUM INJECTION 350 MG
400.0000 mg/m2 | Freq: Once | INTRAVENOUS | Status: AC
  Administered 2019-03-03: 748 mg via INTRAVENOUS
  Filled 2019-03-03: qty 37.4

## 2019-03-03 MED ORDER — SODIUM CHLORIDE 0.9 % IV SOLN
2400.0000 mg/m2 | INTRAVENOUS | Status: DC
  Administered 2019-03-03: 4500 mg via INTRAVENOUS
  Filled 2019-03-03: qty 90

## 2019-03-03 MED ORDER — HEPARIN SOD (PORK) LOCK FLUSH 100 UNIT/ML IV SOLN
500.0000 [IU] | Freq: Once | INTRAVENOUS | Status: DC | PRN
  Filled 2019-03-03: qty 5

## 2019-03-03 MED ORDER — SODIUM CHLORIDE 0.9% FLUSH
10.0000 mL | INTRAVENOUS | Status: DC | PRN
  Filled 2019-03-03: qty 10

## 2019-03-03 MED ORDER — PALONOSETRON HCL INJECTION 0.25 MG/5ML
INTRAVENOUS | Status: AC
  Filled 2019-03-03: qty 5

## 2019-03-03 MED ORDER — OXALIPLATIN CHEMO INJECTION 100 MG/20ML
60.0000 mg/m2 | Freq: Once | INTRAVENOUS | Status: AC
  Administered 2019-03-03: 110 mg via INTRAVENOUS
  Filled 2019-03-03: qty 20

## 2019-03-03 MED ORDER — DEXAMETHASONE SODIUM PHOSPHATE 10 MG/ML IJ SOLN
10.0000 mg | Freq: Once | INTRAMUSCULAR | Status: AC
  Administered 2019-03-03: 10 mg via INTRAVENOUS

## 2019-03-03 MED ORDER — SODIUM CHLORIDE 0.9% FLUSH
10.0000 mL | Freq: Once | INTRAVENOUS | Status: AC
  Administered 2019-03-03: 10 mL
  Filled 2019-03-03: qty 10

## 2019-03-03 MED ORDER — SODIUM CHLORIDE 0.9 % IV SOLN
INTRAVENOUS | Status: DC
  Administered 2019-03-03: 12:00:00 via INTRAVENOUS
  Filled 2019-03-03: qty 250

## 2019-03-03 MED ORDER — SODIUM CHLORIDE 0.9 % IV SOLN
5.0000 mg/kg | Freq: Once | INTRAVENOUS | Status: AC
  Administered 2019-03-03: 350 mg via INTRAVENOUS
  Filled 2019-03-03: qty 14

## 2019-03-03 MED ORDER — DEXAMETHASONE SODIUM PHOSPHATE 10 MG/ML IJ SOLN
INTRAMUSCULAR | Status: AC
  Filled 2019-03-03: qty 1

## 2019-03-03 MED ORDER — DEXTROSE 5 % IV SOLN
Freq: Once | INTRAVENOUS | Status: DC
  Filled 2019-03-03: qty 250

## 2019-03-03 MED ORDER — PALONOSETRON HCL INJECTION 0.25 MG/5ML
0.2500 mg | Freq: Once | INTRAVENOUS | Status: AC
  Administered 2019-03-03: 0.25 mg via INTRAVENOUS

## 2019-03-03 MED ORDER — DEXTROSE 5 % IV SOLN
Freq: Once | INTRAVENOUS | Status: AC
  Administered 2019-03-03: 13:00:00 via INTRAVENOUS
  Filled 2019-03-03: qty 250

## 2019-03-03 NOTE — Telephone Encounter (Signed)
No los per 3/26. 

## 2019-03-05 ENCOUNTER — Inpatient Hospital Stay: Payer: Medicare Other

## 2019-03-05 ENCOUNTER — Other Ambulatory Visit: Payer: Self-pay

## 2019-03-05 VITALS — BP 119/74 | HR 76 | Temp 98.1°F

## 2019-03-05 DIAGNOSIS — Z5112 Encounter for antineoplastic immunotherapy: Secondary | ICD-10-CM | POA: Diagnosis not present

## 2019-03-05 DIAGNOSIS — C20 Malignant neoplasm of rectum: Secondary | ICD-10-CM

## 2019-03-05 MED ORDER — SODIUM CHLORIDE 0.9% FLUSH
10.0000 mL | INTRAVENOUS | Status: DC | PRN
  Administered 2019-03-05: 10 mL
  Filled 2019-03-05: qty 10

## 2019-03-05 MED ORDER — HEPARIN SOD (PORK) LOCK FLUSH 100 UNIT/ML IV SOLN
500.0000 [IU] | Freq: Once | INTRAVENOUS | Status: AC | PRN
  Administered 2019-03-05: 500 [IU]
  Filled 2019-03-05: qty 5

## 2019-03-16 ENCOUNTER — Encounter (HOSPITAL_COMMUNITY): Payer: Self-pay | Admitting: Hematology

## 2019-03-16 NOTE — Progress Notes (Signed)
Adrian Banks   [phone] [phone]   Clinic Follow up Note   Patient Care Team: Wenda Low, MD as PCP - General (Internal Medicine)  Date of Service:  03/17/2019  CHIEF COMPLAINT: F/u on metastatic rectal cancer  SUMMARY OF ONCOLOGIC HISTORY: Oncology History   Cancer Staging Rectal cancer [facility]) Staging form: Colon and Rectum, AJCC 8th Edition - Clinical stage from 09/29/2017: Stage IVA (cTX, cNX, cM1a) - Signed by Truitt Merle, MD on 03/21/2018      Rectal cancer ([place])   09/29/2017 Procedure    Colonoscopy by Dr. Penelope Coop 09/29/17  Findings:  The perianal and digital rectal examination were normal.  A non-obstructing large mass was found in the proximal rectum and in the mid rectum.  The mass was circumferential.  This was biopsied with a cold forceps for histology.  Area was tattooed with an injection of [place] ink both distal and proximal to the mass.  There is about 4-5 cm of rectal mucosa distal to the mass that looks norma. This mass is about 7-8 cm in length.  A 10 mm polyp was found in the ascending colon. The polyp was sessile. the polyp was removed with a hot snare.  Resection and retrieval were complete.      09/29/2017 Initial Biopsy    FINAL DIAGNOSIS 09/29/17  1. LG Intestine - Ascending Colon Polyp:  TUBULAR ADENOMA 2. LG Intestine - Rectum, Bx: INVASIVE MODERATELY DIFFERENTIATED ADENOCARCINOMA. THE MSI TESTING IS PENDING AND THE RESULT WILL BE REPORTED AS AN ADDENDUM.    09/29/2017 Cancer Staging    Staging form: Colon and Rectum, AJCC 8th Edition - Clinical stage from 09/29/2017: Stage IVA (cTX, cNX, cM1a) - Signed by Truitt Merle, MD on 03/21/2018    09/29/2017 Miscellaneous    Initial Biopsy MMR Results: MSI Stable      10/21/2017 Initial Diagnosis    Rectal cancer ([place])    10/28/2017 Imaging    CT CAP W Contrast  IMPRESSION: 1. Long segment circumferential wall thickening of the rectosigmoid colon consistent with  known adenocarcinoma. No evidence of bowel obstruction or perforation. 2. No definite signs of metastatic disease in the abdomen or pelvis. Soft tissue nodularity in the right pelvis is probably due to an unopacified pelvic vein. 3. Multiple pulmonary nodules, some cavitary. These are not typical of metastatic adenocarcinoma (especially without evidence of abdominal disease). Consider multicentric or metastatic lung cancer. PET-CT may be helpful to assess hypermetabolic activity and guided tissue sampling. 4. Multiple bladder calculi consistent with chronic bladder outlet obstruction from prostatomegaly. Nonobstructing right renal calculus. No hydronephrosis. 5. Aortic Atherosclerosis (ICD10-I70.0). Additional incidental findings including a moderate size hiatal hernia and renal cysts.     11/05/2017 - 12/18/2017 Radiation Therapy    Radiation with Dr. Lisbeth Renshaw With Xeloda    11/09/2017 - 12/18/2017 Chemotherapy    Chemoradiation with Xeloda 860m/m2 q12h, M-F on days of radiation plan to starting 11/09/17    11/11/2017 Imaging    PET Scan  IMPRESSION:  Wall thickening/ mass involving the upper rectum, corresponding to the patient's known rectal adenocarcinoma. Bilateral pulmonary nodules, partially cavitary in the left upper lobe nodule, suspicious for metastases. Notably, cavitary metastases are more common with squamous cell carcinoma rather than the patient's known adenocarcinoma.     12/25/2018 Procedure    12/25/2018 Sigmoidoscopy Impression: - Malignant tumor in the proximal rectum, in the mid rectum and in the recto-sigmoid colon. Biopsied.      12/25/2018 Pathology Results  Diagnosis 12/25/18 Rectum, biopsy - ADENOCARCINOMA. Microscopic Comment    12/25/2018 Imaging    12/25/2018 CT AP IMPRESSION: 1. Wall thickening and enhancement of the rectum and distal sigmoid colon, consistent with known rectal carcinoma. 2. New hypoattenuating lesion in the right hepatic lobe,  concerning for metastatic disease. 3. Increased size of posterior right lung base mass, now measuring 5.4 x 3.8 cm, previously 1.8 x 2.0 cm. Multiple right lower lobe pulmonary nodules, worsened since 10/28/2017, and consistent with metastatic disease. 4. Multiple large calculi within the urinary bladder.    01/12/2019 Pathology Results    Diagnosis Lung, biopsy - METASTATIC ADENOCARCINOMA WITH NECROSIS, CONSISTENT WITH PATIENT'S CLINICAL HISTORY OF PRIMARY COLORECTAL CARCINOMA.    01/12/2019 Miscellaneous    Foundation One 01/12/19  MSI-stable  KRAS G12V MRAS Wildtype (Codones 12, 13, 59, 61, 117, and 146 in exons 2, 3 and 4) Tumor Mutational Burden - 5 Muts/Mb Alk R1192Q APC Q759FM*3 APC Splice Site 8466+5L>D ASXL1 R1415* MEN1 MEN1([id number])  SF3B1 K666N TP53 R273C    01/20/2019 Imaging    CT Chest Wo Contrast  IMPRESSION: Multiple bilateral pulmonary metastases, including index lesions as above, progressive from 2018.  Aortic Atherosclerosis (ICD10-I70.0) and Emphysema (ICD10-J43.9).     01/20/2019 -  Chemotherapy    First line FOLFOX every 2 weeks       CURRENT THERAPY:  First lineFOLFOX every 2 weeks, starting 01/20/2019. Added Avastin with cycle 4  INTERVAL HISTORY:  Adrian Banks is here for a follow up and treatment. He presents to the clinic today by himself. He notes he tolerated chemo well and is having less diarrhea. He has 4 BMs during the day and 2 BMs at night. He denies blood in stool or bleeding. He denies nausea, but has to monitor his steps to manage his balance. He feels his balance improved after Pump D/c. He notes he has a cane but does not want to use it.  He notes he would need a refill of lomotil. He notes he is interested in chemo break at some point to get himself balanced.    REVIEW OF SYSTEMS:   Constitutional: Denies fevers, chills or abnormal weight loss Eyes: Denies blurriness of vision Ears, nose, mouth, throat, and face: Denies  mucositis or sore throat Respiratory: Denies cough, dyspnea or wheezes Cardiovascular: Denies palpitation, chest discomfort or lower extremity swelling Gastrointestinal:  Denies nausea, heartburn (+) improved diarrhea  Skin: Denies abnormal skin rashes Lymphatics: Denies new lymphadenopathy or easy bruising Neurological:Denies numbness, tingling or new weaknesses (+) Balance unsteady with chemo Behavioral/Psych: Mood is stable, no new changes  All other systems were reviewed with the patient and are negative.  MEDICAL HISTORY:  Past Medical History:  Diagnosis Date   Cancer ([place]) 09/29/2017   Rectal cvancer   High cholesterol     SURGICAL HISTORY: Past Surgical History:  Procedure Laterality Date   BIOPSY  12/25/2018   Procedure: BIOPSY;  Surgeon: Wonda Horner, MD;  Location: [facility] ENDOSCOPY;  Service: Endoscopy;;   FLEXIBLE SIGMOIDOSCOPY N/A 12/25/2018   Procedure: Beryle Quant;  Surgeon: Wonda Horner, MD;  Location: [facility] ENDOSCOPY;  Service: Endoscopy;  Laterality: N/A;   IR IMAGING GUIDED PORT INSERTION  01/11/2019    I have reviewed the social history and family history with the patient and they are unchanged from previous note.  ALLERGIES:  has No Known Allergies.  MEDICATIONS:  Current Outpatient Medications  Medication Sig Dispense Refill   diphenoxylate-atropine (LOMOTIL) 2.5-0.025 MG tablet Take 1-2 tablets  by mouth 4 (four) times daily as needed for diarrhea or loose stools. 60 tablet 1   finasteride (PROSCAR) 5 MG tablet Take 5 mg by mouth daily.      lidocaine-prilocaine (EMLA) cream Apply to affected area once 30 g 3   lovastatin (MEVACOR) 40 MG tablet Take 40 mg by mouth daily.      naproxen sodium (ALEVE) 220 MG tablet Take 220 mg by mouth daily as needed (pain).     ondansetron (ZOFRAN) 8 MG tablet Take 1 tablet (8 mg total) by mouth 2 (two) times daily as needed for refractory nausea / vomiting. Start on day 3 after chemotherapy. 30 tablet 1     prochlorperazine (COMPAZINE) 10 MG tablet Take 1 tablet (10 mg total) by mouth every 6 (six) hours as needed (Nausea or vomiting). 30 tablet 1   No current facility-administered medications for this visit.    Facility-Administered Medications Ordered in Other Visits  Medication Dose Route Frequency Provider Last Rate Last Dose   bevacizumab (AVASTIN) 350 mg in sodium chloride 0.9 % 100 mL chemo infusion  5 mg/kg (Treatment Plan Recorded) Intravenous Once Truitt Merle, MD       dextrose 5 % solution   Intravenous Once Truitt Merle, MD       fluorouracil (ADRUCIL) 4,500 mg in sodium chloride 0.9 % 60 mL chemo infusion  2,400 mg/m2 (Treatment Plan Recorded) Intravenous 1 day or 1 dose Truitt Merle, MD       leucovorin 748 mg in dextrose 5 % 250 mL infusion  400 mg/m2 (Treatment Plan Recorded) Intravenous Once Truitt Merle, MD       oxaliplatin (ELOXATIN) 110 mg in dextrose 5 % 500 mL chemo infusion  60 mg/m2 (Treatment Plan Recorded) Intravenous Once Truitt Merle, MD        PHYSICAL EXAMINATION: ECOG PERFORMANCE STATUS: 2 - Symptomatic, <50% confined to bed  Vitals with BMI 03/17/2019  Height 5'10"  Weight 155lbs 10oz  BMI 50.53  Systolic 976  Diastolic 85  Pulse 68  Respirations 17    GENERAL:alert, no distress and comfortable SKIN: skin color, texture, turgor are normal, no rashes or significant lesions EYES: normal, Conjunctiva are pink and non-injected, sclera clear OROPHARYNX:no exudate, no erythema and lips, buccal mucosa, and tongue normal  NECK: supple, thyroid normal size, non-tender, without nodularity LYMPH:  no palpable lymphadenopathy in the cervical, axillary or inguinal LUNGS: clear to auscultation and percussion with normal breathing effort HEART: regular rate & rhythm and no murmurs and no lower extremity edema ABDOMEN:abdomen soft, non-tender and normal bowel sounds Musculoskeletal:no cyanosis of digits and no clubbing  NEURO: alert & oriented x 3 with fluent speech, no  focal motor/sensory deficits  LABORATORY DATA:  I have reviewed the data as listed CBC Latest Ref Rng & Units 03/17/2019 03/03/2019 02/17/2019  WBC 4.0 - 10.5 K/uL 4.6 5.7 7.1  Hemoglobin 13.0 - 17.0 g/dL 12.7(L) 12.0(L) 11.4(L)  Hematocrit 39.0 - 52.0 % 38.4(L) 38.0(L) 37.0(L)  Platelets 150 - 400 K/uL 159 131(L) 160     CMP Latest Ref Rng & Units 03/17/2019 03/03/2019 02/17/2019  Glucose 70 - 99 mg/dL 93 97 102(H)  BUN 8 - 23 mg/dL _0 Creatinine 0.61 - 1.24 mg/dL 0.90 1.09 1.00  Sodium 135 - 145 mmol/L 142 142 144  Potassium 3.5 - 5.1 mmol/L 3.9 3.9 3.8  Chloride 98 - 111 mmol/L 110 108 111  CO2 22 - 32 mmol/L _1 Calcium 8.9 - 10.3  mg/dL 8.8(L) 8.9 8.8(L)  Total Protein 6.5 - 8.1 g/dL 6.6 6.4(L) 6.4(L)  Total Bilirubin 0.3 - 1.2 mg/dL 0.4 0.5 0.5  Alkaline Phos 38 - 126 U/L 71 70 74  AST 15 - 41 U/L 23 34 17  ALT 0 - 44 U/L 18 37 11      RADIOGRAPHIC STUDIES: I have personally reviewed the radiological images as listed and agreed with the findings in the report. No results found.   ASSESSMENT & PLAN:  Adrian Banks is a 85 y.o. male with   1. Rectal Cancer, invasive adenocarcinoma,with lung and liver metastasis, cTxNxM1, stage IV, MSS, KRAS mutation(+) -Diagnosed in 09/2017.His initial staging scans showedbilateral lung nodules, highly suspicious for lung metastasis.He declined biopsy of his lung lesions. He was treatedwith radiation andoralchemoXelodato his primary rectal cancer.I last saw him in 03/2018 and he lost f/u since then.His 12/25/18 rectal biopsy and2/5/20 Lung biopsy has confirmedrectal caner recurrence with lungmetastasis. -He started FOLFOX every 2 weeks on 01/20/19.Due to his advanced age, I reduced chemo dose. He tolerated well. I added biological agent Avastin starting with cycle 4.  -His FO results shows MSI-stable. KRAS mutation. He is not eligible for immunotherapy or EGFR inhibitor. I discussed with pt  -He is clinically doing  better. Diarrhea has mildly improved. He has been unsteady after chemo,probably due to fatigue. I instructed him to start using cane. He recovers well during second week. Labs reviewed, CBC and CMP WNL except mild anemia with Hg 12.7, Ca 8.8, albumin 3.3. Overall adequate to proceed with cycle 5 today.  -I discussed he will continue chemo for as long as he can tolerate and it controls his disease. I explained if he stops treatment his cancer will grow. If he remains stable on treatment in the future we can switch him to maintenance therapy. We can give chemo breaks as needed. He understands. He would like to postpone his next treatment for a week, which is very reasonable due to the current COVID 19 outbreak  -I will schedule restaging CT scan after cycle 6.  -f/u in 3 weeks before cycle 6  2. Anemia,secondary to rectal bleeding andiron deficiency -new sinceearly 2019  -probably related to his rectal cancer bleeding -will check iron studyperiodically. -S/p2doses of IV iron recently,he responded well.  -HG at 12.7 today (03/17/19)  3. Diarrhea, Rectal Bleeding  -Secondary to rectal cancer recurrence  -He takes Lomotil or Imodium as needed up to 4-5 times a day -Hematochezia has resolved -Diarrhea has improved. He has bowel movements 4 times during the day and 2 times at night.  -Continue lomotil to 2 in the morning or in the evening and use it as needed in between, up to 8 tabs a day. I refilled lomotil today  -Protein normal and albumin 3.3. today (4/9//20).   4.Goal of care discussion  -The patient understands the goal of care is palliativeand his cancer is not curable -he is full code now   Plan -I refilled lomotil today  -Labs reviewed, he is adequate to proceed cycle 5 FOLFOX today and Avastin today  -Lab, flush, f/u and FOLFOXand Avastin in 3 weeks (postpone for one week per pt's request)  -restaging CT CAP with contrast in 4-5 weeks     No problem-specific  Assessment & Plan notes found for this encounter.   Orders Placed This Encounter  Procedures   CT Chest W Contrast    Standing Status:   Future    Standing Expiration Date:  03/16/2020    Order Specific Question:   If indicated for the ordered procedure, I authorize the administration of contrast media per Radiology protocol    Answer:   Yes    Order Specific Question:   Preferred imaging location?    Answer:   [facility]    Order Specific Question:   Radiology Contrast Protocol - do NOT remove file path    Answer:   [email or web address]   CT Abdomen Pelvis W Contrast    Standing Status:   Future    Standing Expiration Date:   03/16/2020    Order Specific Question:   If indicated for the ordered procedure, I authorize the administration of contrast media per Radiology protocol    Answer:   Yes    Order Specific Question:   Preferred imaging location?    Answer:   [facility]    Order Specific Question:   Is Oral Contrast requested for this exam?    Answer:   Yes, Per Radiology protocol    Order Specific Question:   Radiology Contrast Protocol - do NOT remove file path    Answer:   \[email or web address]   All questions were answered. The patient knows to call the clinic with any problems, questions or concerns. No barriers to learning was detected. I spent 20 minutes counseling the patient face to face. The total time spent in the appointment was 25 minutes and more than 50% was on counseling and review of test results     Truitt Merle, MD 03/17/2019   I, Joslyn Devon, am acting as scribe for Truitt Merle, MD.   I have reviewed the above documentation for accuracy and completeness, and I agree with the above.

## 2019-03-17 ENCOUNTER — Inpatient Hospital Stay: Payer: Medicare Other | Attending: Hematology

## 2019-03-17 ENCOUNTER — Inpatient Hospital Stay: Payer: Medicare Other | Admitting: Hematology

## 2019-03-17 ENCOUNTER — Inpatient Hospital Stay: Payer: Medicare Other

## 2019-03-17 ENCOUNTER — Telehealth: Payer: Self-pay | Admitting: Hematology

## 2019-03-17 ENCOUNTER — Encounter: Payer: Self-pay | Admitting: Hematology

## 2019-03-17 ENCOUNTER — Other Ambulatory Visit: Payer: Self-pay

## 2019-03-17 VITALS — BP 148/85 | HR 68 | Temp 97.5°F | Resp 17

## 2019-03-17 DIAGNOSIS — Z95828 Presence of other vascular implants and grafts: Secondary | ICD-10-CM

## 2019-03-17 DIAGNOSIS — R197 Diarrhea, unspecified: Secondary | ICD-10-CM | POA: Diagnosis not present

## 2019-03-17 DIAGNOSIS — D500 Iron deficiency anemia secondary to blood loss (chronic): Secondary | ICD-10-CM

## 2019-03-17 DIAGNOSIS — C7802 Secondary malignant neoplasm of left lung: Secondary | ICD-10-CM

## 2019-03-17 DIAGNOSIS — D509 Iron deficiency anemia, unspecified: Secondary | ICD-10-CM | POA: Insufficient documentation

## 2019-03-17 DIAGNOSIS — C787 Secondary malignant neoplasm of liver and intrahepatic bile duct: Secondary | ICD-10-CM | POA: Insufficient documentation

## 2019-03-17 DIAGNOSIS — Z923 Personal history of irradiation: Secondary | ICD-10-CM | POA: Diagnosis not present

## 2019-03-17 DIAGNOSIS — C20 Malignant neoplasm of rectum: Secondary | ICD-10-CM | POA: Diagnosis not present

## 2019-03-17 DIAGNOSIS — Z79899 Other long term (current) drug therapy: Secondary | ICD-10-CM | POA: Insufficient documentation

## 2019-03-17 DIAGNOSIS — Z5111 Encounter for antineoplastic chemotherapy: Secondary | ICD-10-CM | POA: Insufficient documentation

## 2019-03-17 LAB — CBC WITH DIFFERENTIAL/PLATELET
Abs Immature Granulocytes: 0.01 10*3/uL (ref 0.00–0.07)
Basophils Absolute: 0.1 10*3/uL (ref 0.0–0.1)
Basophils Relative: 1 %
Eosinophils Absolute: 0.1 10*3/uL (ref 0.0–0.5)
Eosinophils Relative: 3 %
HCT: 38.4 % — ABNORMAL LOW (ref 39.0–52.0)
Hemoglobin: 12.7 g/dL — ABNORMAL LOW (ref 13.0–17.0)
Immature Granulocytes: 0 %
Lymphocytes Relative: 11 %
Lymphs Abs: 0.5 10*3/uL — ABNORMAL LOW (ref 0.7–4.0)
MCH: 29.1 pg (ref 26.0–34.0)
MCHC: 33.1 g/dL (ref 30.0–36.0)
MCV: 88.1 fL (ref 80.0–100.0)
Monocytes Absolute: 0.7 10*3/uL (ref 0.1–1.0)
Monocytes Relative: 16 %
Neutro Abs: 3.1 10*3/uL (ref 1.7–7.7)
Neutrophils Relative %: 69 %
Platelets: 159 10*3/uL (ref 150–400)
RBC: 4.36 MIL/uL (ref 4.22–5.81)
RDW: 21.6 % — ABNORMAL HIGH (ref 11.5–15.5)
WBC: 4.6 10*3/uL (ref 4.0–10.5)
nRBC: 0 % (ref 0.0–0.2)

## 2019-03-17 LAB — COMPREHENSIVE METABOLIC PANEL
ALT: 18 U/L (ref 0–44)
AST: 23 U/L (ref 15–41)
Albumin: 3.3 g/dL — ABNORMAL LOW (ref 3.5–5.0)
Alkaline Phosphatase: 71 U/L (ref 38–126)
Anion gap: 10 (ref 5–15)
BUN: 19 mg/dL (ref 8–23)
CO2: 22 mmol/L (ref 22–32)
Calcium: 8.8 mg/dL — ABNORMAL LOW (ref 8.9–10.3)
Chloride: 110 mmol/L (ref 98–111)
Creatinine, Ser: 0.9 mg/dL (ref 0.61–1.24)
GFR calc Af Amer: >60 mL/min (ref >60)
GFR calc non Af Amer: >60 mL/min (ref >60)
Glucose, Bld: 93 mg/dL (ref 70–99)
Potassium: 3.9 mmol/L (ref 3.5–5.1)
Sodium: 142 mmol/L (ref 135–145)
Total Bilirubin: 0.4 mg/dL (ref 0.3–1.2)
Total Protein: 6.6 g/dL (ref 6.5–8.1)

## 2019-03-17 MED ORDER — LEUCOVORIN CALCIUM INJECTION 350 MG
400.0000 mg/m2 | Freq: Once | INTRAVENOUS | Status: AC
  Administered 2019-03-17: 13:00:00 748 mg via INTRAVENOUS
  Filled 2019-03-17: qty 37.4

## 2019-03-17 MED ORDER — SODIUM CHLORIDE 0.9% FLUSH
10.0000 mL | Freq: Once | INTRAVENOUS | Status: DC
  Filled 2019-03-17: qty 10

## 2019-03-17 MED ORDER — DIPHENOXYLATE-ATROPINE 2.5-0.025 MG PO TABS: 1.0000 | ORAL_TABLET | Freq: Four times a day (QID) | ORAL | 1 refills | Status: DC | PRN

## 2019-03-17 MED ORDER — DEXTROSE 5 % IV SOLN
Freq: Once | INTRAVENOUS | Status: AC
  Administered 2019-03-17: 12:00:00 via INTRAVENOUS
  Filled 2019-03-17: qty 250

## 2019-03-17 MED ORDER — DEXAMETHASONE SODIUM PHOSPHATE 10 MG/ML IJ SOLN
INTRAMUSCULAR | Status: AC
  Filled 2019-03-17: qty 1

## 2019-03-17 MED ORDER — PALONOSETRON HCL INJECTION 0.25 MG/5ML
INTRAVENOUS | Status: AC
  Filled 2019-03-17: qty 5

## 2019-03-17 MED ORDER — SODIUM CHLORIDE 0.9 % IV SOLN
2400.0000 mg/m2 | INTRAVENOUS | Status: DC
  Administered 2019-03-17: 16:00:00 4500 mg via INTRAVENOUS
  Filled 2019-03-17: qty 90

## 2019-03-17 MED ORDER — SODIUM CHLORIDE 0.9 % IV SOLN
5.0000 mg/kg | Freq: Once | INTRAVENOUS | Status: AC
  Administered 2019-03-17: 350 mg via INTRAVENOUS
  Filled 2019-03-17: qty 14

## 2019-03-17 MED ORDER — DEXTROSE 5 % IV SOLN
Freq: Once | INTRAVENOUS | Status: AC
  Administered 2019-03-17: 13:00:00 via INTRAVENOUS
  Filled 2019-03-17: qty 250

## 2019-03-17 MED ORDER — DEXAMETHASONE SODIUM PHOSPHATE 10 MG/ML IJ SOLN
10.0000 mg | Freq: Once | INTRAMUSCULAR | Status: AC
  Administered 2019-03-17: 12:00:00 10 mg via INTRAVENOUS

## 2019-03-17 MED ORDER — OXALIPLATIN CHEMO INJECTION 100 MG/20ML
60.0000 mg/m2 | Freq: Once | INTRAVENOUS | Status: AC
  Administered 2019-03-17: 13:00:00 110 mg via INTRAVENOUS
  Filled 2019-03-17: qty 20

## 2019-03-17 MED ORDER — PALONOSETRON HCL INJECTION 0.25 MG/5ML
0.2500 mg | Freq: Once | INTRAVENOUS | Status: AC
  Administered 2019-03-17: 12:00:00 0.25 mg via INTRAVENOUS

## 2019-03-17 NOTE — Telephone Encounter (Signed)
Scheduled appt per 4/9 los. °

## 2019-03-17 NOTE — Patient Instructions (Signed)
[facility] Discharge Instructions for Patients Receiving Chemotherapy  Today you received the following chemotherapy agents Oxaliplatin, Leucovorin, Fluorouracil.   To help prevent nausea and vomiting after your treatment, we encourage you to take your nausea medication as directed.  If you develop nausea and vomiting that is not controlled by your nausea medication, call the clinic.   BELOW ARE SYMPTOMS THAT SHOULD BE REPORTED IMMEDIATELY:  *FEVER GREATER THAN 100.5 F  *CHILLS WITH OR WITHOUT FEVER  NAUSEA AND VOMITING THAT IS NOT CONTROLLED WITH YOUR NAUSEA MEDICATION  *UNUSUAL SHORTNESS OF BREATH  *UNUSUAL BRUISING OR BLEEDING  TENDERNESS IN MOUTH AND THROAT WITH OR WITHOUT PRESENCE OF ULCERS  *URINARY PROBLEMS  *BOWEL PROBLEMS  UNUSUAL RASH Items with * indicate a potential emergency and should be followed up as soon as possible.  Feel free to call the clinic should you have any questions or concerns. The clinic phone number is [phone].  Please show the CHEMO ALERT CARD at check-in to the Emergency Department and triage nurse.   

## 2019-03-19 ENCOUNTER — Other Ambulatory Visit: Payer: Self-pay

## 2019-03-19 ENCOUNTER — Inpatient Hospital Stay: Payer: Medicare Other

## 2019-03-19 VITALS — BP 150/90 | HR 88 | Temp 98.4°F | Resp 16

## 2019-03-19 DIAGNOSIS — C20 Malignant neoplasm of rectum: Secondary | ICD-10-CM

## 2019-03-19 DIAGNOSIS — Z5111 Encounter for antineoplastic chemotherapy: Secondary | ICD-10-CM | POA: Diagnosis not present

## 2019-03-19 MED ORDER — SODIUM CHLORIDE 0.9% FLUSH
10.0000 mL | INTRAVENOUS | Status: DC | PRN
  Administered 2019-03-19: 13:00:00 10 mL
  Filled 2019-03-19: qty 10

## 2019-03-19 MED ORDER — HEPARIN SOD (PORK) LOCK FLUSH 100 UNIT/ML IV SOLN
500.0000 [IU] | Freq: Once | INTRAVENOUS | Status: AC | PRN
  Administered 2019-03-19: 500 [IU]
  Filled 2019-03-19: qty 5

## 2019-03-31 ENCOUNTER — Other Ambulatory Visit: Payer: Medicare Other

## 2019-03-31 ENCOUNTER — Ambulatory Visit: Payer: Medicare Other

## 2019-03-31 ENCOUNTER — Ambulatory Visit: Payer: Medicare Other | Admitting: Hematology

## 2019-04-07 ENCOUNTER — Inpatient Hospital Stay: Payer: Medicare Other

## 2019-04-07 ENCOUNTER — Inpatient Hospital Stay: Payer: Medicare Other | Admitting: Nurse Practitioner

## 2019-04-09 ENCOUNTER — Inpatient Hospital Stay: Payer: Medicare Other

## 2019-04-12 ENCOUNTER — Telehealth: Payer: Self-pay | Admitting: *Deleted

## 2019-04-12 NOTE — Telephone Encounter (Signed)
TCT pt's wife regarding his chemotherapy schedule.. Due to pt missing a treatment, it was necessary to re-arrange his schedule to accomdate his chemo and the pump d/c. Spoke with his wife and informed her of schedule changes, reminded her of upcoming CT scan and asked her to come by the cancer center to pick up his oral contrast before 04/18/19 (date of scans). Wife voiced understanding and was able to write the dates down and repeat back the dates, accurately.  Notified pharmacy of date changes.

## 2019-04-15 ENCOUNTER — Ambulatory Visit: Payer: Medicare Other | Admitting: Hematology

## 2019-04-15 ENCOUNTER — Ambulatory Visit: Payer: Medicare Other

## 2019-04-15 ENCOUNTER — Other Ambulatory Visit: Payer: Medicare Other

## 2019-04-18 ENCOUNTER — Ambulatory Visit
Admission: RE | Admit: 2019-04-18 | Discharge: 2019-04-18 | Disposition: A | Discharge status: Home / Self Care | Payer: Medicare Other | Source: Ambulatory Visit | Attending: Hematology | Admitting: Hematology

## 2019-04-18 ENCOUNTER — Other Ambulatory Visit: Payer: Self-pay

## 2019-04-18 DIAGNOSIS — C20 Malignant neoplasm of rectum: Secondary | ICD-10-CM

## 2019-04-18 MED ORDER — IOPAMIDOL (ISOVUE-300) INJECTION 61%
100.0000 mL | Freq: Once | INTRAVENOUS | Status: AC | PRN
  Administered 2019-04-18: 100 mL via INTRAVENOUS

## 2019-04-18 NOTE — Progress Notes (Signed)
[place]   Telephone:(336) [phone] Fax:(336) [phone]   Clinic Follow up Note   Patient Care Team: Wenda Low, MD as PCP - General (Internal Medicine)  Date of Service:  04/21/2019  CHIEF COMPLAINT: F/u on metastatic rectal cancer  SUMMARY OF ONCOLOGIC HISTORY: Oncology History   Cancer Staging Rectal cancer [facility]) Staging form: Colon and Rectum, AJCC 8th Edition - Clinical stage from 09/29/2017: Stage IVA (cTX, cNX, cM1a) - Signed by Truitt Merle, MD on 03/21/2018      Rectal cancer ([name])   09/29/2017 Procedure    Colonoscopy by Dr. Penelope Coop 09/29/17  Findings:  The perianal and digital rectal examination were normal.  A non-obstructing large mass was found in the proximal rectum and in the mid rectum.  The mass was circumferential.  This was biopsied with a cold forceps for histology.  Area was tattooed with an injection of [place] ink both distal and proximal to the mass.  There is about 4-5 cm of rectal mucosa distal to the mass that looks norma. This mass is about 7-8 cm in length.  A 10 mm polyp was found in the ascending colon. The polyp was sessile. the polyp was removed with a hot snare.  Resection and retrieval were complete.      09/29/2017 Initial Biopsy    FINAL DIAGNOSIS 09/29/17  1. LG Intestine - Ascending Colon Polyp:  TUBULAR ADENOMA 2. LG Intestine - Rectum, Bx: INVASIVE MODERATELY DIFFERENTIATED ADENOCARCINOMA. THE MSI TESTING IS PENDING AND THE RESULT WILL BE REPORTED AS AN ADDENDUM.    09/29/2017 Cancer Staging    Staging form: Colon and Rectum, AJCC 8th Edition - Clinical stage from 09/29/2017: Stage IVA (cTX, cNX, cM1a) - Signed by Truitt Merle, MD on 03/21/2018    09/29/2017 Miscellaneous    Initial Biopsy MMR Results: MSI Stable      10/21/2017 Initial Diagnosis    Rectal cancer ([place])    10/28/2017 Imaging    CT CAP W Contrast  IMPRESSION: 1. Long segment circumferential wall thickening of the rectosigmoid colon consistent  with known adenocarcinoma. No evidence of bowel obstruction or perforation. 2. No definite signs of metastatic disease in the abdomen or pelvis. Soft tissue nodularity in the right pelvis is probably due to an unopacified pelvic vein. 3. Multiple pulmonary nodules, some cavitary. These are not typical of metastatic adenocarcinoma (especially without evidence of abdominal disease). Consider multicentric or metastatic lung cancer. PET-CT may be helpful to assess hypermetabolic activity and guided tissue sampling. 4. Multiple bladder calculi consistent with chronic bladder outlet obstruction from prostatomegaly. Nonobstructing right renal calculus. No hydronephrosis. 5. Aortic Atherosclerosis (ICD10-I70.0). Additional incidental findings including a moderate size hiatal hernia and renal cysts.     11/05/2017 - 12/18/2017 Radiation Therapy    Radiation with Dr. Lisbeth Renshaw With Xeloda    11/09/2017 - 12/18/2017 Chemotherapy    Chemoradiation with Xeloda 850m/m2 q12h, M-F on days of radiation plan to starting 11/09/17    11/11/2017 Imaging    PET Scan  IMPRESSION:  Wall thickening/ mass involving the upper rectum, corresponding to the patient's known rectal adenocarcinoma. Bilateral pulmonary nodules, partially cavitary in the left upper lobe nodule, suspicious for metastases. Notably, cavitary metastases are more common with squamous cell carcinoma rather than the patient's known adenocarcinoma.     12/25/2018 Procedure    12/25/2018 Sigmoidoscopy Impression: - Malignant tumor in the proximal rectum, in the mid rectum and in the recto-sigmoid colon. Biopsied.      12/25/2018 Pathology Results  Diagnosis 12/25/18 Rectum, biopsy - ADENOCARCINOMA. Microscopic Comment    12/25/2018 Imaging    12/25/2018 CT AP IMPRESSION: 1. Wall thickening and enhancement of the rectum and distal sigmoid colon, consistent with known rectal carcinoma. 2. New hypoattenuating lesion in the right hepatic lobe,  concerning for metastatic disease. 3. Increased size of posterior right lung base mass, now measuring 5.4 x 3.8 cm, previously 1.8 x 2.0 cm. Multiple right lower lobe pulmonary nodules, worsened since 10/28/2017, and consistent with metastatic disease. 4. Multiple large calculi within the urinary bladder.    01/12/2019 Pathology Results    Diagnosis Lung, biopsy - METASTATIC ADENOCARCINOMA WITH NECROSIS, CONSISTENT WITH PATIENT'S CLINICAL HISTORY OF PRIMARY COLORECTAL CARCINOMA.    01/12/2019 Miscellaneous    Foundation One 01/12/19  MSI-stable  KRAS G12V MRAS Wildtype (Codones 12, 13, 59, 61, 117, and 146 in exons 2, 3 and 4) Tumor Mutational Burden - 5 Muts/Mb Alk R1192Q APC [id number]*0 APC Splice Site 4540+9W>J ASXL1 R1415* MEN1 MEN1([id number])  SF3B1 K666N TP53 R273C    01/20/2019 Imaging    CT Chest Wo Contrast  IMPRESSION: Multiple bilateral pulmonary metastases, including index lesions as above, progressive from 2018.  Aortic Atherosclerosis (ICD10-I70.0) and Emphysema (ICD10-J43.9).     01/20/2019 -  Chemotherapy    First line FOLFOX every 2 weeks       CURRENT THERAPY:  First lineFOLFOX every 2 weeks, starting 01/20/2019. Added Avastin with cycle 4  INTERVAL HISTORY:  Adrian Banks is here for a follow up and treatment. He presents to the clinic today by himself. He notes he is doing well. He feels his chemo break helped him. He notes with CT oral contrast he had diarrhea from contrast. He was going 3 times during the day and twice at night. He notes his stool in now like jello. He denies abdominal pain. He denies any neuropathy. He notes he does not want to continue living if his conditions declines and he is in vegetative state. He notes his makes his own decisions but will still discuss this visit and his options and choices with his wife and daughter.     REVIEW OF SYSTEMS:   Constitutional: Denies fevers, chills or abnormal weight loss Eyes: Denies  blurriness of vision Ears, nose, mouth, throat, and face: Denies mucositis or sore throat Respiratory: Denies cough, dyspnea or wheezes Cardiovascular: Denies palpitation, chest discomfort or lower extremity swelling Gastrointestinal:  Denies nausea, heartburn (+) Loose stool, improved diarrhea  Skin: Denies abnormal skin rashes Lymphatics: Denies new lymphadenopathy or easy bruising Neurological:Denies numbness, tingling or new weaknesses Behavioral/Psych: Mood is stable, no new changes  All other systems were reviewed with the patient and are negative.  MEDICAL HISTORY:  Past Medical History:  Diagnosis Date  . Cancer ([place]) 09/29/2017   Rectal cvancer  . High cholesterol     SURGICAL HISTORY: Past Surgical History:  Procedure Laterality Date  . BIOPSY  12/25/2018   Procedure: BIOPSY;  Surgeon: Wonda Horner, MD;  Location: [facility] ENDOSCOPY;  Service: Endoscopy;;  . FLEXIBLE SIGMOIDOSCOPY N/A 12/25/2018   Procedure: Beryle Quant;  Surgeon: Wonda Horner, MD;  Location: [facility] ENDOSCOPY;  Service: Endoscopy;  Laterality: N/A;  . IR IMAGING GUIDED PORT INSERTION  01/11/2019    I have reviewed the social history and family history with the patient and they are unchanged from previous note.  ALLERGIES:  has No Known Allergies.  MEDICATIONS:  Current Outpatient Medications  Medication Sig Dispense Refill  . diphenoxylate-atropine (LOMOTIL) 2.5-0.025 MG tablet  Take 1-2 tablets by mouth 4 (four) times daily as needed for diarrhea or loose stools. 60 tablet 1  . finasteride (PROSCAR) 5 MG tablet Take 5 mg by mouth daily.     Marland Kitchen lidocaine-prilocaine (EMLA) cream Apply to affected area once 30 g 3  . lovastatin (MEVACOR) 40 MG tablet Take 40 mg by mouth daily.     . naproxen sodium (ALEVE) 220 MG tablet Take 220 mg by mouth daily as needed (pain).    . ondansetron (ZOFRAN) 8 MG tablet Take 1 tablet (8 mg total) by mouth 2 (two) times daily as needed for refractory nausea / vomiting.  Start on day 3 after chemotherapy. 30 tablet 1  . prochlorperazine (COMPAZINE) 10 MG tablet Take 1 tablet (10 mg total) by mouth every 6 (six) hours as needed (Nausea or vomiting). 30 tablet 1   No current facility-administered medications for this visit.    Facility-Administered Medications Ordered in Other Visits  Medication Dose Route Frequency Provider Last Rate Last Dose  . 0.9 %  sodium chloride infusion   Intravenous Continuous Truitt Merle, MD   Stopped at 04/21/19 1317  . fluorouracil (ADRUCIL) 4,500 mg in sodium chloride 0.9 % 60 mL chemo infusion  2,400 mg/m2 (Treatment Plan Recorded) Intravenous 1 day or 1 dose Truitt Merle, MD   4,500 mg at 04/21/19 1544  . heparin lock flush 100 unit/mL  500 Units Intravenous Once Truitt Merle, MD        PHYSICAL EXAMINATION: ECOG PERFORMANCE STATUS: 1 - Symptomatic but completely ambulatory  Vitals:   04/21/19 1012  BP: (!) 152/85  Pulse: 73  Resp: 18  Temp: 97.9 F (36.6 C)  SpO2: 98%   Filed Weights   04/21/19 1012  Weight: 151 lb 11.2 oz (68.8 kg)    GENERAL:alert, no distress and comfortable SKIN: skin color, texture, turgor are normal, no rashes or significant lesions EYES: normal, Conjunctiva are pink and non-injected, sclera clear OROPHARYNX:no exudate, no erythema and lips, buccal mucosa, and tongue normal  NECK: supple, thyroid normal size, non-tender, without nodularity LYMPH:  no palpable lymphadenopathy in the cervical, axillary or inguinal LUNGS: clear to auscultation and percussion with normal breathing effort HEART: regular rate & rhythm and no murmurs and no lower extremity edema ABDOMEN:abdomen soft, non-tender and normal bowel sounds Musculoskeletal:no cyanosis of digits and no clubbing  NEURO: alert & oriented x 3 with fluent speech, no focal motor/sensory deficits  LABORATORY DATA:  I have reviewed the data as listed CBC Latest Ref Rng & Units 04/21/2019 03/17/2019 03/03/2019  WBC 4.0 - 10.5 K/uL 6.7 4.6 5.7   Hemoglobin 13.0 - 17.0 g/dL 13.4 12.7(L) 12.0(L)  Hematocrit 39.0 - 52.0 % 41.6 38.4(L) 38.0(L)  Platelets 150 - 400 K/uL 159 159 131(L)     CMP Latest Ref Rng & Units 04/21/2019 03/17/2019 03/03/2019  Glucose 70 - 99 mg/dL 97 93 97  BUN 8 - 23 mg/dL 20 19 19   Creatinine 0.61 - 1.24 mg/dL 0.96 0.90 1.09  Sodium 135 - 145 mmol/L 141 142 142  Potassium 3.5 - 5.1 mmol/L 3.6 3.9 3.9  Chloride 98 - 111 mmol/L 108 110 108  CO2 22 - 32 mmol/L 25 22 25   Calcium 8.9 - 10.3 mg/dL 9.2 8.8(L) 8.9  Total Protein 6.5 - 8.1 g/dL 6.9 6.6 6.4(L)  Total Bilirubin 0.3 - 1.2 mg/dL 0.5 0.4 0.5  Alkaline Phos 38 - 126 U/L 74 71 70  AST 15 - 41 U/L 17 23 34  ALT  0 - 44 U/L 11 18 37      RADIOGRAPHIC STUDIES: I have personally reviewed the radiological images as listed and agreed with the findings in the report. No results found.   ASSESSMENT & PLAN:  Adrian Banks is a 85 y.o. male with   1. Rectal Cancer, invasive adenocarcinoma,with lung and liver metastasis, cTxNxM1, stage IV, MSS, KRAS mutation(+) -Diagnosed in 09/2017.His initial staging scans showedbilateral lung nodules, highly suspicious for lung metastasis.He declined biopsy of his lung lesions. He was treatedwith radiation andoralchemoXelodato his primary rectal cancer.I last saw him in 03/2018 and he lost f/u since then.His 12/25/18 rectal biopsy and2/5/20 Lung biopsy has confirmedrectal caner recurrence with lungmetastasis. -He started FOLFOX every 2 weeks on 01/20/19.Due to his advanced age, I reduced chemo dose. He tolerated well. I added biological agent Avastin starting with cycle 4.  -His FO results shows MSI-stable. KRAS mutation. He is not eligible for immunotherapy or EGFR inhibitor. -I previously discussed he will continue chemo for as long as he can tolerate and it controls his disease. I explained if he stops treatment his cancer will grow. If he remains stable on treatment in the future we can switch him to maintenance  therapy. We can give chemo breaks as needed. He understands. -After chemo break he is clinically doing better, diarrhea improved, now loose stool, less frequently.   -CT CAP from 04/18/19 shows good response to chemo therapy to liver and lung, no new or progressive disease. I reviewed the images with pt in person. Given the overall excellent response to chemo and moderate to good tolerance, I recommend her to continue dose reduced FOLFOX and Avastin for additional 2-3 months, and then switch to 5-fu or Xeloda and Avastin maintenance therapy.  -Patient appears to have cognitive dysfunction, I do not think he understands the plan completely, initially he said he would not want to continue chemo despite I asked and encouraged him repeatly, but he then told my nurse that he wants to continue chemo. I called his wife and got consent from his wife also.  -Labs reviewed, CBC WNL, CMP WNL. Iron panel and CEA are still pending. Overall adequate to proceed with cycle 6 FOLFOX/Avastin today. -f/u in 2 weeks   2. Anemia,secondary to rectal bleeding andiron deficiency -new sinceearly 2019  -probably related to his rectal cancer bleeding -will check iron studyperiodically. -S/p2doses of IV iron recently,he responded well.  -Anemia resolved today, iron panel is still pending. (04/21/19)  3. Diarrhea, Rectal Bleeding  -Secondary to rectal cancer recurrence  -Hematochezia has resolved -Diarrhea has improved, now loose stool. He has bowel movements 3 times during the day and 2 times at night.  -Continue lomotil to 2 in the morning or in the evening and use it as needed in between, up to 8 tabs a day.   4.Goal of care discussion  -We again discussed the incurable nature of his cancer, and the overall poor prognosis, especially if he does not have good response to chemotherapy or progresses.  -The patient understands the goal of care is palliative. -I recommend DNR/DNI, he is agreeable with DNR/DNI. I  advised him to discuss this with his wife and daughter.   5. Dementia  -Pt has appeared to have some degree of cognitive dysfunction since a saw him the first time, this is much worse now, I think he needs further evaluation  -I called his wife and discussed with her. She states pt does not talk much at home, he functions well,  she have not noticed any change of his memory or behavioral issues.  -will also call his daughter and inform her, and I will encourage his family to bring him to see his PCP for dementia evaluation     Plan -CT CAP reviewed with patient and his wife, he has had good partial response to his chemo  -Patient initially declined chemotherapy, later on agreed, I also spoke his wife who agrees with chemo  -lab reviewed, treatment, will proceed cycle 6 FOLFOX and Avastin today -Lab, flush, follow-up and chemo in 2 weeks    No problem-specific Assessment & Plan notes found for this encounter.   No orders of the defined types were placed in this encounter.  All questions were answered. The patient knows to call the clinic with any problems, questions or concerns. No barriers to learning was detected. I spent 30 minutes counseling the patient face to face. The total time spent in the appointment was 45 minutes and more than 50% was on counseling and review of test results     Truitt Merle, MD 04/21/2019   I, Joslyn Devon, am acting as scribe for Truitt Merle, MD.   I have reviewed the above documentation for accuracy and completeness, and I agree with the above.

## 2019-04-21 ENCOUNTER — Telehealth: Payer: Self-pay | Admitting: Hematology

## 2019-04-21 ENCOUNTER — Inpatient Hospital Stay: Payer: Medicare Other | Attending: Hematology

## 2019-04-21 ENCOUNTER — Inpatient Hospital Stay: Payer: Medicare Other

## 2019-04-21 ENCOUNTER — Other Ambulatory Visit: Payer: Self-pay

## 2019-04-21 ENCOUNTER — Encounter: Payer: Self-pay | Admitting: Hematology

## 2019-04-21 ENCOUNTER — Inpatient Hospital Stay: Payer: Medicare Other | Admitting: Hematology

## 2019-04-21 VITALS — BP 152/85 | HR 73 | Temp 97.9°F | Resp 18 | Ht 70.0 in | Wt 151.7 lb

## 2019-04-21 DIAGNOSIS — Z79899 Other long term (current) drug therapy: Secondary | ICD-10-CM

## 2019-04-21 DIAGNOSIS — Z95828 Presence of other vascular implants and grafts: Secondary | ICD-10-CM

## 2019-04-21 DIAGNOSIS — D500 Iron deficiency anemia secondary to blood loss (chronic): Secondary | ICD-10-CM

## 2019-04-21 DIAGNOSIS — C7802 Secondary malignant neoplasm of left lung: Secondary | ICD-10-CM

## 2019-04-21 DIAGNOSIS — C787 Secondary malignant neoplasm of liver and intrahepatic bile duct: Secondary | ICD-10-CM

## 2019-04-21 DIAGNOSIS — D509 Iron deficiency anemia, unspecified: Secondary | ICD-10-CM

## 2019-04-21 DIAGNOSIS — C20 Malignant neoplasm of rectum: Secondary | ICD-10-CM

## 2019-04-21 DIAGNOSIS — F0390 Unspecified dementia without behavioral disturbance: Secondary | ICD-10-CM

## 2019-04-21 DIAGNOSIS — Z5111 Encounter for antineoplastic chemotherapy: Secondary | ICD-10-CM | POA: Diagnosis not present

## 2019-04-21 DIAGNOSIS — Z923 Personal history of irradiation: Secondary | ICD-10-CM

## 2019-04-21 DIAGNOSIS — R197 Diarrhea, unspecified: Secondary | ICD-10-CM | POA: Insufficient documentation

## 2019-04-21 LAB — CBC WITH DIFFERENTIAL/PLATELET
Abs Immature Granulocytes: 0.01 10*3/uL (ref 0.00–0.07)
Basophils Absolute: 0.1 10*3/uL (ref 0.0–0.1)
Basophils Relative: 2 %
Eosinophils Absolute: 0.3 10*3/uL (ref 0.0–0.5)
Eosinophils Relative: 4 %
HCT: 41.6 % (ref 39.0–52.0)
Hemoglobin: 13.4 g/dL (ref 13.0–17.0)
Immature Granulocytes: 0 %
Lymphocytes Relative: 13 %
Lymphs Abs: 0.8 10*3/uL (ref 0.7–4.0)
MCH: 28.9 pg (ref 26.0–34.0)
MCHC: 32.2 g/dL (ref 30.0–36.0)
MCV: 89.8 fL (ref 80.0–100.0)
Monocytes Absolute: 0.8 10*3/uL (ref 0.1–1.0)
Monocytes Relative: 12 %
Neutro Abs: 4.7 10*3/uL (ref 1.7–7.7)
Neutrophils Relative %: 69 %
Platelets: 159 10*3/uL (ref 150–400)
RBC: 4.63 MIL/uL (ref 4.22–5.81)
RDW: 19.6 % — ABNORMAL HIGH (ref 11.5–15.5)
WBC: 6.7 10*3/uL (ref 4.0–10.5)
nRBC: 0 % (ref 0.0–0.2)

## 2019-04-21 LAB — IRON AND TIBC
Iron: 70 ug/dL (ref 42–163)
Saturation Ratios: 21 % (ref 20–55)
TIBC: 324 ug/dL (ref 202–409)
UIBC: 254 ug/dL (ref 117–376)

## 2019-04-21 LAB — COMPREHENSIVE METABOLIC PANEL
ALT: 11 U/L (ref 0–44)
AST: 17 U/L (ref 15–41)
Albumin: 3.4 g/dL — ABNORMAL LOW (ref 3.5–5.0)
Alkaline Phosphatase: 74 U/L (ref 38–126)
Anion gap: 8 (ref 5–15)
BUN: 20 mg/dL (ref 8–23)
CO2: 25 mmol/L (ref 22–32)
Calcium: 9.2 mg/dL (ref 8.9–10.3)
Chloride: 108 mmol/L (ref 98–111)
Creatinine, Ser: 0.96 mg/dL (ref 0.61–1.24)
GFR calc Af Amer: >60 mL/min (ref >60)
GFR calc non Af Amer: >60 mL/min (ref >60)
Glucose, Bld: 97 mg/dL (ref 70–99)
Potassium: 3.6 mmol/L (ref 3.5–5.1)
Sodium: 141 mmol/L (ref 135–145)
Total Bilirubin: 0.5 mg/dL (ref 0.3–1.2)
Total Protein: 6.9 g/dL (ref 6.5–8.1)

## 2019-04-21 LAB — CEA (IN HOUSE-CHCC): CEA (CHCC-In House): 21.68 ng/mL — ABNORMAL HIGH (ref 0.00–5.00)

## 2019-04-21 LAB — FERRITIN: Ferritin: 32 ng/mL (ref 24–336)

## 2019-04-21 MED ORDER — DEXTROSE 5 % IV SOLN
Freq: Once | INTRAVENOUS | Status: AC
  Administered 2019-04-21: 12:00:00 via INTRAVENOUS
  Filled 2019-04-21: qty 250

## 2019-04-21 MED ORDER — SODIUM CHLORIDE 0.9% FLUSH
10.0000 mL | Freq: Once | INTRAVENOUS | Status: AC
  Administered 2019-04-21: 10 mL
  Filled 2019-04-21: qty 10

## 2019-04-21 MED ORDER — DEXAMETHASONE SODIUM PHOSPHATE 10 MG/ML IJ SOLN
10.0000 mg | Freq: Once | INTRAMUSCULAR | Status: AC
  Administered 2019-04-21: 10 mg via INTRAVENOUS

## 2019-04-21 MED ORDER — SODIUM CHLORIDE 0.9 % IV SOLN
2400.0000 mg/m2 | INTRAVENOUS | Status: DC
  Administered 2019-04-21: 4500 mg via INTRAVENOUS
  Filled 2019-04-21: qty 90

## 2019-04-21 MED ORDER — HEPARIN SOD (PORK) LOCK FLUSH 100 UNIT/ML IV SOLN
500.0000 [IU] | Freq: Once | INTRAVENOUS | Status: AC
  Filled 2019-04-21: qty 5

## 2019-04-21 MED ORDER — LEUCOVORIN CALCIUM INJECTION 350 MG
400.0000 mg/m2 | Freq: Once | INTRAVENOUS | Status: AC
  Administered 2019-04-21: 13:00:00 748 mg via INTRAVENOUS
  Filled 2019-04-21: qty 37.4

## 2019-04-21 MED ORDER — DEXAMETHASONE SODIUM PHOSPHATE 10 MG/ML IJ SOLN
INTRAMUSCULAR | Status: AC
  Filled 2019-04-21: qty 1

## 2019-04-21 MED ORDER — PALONOSETRON HCL INJECTION 0.25 MG/5ML
INTRAVENOUS | Status: AC
  Filled 2019-04-21: qty 5

## 2019-04-21 MED ORDER — SODIUM CHLORIDE 0.9 % IV SOLN
5.0000 mg/kg | Freq: Once | INTRAVENOUS | Status: AC
  Administered 2019-04-21: 350 mg via INTRAVENOUS
  Filled 2019-04-21: qty 14

## 2019-04-21 MED ORDER — PALONOSETRON HCL INJECTION 0.25 MG/5ML
0.2500 mg | Freq: Once | INTRAVENOUS | Status: AC
  Administered 2019-04-21: 0.25 mg via INTRAVENOUS

## 2019-04-21 MED ORDER — SODIUM CHLORIDE 0.9 % IV SOLN
INTRAVENOUS | Status: DC
  Administered 2019-04-21: 13:00:00 via INTRAVENOUS
  Filled 2019-04-21: qty 250

## 2019-04-21 MED ORDER — OXALIPLATIN CHEMO INJECTION 100 MG/20ML
60.0000 mg/m2 | Freq: Once | INTRAVENOUS | Status: AC
  Administered 2019-04-21: 110 mg via INTRAVENOUS
  Filled 2019-04-21: qty 20

## 2019-04-21 NOTE — Patient Instructions (Signed)
[facility] Discharge Instructions for Patients Receiving Chemotherapy  Today you received the following chemotherapy agents Bevacizumab (AVASTIN), Oxaliplatin (ELOXATIN), Leucovorin & Flourouracil (ADRUCIL).  To help prevent nausea and vomiting after your treatment, we encourage you to take your nausea medication as prescribed.   If you develop nausea and vomiting that is not controlled by your nausea medication, call the clinic.   BELOW ARE SYMPTOMS THAT SHOULD BE REPORTED IMMEDIATELY:  *FEVER GREATER THAN 100.5 F  *CHILLS WITH OR WITHOUT FEVER  NAUSEA AND VOMITING THAT IS NOT CONTROLLED WITH YOUR NAUSEA MEDICATION  *UNUSUAL SHORTNESS OF BREATH  *UNUSUAL BRUISING OR BLEEDING  TENDERNESS IN MOUTH AND THROAT WITH OR WITHOUT PRESENCE OF ULCERS  *URINARY PROBLEMS  *BOWEL PROBLEMS  UNUSUAL RASH Items with * indicate a potential emergency and should be followed up as soon as possible.  Feel free to call the clinic should you have any questions or concerns. The clinic phone number is [phone].  Please show the [facility] at check-in to the Emergency Department and triage nurse.  Coronavirus (COVID-19) Are you at risk?  Are you at risk for the Coronavirus (COVID-19)?  To be considered HIGH RISK for Coronavirus (COVID-19), you have to meet the following criteria:  . Traveled to [place], [place], [place], [place] or [place]; or in the [place] to [place], [place], [place], or [place]; and have fever, cough, and shortness of breath within the last 2 weeks of travel OR . Been in close contact with a person diagnosed with COVID-19 within the last 2 weeks and have fever, cough, and shortness of breath . IF YOU DO NOT MEET THESE CRITERIA, YOU ARE CONSIDERED LOW RISK FOR COVID-19.  What to do if you are HIGH RISK for COVID-19?  Marland Kitchen If you are having a medical emergency, call 911. . Seek medical care right away. Before you go to a doctor's office,  urgent care or emergency department, call ahead and tell them about your recent travel, contact with someone diagnosed with COVID-19, and your symptoms. You should receive instructions from your physician's office regarding next steps of care.  . When you arrive at healthcare provider, tell the healthcare staff immediately you have returned from visiting [place], [place], [place], [place] or [place]; or traveled in the [place] to [place], [place], [place], or [place]; in the last two weeks or you have been in close contact with a person diagnosed with COVID-19 in the last 2 weeks.   . Tell the health care staff about your symptoms: fever, cough and shortness of breath. . After you have been seen by a medical provider, you will be either: o Tested for (COVID-19) and discharged home on quarantine except to seek medical care if symptoms worsen, and asked to  - Stay home and avoid contact with others until you get your results (4-5 days)  - Avoid travel on public transportation if possible (such as bus, train, or airplane) or o Sent to the Emergency Department by EMS for evaluation, COVID-19 testing, and possible admission depending on your condition and test results.  What to do if you are LOW RISK for COVID-19?  Reduce your risk of any infection by using the same precautions used for avoiding the common cold or flu:  Marland Kitchen Wash your hands often with soap and warm water for at least 20 seconds.  If soap and water are not readily available, use an alcohol-based hand sanitizer with at least 60%  alcohol.  . If coughing or sneezing, cover your mouth and nose by coughing or sneezing into the elbow areas of your shirt or coat, into a tissue or into your sleeve (not your hands). . Avoid shaking hands with others and consider head nods or verbal greetings only. . Avoid touching your eyes, nose, or mouth with unwashed hands.  . Avoid close contact with people who are sick. . Avoid places or events with  large numbers of people in one location, like concerts or sporting events. . Carefully consider travel plans you have or are making. . If you are planning any travel outside or inside the [place], visit the CDC's Travelers' Health webpage for the latest health notices. . If you have some symptoms but not all symptoms, continue to monitor at home and seek medical attention if your symptoms worsen. . If you are having a medical emergency, call 911.   ADDITIONAL HEALTHCARE OPTIONS FOR PATIENTS  [facility] / e-Visit: [email or web address]         [facility]: [phone]  [facility]: [phone]                   [facility]: [phone]   

## 2019-04-21 NOTE — Progress Notes (Signed)
Patient has no appointment for Pump D/C. Message sent to scheduling for Pump Stop appointment on Saturday 04/23/2019.

## 2019-04-21 NOTE — Telephone Encounter (Signed)
Scheduled appt per 5/14 los. ° °A calendar will be mailed out. °

## 2019-04-21 NOTE — Progress Notes (Signed)
AVS and Schedule provided to patient. Patient knows to come in on Saturday 04/23/2019 at 1:30 PM for his pump stop.

## 2019-04-21 NOTE — Progress Notes (Signed)
Per Dr. Burr Medico, okay to treat with Urine Protein of 03/03/2019.

## 2019-04-23 ENCOUNTER — Other Ambulatory Visit: Payer: Self-pay

## 2019-04-23 ENCOUNTER — Inpatient Hospital Stay: Payer: Medicare Other

## 2019-04-23 VITALS — BP 153/91 | HR 77 | Temp 98.2°F | Resp 16

## 2019-04-23 DIAGNOSIS — C20 Malignant neoplasm of rectum: Secondary | ICD-10-CM

## 2019-04-23 DIAGNOSIS — Z5111 Encounter for antineoplastic chemotherapy: Secondary | ICD-10-CM | POA: Diagnosis not present

## 2019-04-23 MED ORDER — HEPARIN SOD (PORK) LOCK FLUSH 100 UNIT/ML IV SOLN
500.0000 [IU] | Freq: Once | INTRAVENOUS | Status: AC | PRN
  Administered 2019-04-23: 500 [IU]
  Filled 2019-04-23: qty 5

## 2019-04-23 MED ORDER — SODIUM CHLORIDE 0.9% FLUSH
10.0000 mL | INTRAVENOUS | Status: DC | PRN
  Administered 2019-04-23: 10 mL
  Filled 2019-04-23: qty 10

## 2019-04-23 NOTE — Patient Instructions (Signed)

## 2019-05-03 NOTE — Progress Notes (Signed)
Adrian Banks   [phone]) [phone] Fax:([phone]) [phone]   Clinic Follow up Note   Patient Care Team: Wenda Low, MD as PCP - General (Internal Medicine) 05/05/2019  CHIEF COMPLAINT: f/u metastatic rectal cancer   SUMMARY OF ONCOLOGIC HISTORY: Oncology History   Cancer Staging Rectal cancer [facility]) Staging form: Colon and Rectum, AJCC 8th Edition - Clinical stage from 09/29/2017: Stage IVA (cTX, cNX, cM1a) - Signed by Truitt Merle, MD on 03/21/2018      Rectal cancer ([place])   09/29/2017 Procedure    Colonoscopy by Dr. Penelope Coop 09/29/17  Findings:  The perianal and digital rectal examination were normal.  A non-obstructing large mass was found in the proximal rectum and in the mid rectum.  The mass was circumferential.  This was biopsied with a cold forceps for histology.  Area was tattooed with an injection of [place] ink both distal and proximal to the mass.  There is about 4-5 cm of rectal mucosa distal to the mass that looks norma. This mass is about 7-8 cm in length.  A 10 mm polyp was found in the ascending colon. The polyp was sessile. the polyp was removed with a hot snare.  Resection and retrieval were complete.      09/29/2017 Initial Biopsy    FINAL DIAGNOSIS 09/29/17  1. LG Intestine - Ascending Colon Polyp:  TUBULAR ADENOMA 2. LG Intestine - Rectum, Bx: INVASIVE MODERATELY DIFFERENTIATED ADENOCARCINOMA. THE MSI TESTING IS PENDING AND THE RESULT WILL BE REPORTED AS AN ADDENDUM.    09/29/2017 Cancer Staging    Staging form: Colon and Rectum, AJCC 8th Edition - Clinical stage from 09/29/2017: Stage IVA (cTX, cNX, cM1a) - Signed by Truitt Merle, MD on 03/21/2018    09/29/2017 Miscellaneous    Initial Biopsy MMR Results: MSI Stable      10/21/2017 Initial Diagnosis    Rectal cancer ([place])    10/28/2017 Imaging    CT CAP W Contrast  IMPRESSION: 1. Long segment circumferential wall thickening of the rectosigmoid colon consistent with known adenocarcinoma.  No evidence of bowel obstruction or perforation. 2. No definite signs of metastatic disease in the abdomen or pelvis. Soft tissue nodularity in the right pelvis is probably due to an unopacified pelvic vein. 3. Multiple pulmonary nodules, some cavitary. These are not typical of metastatic adenocarcinoma (especially without evidence of abdominal disease). Consider multicentric or metastatic lung cancer. PET-CT may be helpful to assess hypermetabolic activity and guided tissue sampling. 4. Multiple bladder calculi consistent with chronic bladder outlet obstruction from prostatomegaly. Nonobstructing right renal calculus. No hydronephrosis. 5. Aortic Atherosclerosis (ICD10-I70.0). Additional incidental findings including a moderate size hiatal hernia and renal cysts.     11/05/2017 - 12/18/2017 Radiation Therapy    Radiation with Dr. Lisbeth Renshaw With Xeloda    11/09/2017 - 12/18/2017 Chemotherapy    Chemoradiation with Xeloda '825mg'$ /m2 q12h, M-F on days of radiation plan to starting 11/09/17    11/11/2017 Imaging    PET Scan  IMPRESSION:  Wall thickening/ mass involving the upper rectum, corresponding to the patient's known rectal adenocarcinoma. Bilateral pulmonary nodules, partially cavitary in the left upper lobe nodule, suspicious for metastases. Notably, cavitary metastases are more common with squamous cell carcinoma rather than the patient's known adenocarcinoma.     12/25/2018 Procedure    12/25/2018 Sigmoidoscopy Impression: - Malignant tumor in the proximal rectum, in the mid rectum and in the recto-sigmoid colon. Biopsied.      12/25/2018 Pathology Results    Diagnosis 12/25/18 Rectum, biopsy -  ADENOCARCINOMA. Microscopic Comment    12/25/2018 Imaging    12/25/2018 CT AP IMPRESSION: 1. Wall thickening and enhancement of the rectum and distal sigmoid colon, consistent with known rectal carcinoma. 2. New hypoattenuating lesion in the right hepatic lobe, concerning for metastatic  disease. 3. Increased size of posterior right lung base mass, now measuring 5.4 x 3.8 cm, previously 1.8 x 2.0 cm. Multiple right lower lobe pulmonary nodules, worsened since 10/28/2017, and consistent with metastatic disease. 4. Multiple large calculi within the urinary bladder.    01/12/2019 Pathology Results    Diagnosis Lung, biopsy - METASTATIC ADENOCARCINOMA WITH NECROSIS, CONSISTENT WITH PATIENT'S CLINICAL HISTORY OF PRIMARY COLORECTAL CARCINOMA.    01/12/2019 Miscellaneous    Foundation One 01/12/19  MSI-stable  KRAS G12V MRAS Wildtype (Codones 12, 13, 59, 61, 117, and 146 in exons 2, 3 and 4) Tumor Mutational Burden - 5 Muts/Mb Alk R1192Q APC R518AC*1 APC Splice Site 6606+3K>Z ASXL1 R1415* MEN1 MEN1([id number])  SF3B1 K666N TP53 R273C    01/20/2019 Imaging    CT Chest Wo Contrast  IMPRESSION: Multiple bilateral pulmonary metastases, including index lesions as above, progressive from 2018.  Aortic Atherosclerosis (ICD10-I70.0) and Emphysema (ICD10-J43.9).     01/20/2019 -  Chemotherapy    First line FOLFOX every 2 weeks      CURRENT THERAPY: First lineFOLFOX every 2 weeks, starting 01/20/2019. Added Avastin with cycle 4  INTERVAL HISTORY: Mr. Cain returns for follow up and treatment as scheduled. He completed cycle 6 FOLFOX and avastin on 04/21/19. He feels weak and off balance while 5FU pump infuses, improves 2-3 days after. He notes this week is the best he's felt since January. Denies dizziness on standing. Denies fall. He does exercises at home. Occasional dyspnea on exertion. Eating and drinking adequately. Has loose stool like "jelly" daily, 5-6 times in 24 hour period. Takes lomotil 1-2 tabs up to 4 times daily. Reserves 2 tabs for heavier stool. Denies n/v or blood in stool. Denies pain.  Denies mucositis, cough, chest pain, swelling, neuropathy, or cold sensitivity.     MEDICAL HISTORY:  Past Medical History:  Diagnosis Date  . Cancer ([place]) 09/29/2017    Rectal cvancer  . High cholesterol     SURGICAL HISTORY: Past Surgical History:  Procedure Laterality Date  . BIOPSY  12/25/2018   Procedure: BIOPSY;  Surgeon: Wonda Horner, MD;  Location: [facility] ENDOSCOPY;  Service: Endoscopy;;  . FLEXIBLE SIGMOIDOSCOPY N/A 12/25/2018   Procedure: Beryle Quant;  Surgeon: Wonda Horner, MD;  Location: [facility] ENDOSCOPY;  Service: Endoscopy;  Laterality: N/A;  . IR IMAGING GUIDED PORT INSERTION  01/11/2019    I have reviewed the social history and family history with the patient and they are unchanged from previous note.  ALLERGIES:  has No Known Allergies.  MEDICATIONS:  Current Outpatient Medications  Medication Sig Dispense Refill  . diphenoxylate-atropine (LOMOTIL) 2.5-0.025 MG tablet Take 1-2 tablets by mouth 4 (four) times daily as needed for diarrhea or loose stools. 60 tablet 1  . finasteride (PROSCAR) 5 MG tablet Take 5 mg by mouth daily.     Marland Kitchen lidocaine-prilocaine (EMLA) cream Apply to affected area once 30 g 3  . lovastatin (MEVACOR) 40 MG tablet Take 40 mg by mouth daily.     . naproxen sodium (ALEVE) 220 MG tablet Take 220 mg by mouth daily as needed (pain).    . ondansetron (ZOFRAN) 8 MG tablet Take 1 tablet (8 mg total) by mouth 2 (two) times daily as needed for  refractory nausea / vomiting. Start on day 3 after chemotherapy. 30 tablet 1  . prochlorperazine (COMPAZINE) 10 MG tablet Take 1 tablet (10 mg total) by mouth every 6 (six) hours as needed (Nausea or vomiting). 30 tablet 1   No current facility-administered medications for this visit.    Facility-Administered Medications Ordered in Other Visits  Medication Dose Route Frequency Provider Last Rate Last Dose  . heparin lock flush 100 unit/mL  500 Units Intravenous Once Truitt Merle, MD        PHYSICAL EXAMINATION: ECOG PERFORMANCE STATUS: 2 - Symptomatic, <50% confined to bed  Vitals:   05/05/19 0920 05/05/19 0925  BP: (!) 175/85 132/88  Pulse: 78   Resp: 18   Temp: 98.9 F  (37.2 C)   SpO2: 97%    Filed Weights   05/05/19 0920  Weight: 151 lb 14.4 oz (68.9 kg)    GENERAL:alert, no distress and comfortable SKIN: no obvious rash  EYES:  sclera clear LUNGS: respirations even and unlabored  HEART:  no lower extremity edema Musculoskeletal:no cyanosis of digits NEURO: alert & oriented x 3 with fluent speech, normal gait  PAC without erythema  Limited exam for covid19 outbreak   LABORATORY DATA:  I have reviewed the data as listed CBC Latest Ref Rng & Units 05/05/2019 04/21/2019 03/17/2019  WBC 4.0 - 10.5 K/uL 5.9 6.7 4.6  Hemoglobin 13.0 - 17.0 g/dL 13.6 13.4 12.7(L)  Hematocrit 39.0 - 52.0 % 41.9 41.6 38.4(L)  Platelets 150 - 400 K/uL 183 159 159     CMP Latest Ref Rng & Units 05/05/2019 04/21/2019 03/17/2019  Glucose 70 - 99 mg/dL 103(H) 97 93  BUN 8 - 23 mg/dL 20 20 19   Creatinine 0.61 - 1.24 mg/dL 1.08 0.96 0.90  Sodium 135 - 145 mmol/L 144 141 142  Potassium 3.5 - 5.1 mmol/L 3.5 3.6 3.9  Chloride 98 - 111 mmol/L 111 108 110  CO2 22 - 32 mmol/L 23 25 22   Calcium 8.9 - 10.3 mg/dL 9.2 9.2 8.8(L)  Total Protein 6.5 - 8.1 g/dL 6.8 6.9 6.6  Total Bilirubin 0.3 - 1.2 mg/dL 0.5 0.5 0.4  Alkaline Phos 38 - 126 U/L 70 74 71  AST 15 - 41 U/L 21 17 23   ALT 0 - 44 U/L 14 11 18       RADIOGRAPHIC STUDIES: I have personally reviewed the radiological images as listed and agreed with the findings in the report. No results found.   ASSESSMENT & PLAN: MARON STANZIONE is a 85 y.o. male with   1. Rectal Cancer, invasive adenocarcinoma,with lung and liver metastasis, cTxNxM1, stage IV, MSS, KRAS mutation(+) 2. Anemia,secondary to rectal bleeding andiron deficiency; s/p IV iron with good response  3. Diarrhea, Rectal Bleeding secondary to rectal cancer recurrence  4.Goal of care discussion - agreeable with DNR/DNI per previous discussion with Dr. Burr Medico 5. Dementia, no formal work up   Mr. Crutchfield appears stable. He completed cycle 6 dose-reduced FOLFOX  and avastin. He tolerated last cycle moderately well with mild weakness and balance issue for 2-3 days after pump d/c. I encouraged him to maintain adequate hydration and use cane/walker at home. Bowel habits at baseline, will refill lomotil. He has recovered well today. CBC and CMP adequate for treatment. VS and weight are stable.   He continues to seem ambivalent about treatment, but agrees to proceed with cycle 7 today. He does not want me to call his family to update. We discussed pushing next treatment to 3  weeks, but he ultimately wishes to keep his schedule as is for now. He will return in 2 weeks for f/u with Dr. Burr Medico, she will discuss his treatment plan at that time.   Plan -Labs reviewed -Proceed with cycle 7 FOLFOX and avastin, no dosage adjustments -Refill lomotil -F/u in 2 weeks with Dr. Burr Medico   All questions were answered. The patient knows to call the clinic with any problems, questions or concerns. No barriers to learning was detected.    Alla Feeling, NP 05/05/19

## 2019-05-05 ENCOUNTER — Ambulatory Visit: Payer: Medicare Other

## 2019-05-05 ENCOUNTER — Inpatient Hospital Stay: Payer: Medicare Other

## 2019-05-05 ENCOUNTER — Inpatient Hospital Stay: Payer: Medicare Other | Admitting: Nurse Practitioner

## 2019-05-05 ENCOUNTER — Encounter: Payer: Self-pay | Admitting: Nurse Practitioner

## 2019-05-05 ENCOUNTER — Other Ambulatory Visit: Payer: Medicare Other

## 2019-05-05 ENCOUNTER — Ambulatory Visit: Payer: Medicare Other | Admitting: Hematology

## 2019-05-05 ENCOUNTER — Other Ambulatory Visit: Payer: Self-pay

## 2019-05-05 VITALS — BP 132/88 | HR 78 | Temp 98.9°F | Resp 18 | Ht 70.0 in | Wt 151.9 lb

## 2019-05-05 DIAGNOSIS — Z5111 Encounter for antineoplastic chemotherapy: Secondary | ICD-10-CM | POA: Diagnosis not present

## 2019-05-05 DIAGNOSIS — C20 Malignant neoplasm of rectum: Secondary | ICD-10-CM | POA: Diagnosis not present

## 2019-05-05 DIAGNOSIS — C787 Secondary malignant neoplasm of liver and intrahepatic bile duct: Secondary | ICD-10-CM | POA: Diagnosis not present

## 2019-05-05 DIAGNOSIS — C7802 Secondary malignant neoplasm of left lung: Secondary | ICD-10-CM

## 2019-05-05 DIAGNOSIS — Z923 Personal history of irradiation: Secondary | ICD-10-CM

## 2019-05-05 DIAGNOSIS — Z79899 Other long term (current) drug therapy: Secondary | ICD-10-CM

## 2019-05-05 DIAGNOSIS — D509 Iron deficiency anemia, unspecified: Secondary | ICD-10-CM | POA: Diagnosis not present

## 2019-05-05 DIAGNOSIS — F0390 Unspecified dementia without behavioral disturbance: Secondary | ICD-10-CM

## 2019-05-05 LAB — COMPREHENSIVE METABOLIC PANEL
ALT: 14 U/L (ref 0–44)
AST: 21 U/L (ref 15–41)
Albumin: 3.4 g/dL — ABNORMAL LOW (ref 3.5–5.0)
Alkaline Phosphatase: 70 U/L (ref 38–126)
Anion gap: 10 (ref 5–15)
BUN: 20 mg/dL (ref 8–23)
CO2: 23 mmol/L (ref 22–32)
Calcium: 9.2 mg/dL (ref 8.9–10.3)
Chloride: 111 mmol/L (ref 98–111)
Creatinine, Ser: 1.08 mg/dL (ref 0.61–1.24)
GFR calc Af Amer: >60 mL/min (ref >60)
GFR calc non Af Amer: >60 mL/min (ref >60)
Glucose, Bld: 103 mg/dL — ABNORMAL HIGH (ref 70–99)
Potassium: 3.5 mmol/L (ref 3.5–5.1)
Sodium: 144 mmol/L (ref 135–145)
Total Bilirubin: 0.5 mg/dL (ref 0.3–1.2)
Total Protein: 6.8 g/dL (ref 6.5–8.1)

## 2019-05-05 LAB — CBC WITH DIFFERENTIAL/PLATELET
Abs Immature Granulocytes: 0.01 10*3/uL (ref 0.00–0.07)
Basophils Absolute: 0.1 10*3/uL (ref 0.0–0.1)
Basophils Relative: 1 %
Eosinophils Absolute: 0.3 10*3/uL (ref 0.0–0.5)
Eosinophils Relative: 5 %
HCT: 41.9 % (ref 39.0–52.0)
Hemoglobin: 13.6 g/dL (ref 13.0–17.0)
Immature Granulocytes: 0 %
Lymphocytes Relative: 18 %
Lymphs Abs: 1 10*3/uL (ref 0.7–4.0)
MCH: 29 pg (ref 26.0–34.0)
MCHC: 32.5 g/dL (ref 30.0–36.0)
MCV: 89.3 fL (ref 80.0–100.0)
Monocytes Absolute: 0.9 10*3/uL (ref 0.1–1.0)
Monocytes Relative: 16 %
Neutro Abs: 3.6 10*3/uL (ref 1.7–7.7)
Neutrophils Relative %: 60 %
Platelets: 183 10*3/uL (ref 150–400)
RBC: 4.69 MIL/uL (ref 4.22–5.81)
RDW: 18.6 % — ABNORMAL HIGH (ref 11.5–15.5)
WBC: 5.9 10*3/uL (ref 4.0–10.5)
nRBC: 0 % (ref 0.0–0.2)

## 2019-05-05 LAB — TOTAL PROTEIN, URINE DIPSTICK: Protein, ur: NEGATIVE mg/dL

## 2019-05-05 MED ORDER — PALONOSETRON HCL INJECTION 0.25 MG/5ML
INTRAVENOUS | Status: AC
  Filled 2019-05-05: qty 5

## 2019-05-05 MED ORDER — PALONOSETRON HCL INJECTION 0.25 MG/5ML
0.2500 mg | Freq: Once | INTRAVENOUS | Status: AC
  Administered 2019-05-05: 11:00:00 0.25 mg via INTRAVENOUS

## 2019-05-05 MED ORDER — OXALIPLATIN CHEMO INJECTION 100 MG/20ML
60.0000 mg/m2 | Freq: Once | INTRAVENOUS | Status: AC
  Administered 2019-05-05: 12:00:00 110 mg via INTRAVENOUS
  Filled 2019-05-05: qty 20

## 2019-05-05 MED ORDER — DIPHENOXYLATE-ATROPINE 2.5-0.025 MG PO TABS: 1.0000 | ORAL_TABLET | Freq: Four times a day (QID) | ORAL | 1 refills | Status: AC | PRN

## 2019-05-05 MED ORDER — DEXTROSE 5 % IV SOLN
Freq: Once | INTRAVENOUS | Status: AC
  Administered 2019-05-05: 12:00:00 via INTRAVENOUS
  Filled 2019-05-05: qty 250

## 2019-05-05 MED ORDER — SODIUM CHLORIDE 0.9 % IV SOLN
INTRAVENOUS | Status: DC
  Administered 2019-05-05: 11:00:00 via INTRAVENOUS
  Filled 2019-05-05: qty 250

## 2019-05-05 MED ORDER — LEUCOVORIN CALCIUM INJECTION 350 MG
400.0000 mg/m2 | Freq: Once | INTRAVENOUS | Status: AC
  Administered 2019-05-05: 748 mg via INTRAVENOUS
  Filled 2019-05-05: qty 37.4

## 2019-05-05 MED ORDER — SODIUM CHLORIDE 0.9 % IV SOLN
2400.0000 mg/m2 | INTRAVENOUS | Status: DC
  Administered 2019-05-05: 14:00:00 4500 mg via INTRAVENOUS
  Filled 2019-05-05: qty 90

## 2019-05-05 MED ORDER — DEXAMETHASONE SODIUM PHOSPHATE 10 MG/ML IJ SOLN
INTRAMUSCULAR | Status: AC
  Filled 2019-05-05: qty 1

## 2019-05-05 MED ORDER — DEXAMETHASONE SODIUM PHOSPHATE 10 MG/ML IJ SOLN
10.0000 mg | Freq: Once | INTRAMUSCULAR | Status: AC
  Administered 2019-05-05: 11:00:00 10 mg via INTRAVENOUS

## 2019-05-05 MED ORDER — SODIUM CHLORIDE 0.9 % IV SOLN
5.0000 mg/kg | Freq: Once | INTRAVENOUS | Status: AC
  Administered 2019-05-05: 12:00:00 350 mg via INTRAVENOUS
  Filled 2019-05-05: qty 14

## 2019-05-05 NOTE — Patient Instructions (Signed)
[facility] Discharge Instructions for Patients Receiving Chemotherapy  Today you received the following chemotherapy agents: Avastin, Oxaliplatin, lucovorin, 5FU and pump  To help prevent nausea and vomiting after your treatment, we encourage you to take your nausea medication as directed.    If you develop nausea and vomiting that is not controlled by your nausea medication, call the clinic.   BELOW ARE SYMPTOMS THAT SHOULD BE REPORTED IMMEDIATELY:  *FEVER GREATER THAN 100.5 F  *CHILLS WITH OR WITHOUT FEVER  NAUSEA AND VOMITING THAT IS NOT CONTROLLED WITH YOUR NAUSEA MEDICATION  *UNUSUAL SHORTNESS OF BREATH  *UNUSUAL BRUISING OR BLEEDING  TENDERNESS IN MOUTH AND THROAT WITH OR WITHOUT PRESENCE OF ULCERS  *URINARY PROBLEMS  *BOWEL PROBLEMS  UNUSUAL RASH Items with * indicate a potential emergency and should be followed up as soon as possible.  Feel free to call the clinic should you have any questions or concerns. The clinic phone number is [phone].  Please show the [facility] at check-in to the Emergency Department and triage nurse.

## 2019-05-06 ENCOUNTER — Telehealth: Payer: Self-pay | Admitting: Nurse Practitioner

## 2019-05-06 NOTE — Telephone Encounter (Signed)
Scheduled appt per 5/28 los. °

## 2019-05-07 ENCOUNTER — Other Ambulatory Visit: Payer: Self-pay

## 2019-05-07 ENCOUNTER — Inpatient Hospital Stay: Payer: Medicare Other

## 2019-05-07 VITALS — BP 151/90 | HR 58 | Temp 99.1°F | Resp 16

## 2019-05-07 DIAGNOSIS — C20 Malignant neoplasm of rectum: Secondary | ICD-10-CM

## 2019-05-07 DIAGNOSIS — Z5111 Encounter for antineoplastic chemotherapy: Secondary | ICD-10-CM | POA: Diagnosis not present

## 2019-05-07 MED ORDER — SODIUM CHLORIDE 0.9% FLUSH
3.0000 mL | INTRAVENOUS | Status: DC | PRN
  Administered 2019-05-07: 3 mL
  Filled 2019-05-07: qty 10

## 2019-05-07 MED ORDER — HEPARIN SOD (PORK) LOCK FLUSH 100 UNIT/ML IV SOLN
500.0000 [IU] | Freq: Once | INTRAVENOUS | Status: AC | PRN
  Administered 2019-05-07: 500 [IU]
  Filled 2019-05-07: qty 5

## 2019-05-19 ENCOUNTER — Inpatient Hospital Stay: Payer: Medicare Other | Attending: Hematology | Admitting: Hematology

## 2019-05-19 ENCOUNTER — Inpatient Hospital Stay: Payer: Medicare Other

## 2019-05-19 DIAGNOSIS — G629 Polyneuropathy, unspecified: Secondary | ICD-10-CM | POA: Insufficient documentation

## 2019-05-19 DIAGNOSIS — C7802 Secondary malignant neoplasm of left lung: Secondary | ICD-10-CM | POA: Insufficient documentation

## 2019-05-19 DIAGNOSIS — D509 Iron deficiency anemia, unspecified: Secondary | ICD-10-CM | POA: Insufficient documentation

## 2019-05-19 DIAGNOSIS — C20 Malignant neoplasm of rectum: Secondary | ICD-10-CM | POA: Insufficient documentation

## 2019-05-19 DIAGNOSIS — R197 Diarrhea, unspecified: Secondary | ICD-10-CM | POA: Insufficient documentation

## 2019-05-19 DIAGNOSIS — F0390 Unspecified dementia without behavioral disturbance: Secondary | ICD-10-CM | POA: Insufficient documentation

## 2019-05-19 DIAGNOSIS — Z79899 Other long term (current) drug therapy: Secondary | ICD-10-CM | POA: Insufficient documentation

## 2019-05-19 DIAGNOSIS — Z9221 Personal history of antineoplastic chemotherapy: Secondary | ICD-10-CM | POA: Insufficient documentation

## 2019-05-19 DIAGNOSIS — Z5112 Encounter for antineoplastic immunotherapy: Secondary | ICD-10-CM | POA: Insufficient documentation

## 2019-05-19 DIAGNOSIS — C787 Secondary malignant neoplasm of liver and intrahepatic bile duct: Secondary | ICD-10-CM | POA: Insufficient documentation

## 2019-05-19 DIAGNOSIS — Z5111 Encounter for antineoplastic chemotherapy: Secondary | ICD-10-CM | POA: Insufficient documentation

## 2019-05-19 DIAGNOSIS — Z923 Personal history of irradiation: Secondary | ICD-10-CM | POA: Insufficient documentation

## 2019-05-27 NOTE — Progress Notes (Signed)
Adrian Banks   [phone] [phone]   Clinic Follow up Note   Patient Care Team: Wenda Low, MD as PCP - General (Internal Medicine)  Date of Service:  06/02/2019  CHIEF COMPLAINT: F/u on metastatic rectal cancer  SUMMARY OF ONCOLOGIC HISTORY: Oncology History Overview Note  Cancer Staging Rectal cancer [facility]) Staging form: Colon and Rectum, AJCC 8th Edition - Clinical stage from 09/29/2017: Stage IVA (cTX, cNX, cM1a) - Signed by Truitt Merle, MD on 03/21/2018    Rectal cancer ([place])  09/29/2017 Procedure   Colonoscopy by Dr. Penelope Coop 09/29/17  Findings:  The perianal and digital rectal examination were normal.  A non-obstructing large mass was found in the proximal rectum and in the mid rectum.  The mass was circumferential.  This was biopsied with a cold forceps for histology.  Area was tattooed with an injection of [place] ink both distal and proximal to the mass.  There is about 4-5 cm of rectal mucosa distal to the mass that looks norma. This mass is about 7-8 cm in length.  A 10 mm polyp was found in the ascending colon. The polyp was sessile. the polyp was removed with a hot snare.  Resection and retrieval were complete.     09/29/2017 Initial Biopsy   FINAL DIAGNOSIS 09/29/17  1. LG Intestine - Ascending Colon Polyp:  TUBULAR ADENOMA 2. LG Intestine - Rectum, Bx: INVASIVE MODERATELY DIFFERENTIATED ADENOCARCINOMA. THE MSI TESTING IS PENDING AND THE RESULT WILL BE REPORTED AS AN ADDENDUM.   09/29/2017 Cancer Staging   Staging form: Colon and Rectum, AJCC 8th Edition - Clinical stage from 09/29/2017: Stage IVA (cTX, cNX, cM1a) - Signed by Truitt Merle, MD on 03/21/2018   09/29/2017 Miscellaneous   Initial Biopsy MMR Results: MSI Stable     10/21/2017 Initial Diagnosis   Rectal cancer ([place])   10/28/2017 Imaging   CT CAP W Contrast  IMPRESSION: 1. Long segment circumferential wall thickening of the rectosigmoid colon consistent with  known adenocarcinoma. No evidence of bowel obstruction or perforation. 2. No definite signs of metastatic disease in the abdomen or pelvis. Soft tissue nodularity in the right pelvis is probably due to an unopacified pelvic vein. 3. Multiple pulmonary nodules, some cavitary. These are not typical of metastatic adenocarcinoma (especially without evidence of abdominal disease). Consider multicentric or metastatic lung cancer. PET-CT may be helpful to assess hypermetabolic activity and guided tissue sampling. 4. Multiple bladder calculi consistent with chronic bladder outlet obstruction from prostatomegaly. Nonobstructing right renal calculus. No hydronephrosis. 5. Aortic Atherosclerosis (ICD10-I70.0). Additional incidental findings including a moderate size hiatal hernia and renal cysts.    11/05/2017 - 12/18/2017 Radiation Therapy   Radiation with Dr. Lisbeth Renshaw With Xeloda   11/09/2017 - 12/18/2017 Chemotherapy   Chemoradiation with Xeloda 858m/m2 q12h, M-F on days of radiation plan to starting 11/09/17   11/11/2017 Imaging   PET Scan  IMPRESSION:  Wall thickening/ mass involving the upper rectum, corresponding to the patient's known rectal adenocarcinoma. Bilateral pulmonary nodules, partially cavitary in the left upper lobe nodule, suspicious for metastases. Notably, cavitary metastases are more common with squamous cell carcinoma rather than the patient's known adenocarcinoma.    12/25/2018 Procedure   12/25/2018 Sigmoidoscopy Impression: - Malignant tumor in the proximal rectum, in the mid rectum and in the recto-sigmoid colon. Biopsied.     12/25/2018 Pathology Results   Diagnosis 12/25/18 Rectum, biopsy - ADENOCARCINOMA. Microscopic Comment   12/25/2018 Imaging   12/25/2018 CT AP IMPRESSION: 1. Wall thickening and  enhancement of the rectum and distal sigmoid colon, consistent with known rectal carcinoma. 2. New hypoattenuating lesion in the right hepatic lobe, concerning for  metastatic disease. 3. Increased size of posterior right lung base mass, now measuring 5.4 x 3.8 cm, previously 1.8 x 2.0 cm. Multiple right lower lobe pulmonary nodules, worsened since 10/28/2017, and consistent with metastatic disease. 4. Multiple large calculi within the urinary bladder.   01/12/2019 Pathology Results   Diagnosis Lung, biopsy - METASTATIC ADENOCARCINOMA WITH NECROSIS, CONSISTENT WITH PATIENT'S CLINICAL HISTORY OF PRIMARY COLORECTAL CARCINOMA.   01/12/2019 Miscellaneous   Foundation One 01/12/19  MSI-stable  KRAS G12V MRAS Wildtype (Codones 12, 13, 59, 61, 117, and 146 in exons 2, 3 and 4) Tumor Mutational Burden - 5 Muts/Mb Alk R1192Q APC Z767HA*1 APC Splice Site [id number]>I ASXL1 R1415* MEN1 MEN1([id number])  SF3B1 K666N TP53 R273C   01/20/2019 Imaging   CT Chest Wo Contrast  IMPRESSION: Multiple bilateral pulmonary metastases, including index lesions as above, progressive from 2018.  Aortic Atherosclerosis (ICD10-I70.0) and Emphysema (ICD10-J43.9).    01/20/2019 -  Chemotherapy   First line FOLFOX every 2 weeks       CURRENT THERAPY:  First lineFOLFOX every 2 weeks, starting 01/20/2019. Added Avastin with cycle 4  INTERVAL HISTORY:  Adrian Banks is here for a follow up and treatment. He presents to the clinic alone. I called his wife to be included in the visit today. He notes he is doing well. He notes he tolerated his last chemo well. He feels best when off chemo. He notes his balance has improved. He note he fell 2-3 weeks ago. He did hit his head but this healed well. His wife notes he did trip 4 days ago outside. He notes he has numbness of his fingers which effect his grip. He does drop things. He feels weakness of his right leg. He notes BM doing better with imodium. He still has BM 7 times a day. He denies blood in stool.     REVIEW OF SYSTEMS:   Constitutional: Denies fevers, chills or abnormal weight loss Eyes: Denies blurriness of vision  Ears, nose, mouth, throat, and face: Denies mucositis or sore throat Respiratory: Denies cough, dyspnea or wheezes Cardiovascular: Denies palpitation, chest discomfort or lower extremity swelling Gastrointestinal:  Denies nausea, heartburn (+) Diarrhea improving  Skin: Denies abnormal skin rashes Lymphatics: Denies new lymphadenopathy or easy bruising Neurological: (+) Numbness of fingers, weakness of right leg (+) imbalance, recent fall  Behavioral/Psych: Mood is stable, no new changes  All other systems were reviewed with the patient and are negative.  MEDICAL HISTORY:  Past Medical History:  Diagnosis Date  . Cancer ([place]) 09/29/2017   Rectal cvancer  . High cholesterol     SURGICAL HISTORY: Past Surgical History:  Procedure Laterality Date  . BIOPSY  12/25/2018   Procedure: BIOPSY;  Surgeon: Wonda Horner, MD;  Location[facility] ENDOSCOPY;  Service: Endoscopy;;  . FLEXIBLE SIGMOIDOSCOPY N/A 12/25/2018   Procedure: Beryle Quant;  Surgeon: Wonda Horner, MD;  Location: [facility] ENDOSCOPY;  Service: Endoscopy;  Laterality: N/A;  . IR IMAGING GUIDED PORT INSERTION  01/11/2019    I have reviewed the social history and family history with the patient and they are unchanged from previous note.  ALLERGIES:  has No Known Allergies.  MEDICATIONS:  Current Outpatient Medications  Medication Sig Dispense Refill  . diphenoxylate-atropine (LOMOTIL) 2.5-0.025 MG tablet Take 1-2 tablets by mouth 4 (four) times daily as needed for diarrhea or loose stools.  60 tablet 1  . finasteride (PROSCAR) 5 MG tablet Take 5 mg by mouth daily.     Marland Kitchen lidocaine-prilocaine (EMLA) cream Apply to affected area once 30 g 3  . lovastatin (MEVACOR) 40 MG tablet Take 40 mg by mouth daily.     . naproxen sodium (ALEVE) 220 MG tablet Take 220 mg by mouth daily as needed (pain).    . ondansetron (ZOFRAN) 8 MG tablet Take 1 tablet (8 mg total) by mouth 2 (two) times daily as needed for refractory nausea / vomiting.  Start on day 3 after chemotherapy. 30 tablet 1  . prochlorperazine (COMPAZINE) 10 MG tablet Take 1 tablet (10 mg total) by mouth every 6 (six) hours as needed (Nausea or vomiting). 30 tablet 1   No current facility-administered medications for this visit.    Facility-Administered Medications Ordered in Other Visits  Medication Dose Route Frequency Provider Last Rate Last Dose  . fluorouracil (ADRUCIL) 3,750 mg in sodium chloride 0.9 % 75 mL chemo infusion  2,000 mg/m2 (Treatment Plan Recorded) Intravenous 1 day or 1 dose Truitt Merle, MD      . heparin lock flush 100 unit/mL  500 Units Intravenous Once Truitt Merle, MD      . leucovorin 748 mg in dextrose 5 % 250 mL infusion  400 mg/m2 (Treatment Plan Recorded) Intravenous Once Truitt Merle, MD 144 mL/hr at 06/02/19 1304 748 mg at 06/02/19 1304  . oxaliplatin (ELOXATIN) 110 mg in dextrose 5 % 500 mL chemo infusion  60 mg/m2 (Treatment Plan Recorded) Intravenous Once Truitt Merle, MD 261 mL/hr at 06/02/19 1305 110 mg at 06/02/19 1305  . sodium chloride flush (NS) 0.9 % injection 10 mL  10 mL Intracatheter PRN Truitt Merle, MD        PHYSICAL EXAMINATION: ECOG PERFORMANCE STATUS: 1 - Symptomatic but completely ambulatory  Vitals:   06/02/19 1044 06/02/19 1046  BP: 128/77 128/77  Pulse: 88 88  Resp: 20 20  Temp: (!) 97.5 F (36.4 C) (!) 97.5 F (36.4 C)  SpO2: 96% 96%   Filed Weights   06/02/19 1044 06/02/19 1046  Weight: 150 lb 6.4 oz (68.2 kg) 150 lb 6.4 oz (68.2 kg)    GENERAL:alert, no distress and comfortable SKIN: skin color, texture, turgor are normal, no rashes or significant lesions EYES: normal, Conjunctiva are pink and non-injected, sclera clear  NECK: supple, thyroid normal size, non-tender, without nodularity LYMPH:  no palpable lymphadenopathy in the cervical, axillary  LUNGS: clear to auscultation and percussion with normal breathing effort HEART: regular rate & rhythm and no murmurs and no lower extremity edema ABDOMEN:abdomen  soft, non-tender and normal bowel sounds Musculoskeletal:no cyanosis of digits and no clubbing  NEURO: alert & oriented x 3 with fluent speech, (+) mild focal sensory deficit in hand (R more than L)  LABORATORY DATA:  I have reviewed the data as listed CBC Latest Ref Rng & Units 06/02/2019 05/05/2019 04/21/2019  WBC 4.0 - 10.5 K/uL 5.8 5.9 6.7  Hemoglobin 13.0 - 17.0 g/dL 13.7 13.6 13.4  Hematocrit 39.0 - 52.0 % 41.8 41.9 41.6  Platelets 150 - 400 K/uL 132(L) 183 159     CMP Latest Ref Rng & Units 06/02/2019 05/05/2019 04/21/2019  Glucose 70 - 99 mg/dL 93 103(H) 97  BUN 8 - 23 mg/dL _0 Creatinine 0.61 - 1.24 mg/dL 1.19 1.08 0.96  Sodium 135 - 145 mmol/L 143 144 141  Potassium 3.5 - 5.1 mmol/L 3.7 3.5 3.6  Chloride 98 -  111 mmol/L 108 111 108  CO2 22 - 32 mmol/L _0 Calcium 8.9 - 10.3 mg/dL 9.2 9.2 9.2  Total Protein 6.5 - 8.1 g/dL 6.9 6.8 6.9  Total Bilirubin 0.3 - 1.2 mg/dL 0.6 0.5 0.5  Alkaline Phos 38 - 126 U/L 74 70 74  AST 15 - 41 U/L _1 ALT 0 - 44 U/L _2 RADIOGRAPHIC STUDIES: I have personally reviewed the radiological images as listed and agreed with the findings in the report. No results found.   ASSESSMENT & PLAN:  Adrian Banks is a 85 y.o. male with    1. Rectal Cancer, invasive adenocarcinoma,with lung and liver metastasis, cTxNxM1, stage IV, MSS, KRAS mutation(+) -Diagnosed in 09/2017.His initial staging scans showedbilateral lung nodules, highly suspicious for lung metastasis.He declined biopsy of his lung lesions. He was treatedwith radiation andoralchemoXelodato his primary rectal cancer.I last saw him in 03/2018 and he lost f/u since then.His 12/25/18 rectal biopsy and2/5/20 Lung biopsy has confirmedrectal caner recurrence with lungmetastasis. -He started FOLFOX every 2 weeks on 01/20/19.Due to his advanced age, I reduced chemo dose. He tolerated well.I added biological agent Avastin starting with cycle 4. -His FO  results shows MSI-stable.KRAS mutation.He is not eligible for immunotherapy or EGFR inhibitor. -I previously discussed he will continue chemo for as long as he can tolerate and it controls his disease. -Due to poor tolerance to chemo lately, will continue dose reduced FOLFOX and Avastin for additional 2 more cycle, and change to every 3 weeks, then switch to Xeloda and Avastin maintenance therapy.  -His balance and neuropathy has worsened lately, diarrhea is mildly improving. Will reduce chemo dose further and reduce to every 3 weeks. After the next 3 treatments plan to do CT scan before proceeding with oral maintenance therapy. He and his wife agree.  -Labs reviewed, CBC and CMP WNL except PLT 132K. Iron panel and CEA still pending. Overall adequate to proceed with FOLFOX/Avastin today at further reduced dose due to his tolerance issue.   -F/u in 3 weeks   2. Anemia,secondary to rectal bleeding andiron deficiency -new sinceearly 2019  -probably related to his rectal cancer bleeding -will check iron studyperiodically. -S/p2doses of IV iron recently,he responded well.  -Anemia resolved lately. iron panel still pending today (06/02/19)  3. Diarrhea, Rectal Bleeding  -Secondary to rectal cancer recurrence  -Hematochezia has resolved -Diarrhea mildly improving with Imodium. He has bowel movements about 7 times a day, no loose stool.  -Continuelomotil to 2 in the morning or in the evening and use it as needed in between, up to 8 tabs a day.  4.Goal of care discussion  -We again discussed the incurable nature of his cancer, and the overall poor prognosis, especially if he does not have good response to chemotherapy or progresses.  -The patient understands the goal of care is palliative. -I recommend DNR/DNI, he is agreeable with DNR/DNI. I advised him to discuss this with his wife and daughter.   5. Dementia  -Pt has appeared to have some degree of cognitive dysfunction since a  saw him the first time, this is much worse now, I think he needs further evaluation  -I previously discussed with his wife. She states pt does not talk much at home, he functions well, she have not noticed any change of his memory or behavioral issues.  -Will also call his daughter and inform her, and I will encourage his family to bring him  to see his PCP for dementia evaluation   6. Neuropathy, Imbalance  -He has been dropping things due to numbness in fingers and has right leg weakness -Has mild sensation decrease of hands on exam today (06/02/19) -Will reduce chemo dose and plans to switch to maintenance therapy soon.    Plan -Labs reviewed and adequate to proceed with FOLFOX and Avastin at reduced dose today and change to every 3 weeks for 2 more cycles  -Lab, flush, f/u and FOLFOX/Avastin in 3 weeks    No problem-specific Assessment & Plan notes found for this encounter.   No orders of the defined types were placed in this encounter.  All questions were answered. The patient knows to call the clinic with any problems, questions or concerns. No barriers to learning was detected. I spent 20 minutes counseling the patient face to face. The total time spent in the appointment was 25 minutes and more than 50% was on counseling and review of test results     Truitt Merle, MD 06/02/2019   I, Joslyn Devon, am acting as scribe for Truitt Merle, MD.   I have reviewed the above documentation for accuracy and completeness, and I agree with the above.

## 2019-05-31 ENCOUNTER — Other Ambulatory Visit: Payer: Self-pay | Admitting: Hematology

## 2019-06-02 ENCOUNTER — Telehealth: Payer: Self-pay | Admitting: Hematology

## 2019-06-02 ENCOUNTER — Inpatient Hospital Stay: Payer: Medicare Other

## 2019-06-02 ENCOUNTER — Inpatient Hospital Stay (HOSPITAL_BASED_OUTPATIENT_CLINIC_OR_DEPARTMENT_OTHER): Payer: Medicare Other | Admitting: Hematology

## 2019-06-02 ENCOUNTER — Encounter: Payer: Self-pay | Admitting: Hematology

## 2019-06-02 ENCOUNTER — Other Ambulatory Visit: Payer: Self-pay

## 2019-06-02 VITALS — BP 128/77 | HR 88 | Temp 97.5°F | Resp 20 | Ht 70.0 in | Wt 150.4 lb

## 2019-06-02 VITALS — BP 151/77

## 2019-06-02 DIAGNOSIS — R197 Diarrhea, unspecified: Secondary | ICD-10-CM | POA: Diagnosis not present

## 2019-06-02 DIAGNOSIS — D509 Iron deficiency anemia, unspecified: Secondary | ICD-10-CM | POA: Diagnosis not present

## 2019-06-02 DIAGNOSIS — G629 Polyneuropathy, unspecified: Secondary | ICD-10-CM | POA: Diagnosis not present

## 2019-06-02 DIAGNOSIS — C7802 Secondary malignant neoplasm of left lung: Secondary | ICD-10-CM

## 2019-06-02 DIAGNOSIS — D500 Iron deficiency anemia secondary to blood loss (chronic): Secondary | ICD-10-CM

## 2019-06-02 DIAGNOSIS — C20 Malignant neoplasm of rectum: Secondary | ICD-10-CM

## 2019-06-02 DIAGNOSIS — Z923 Personal history of irradiation: Secondary | ICD-10-CM

## 2019-06-02 DIAGNOSIS — F0390 Unspecified dementia without behavioral disturbance: Secondary | ICD-10-CM

## 2019-06-02 DIAGNOSIS — Z5112 Encounter for antineoplastic immunotherapy: Secondary | ICD-10-CM | POA: Diagnosis not present

## 2019-06-02 DIAGNOSIS — Z79899 Other long term (current) drug therapy: Secondary | ICD-10-CM | POA: Diagnosis not present

## 2019-06-02 DIAGNOSIS — Z9221 Personal history of antineoplastic chemotherapy: Secondary | ICD-10-CM | POA: Diagnosis not present

## 2019-06-02 DIAGNOSIS — C787 Secondary malignant neoplasm of liver and intrahepatic bile duct: Secondary | ICD-10-CM | POA: Diagnosis not present

## 2019-06-02 DIAGNOSIS — Z95828 Presence of other vascular implants and grafts: Secondary | ICD-10-CM

## 2019-06-02 DIAGNOSIS — Z5111 Encounter for antineoplastic chemotherapy: Secondary | ICD-10-CM | POA: Diagnosis present

## 2019-06-02 LAB — CMP (CANCER CENTER ONLY)
ALT: 12 U/L (ref 0–44)
AST: 19 U/L (ref 15–41)
Albumin: 3.5 g/dL (ref 3.5–5.0)
Alkaline Phosphatase: 74 U/L (ref 38–126)
Anion gap: 9 (ref 5–15)
BUN: 22 mg/dL (ref 8–23)
CO2: 26 mmol/L (ref 22–32)
Calcium: 9.2 mg/dL (ref 8.9–10.3)
Chloride: 108 mmol/L (ref 98–111)
Creatinine: 1.19 mg/dL (ref 0.61–1.24)
GFR, Est AFR Am: >60 mL/min (ref >60)
GFR, Estimated: 56 mL/min — ABNORMAL LOW (ref >60)
Glucose, Bld: 93 mg/dL (ref 70–99)
Potassium: 3.7 mmol/L (ref 3.5–5.1)
Sodium: 143 mmol/L (ref 135–145)
Total Bilirubin: 0.6 mg/dL (ref 0.3–1.2)
Total Protein: 6.9 g/dL (ref 6.5–8.1)

## 2019-06-02 LAB — CBC WITH DIFFERENTIAL/PLATELET
Abs Immature Granulocytes: 0.01 10*3/uL (ref 0.00–0.07)
Basophils Absolute: 0.1 10*3/uL (ref 0.0–0.1)
Basophils Relative: 1 %
Eosinophils Absolute: 0.2 10*3/uL (ref 0.0–0.5)
Eosinophils Relative: 4 %
HCT: 41.8 % (ref 39.0–52.0)
Hemoglobin: 13.7 g/dL (ref 13.0–17.0)
Immature Granulocytes: 0 %
Lymphocytes Relative: 13 %
Lymphs Abs: 0.8 10*3/uL (ref 0.7–4.0)
MCH: 30.2 pg (ref 26.0–34.0)
MCHC: 32.8 g/dL (ref 30.0–36.0)
MCV: 92.1 fL (ref 80.0–100.0)
Monocytes Absolute: 0.7 10*3/uL (ref 0.1–1.0)
Monocytes Relative: 13 %
Neutro Abs: 4 10*3/uL (ref 1.7–7.7)
Neutrophils Relative %: 69 %
Platelets: 132 10*3/uL — ABNORMAL LOW (ref 150–400)
RBC: 4.54 MIL/uL (ref 4.22–5.81)
RDW: 17.2 % — ABNORMAL HIGH (ref 11.5–15.5)
WBC: 5.8 10*3/uL (ref 4.0–10.5)
nRBC: 0 % (ref 0.0–0.2)

## 2019-06-02 LAB — IRON AND TIBC
Iron: 97 ug/dL (ref 42–163)
Saturation Ratios: 27 % (ref 20–55)
TIBC: 363 ug/dL (ref 202–409)
UIBC: 265 ug/dL (ref 117–376)

## 2019-06-02 LAB — FERRITIN: Ferritin: 35 ng/mL (ref 24–336)

## 2019-06-02 LAB — CEA (IN HOUSE-CHCC): CEA (CHCC-In House): 21.45 ng/mL — ABNORMAL HIGH (ref 0.00–5.00)

## 2019-06-02 MED ORDER — SODIUM CHLORIDE 0.9% FLUSH
10.0000 mL | Freq: Once | INTRAVENOUS | Status: AC
  Administered 2019-06-02: 11:00:00 10 mL
  Filled 2019-06-02: qty 10

## 2019-06-02 MED ORDER — SODIUM CHLORIDE 0.9 % IV SOLN
5.0000 mg/kg | Freq: Once | INTRAVENOUS | Status: AC
  Administered 2019-06-02: 350 mg via INTRAVENOUS
  Filled 2019-06-02: qty 14

## 2019-06-02 MED ORDER — DEXAMETHASONE SODIUM PHOSPHATE 10 MG/ML IJ SOLN
10.0000 mg | Freq: Once | INTRAMUSCULAR | Status: AC
  Administered 2019-06-02: 12:00:00 10 mg via INTRAVENOUS

## 2019-06-02 MED ORDER — PALONOSETRON HCL INJECTION 0.25 MG/5ML
0.2500 mg | Freq: Once | INTRAVENOUS | Status: AC
  Administered 2019-06-02: 12:00:00 0.25 mg via INTRAVENOUS

## 2019-06-02 MED ORDER — SODIUM CHLORIDE 0.9 % IV SOLN
Freq: Once | INTRAVENOUS | Status: AC
  Administered 2019-06-02: 12:00:00 via INTRAVENOUS
  Filled 2019-06-02: qty 250

## 2019-06-02 MED ORDER — LEUCOVORIN CALCIUM INJECTION 350 MG
400.0000 mg/m2 | Freq: Once | INTRAVENOUS | Status: AC
  Administered 2019-06-02: 748 mg via INTRAVENOUS
  Filled 2019-06-02: qty 37.4

## 2019-06-02 MED ORDER — DEXTROSE 5 % IV SOLN
Freq: Once | INTRAVENOUS | Status: AC
  Administered 2019-06-02: 13:00:00 via INTRAVENOUS
  Filled 2019-06-02: qty 250

## 2019-06-02 MED ORDER — SODIUM CHLORIDE 0.9 % IV SOLN
2000.0000 mg/m2 | INTRAVENOUS | Status: DC
  Administered 2019-06-02: 15:00:00 3750 mg via INTRAVENOUS
  Filled 2019-06-02: qty 75

## 2019-06-02 MED ORDER — SODIUM CHLORIDE 0.9% FLUSH
10.0000 mL | INTRAVENOUS | Status: DC | PRN
  Administered 2019-06-02: 10 mL
  Filled 2019-06-02: qty 10

## 2019-06-02 MED ORDER — DEXAMETHASONE SODIUM PHOSPHATE 10 MG/ML IJ SOLN
INTRAMUSCULAR | Status: AC
  Filled 2019-06-02: qty 1

## 2019-06-02 MED ORDER — DEXTROSE 5 % IV SOLN
Freq: Once | INTRAVENOUS | Status: AC
  Administered 2019-06-02: 12:00:00 via INTRAVENOUS
  Filled 2019-06-02: qty 250

## 2019-06-02 MED ORDER — PALONOSETRON HCL INJECTION 0.25 MG/5ML
INTRAVENOUS | Status: AC
  Filled 2019-06-02: qty 5

## 2019-06-02 MED ORDER — OXALIPLATIN CHEMO INJECTION 100 MG/20ML
60.0000 mg/m2 | Freq: Once | INTRAVENOUS | Status: AC
  Administered 2019-06-02: 13:00:00 110 mg via INTRAVENOUS
  Filled 2019-06-02: qty 20

## 2019-06-02 NOTE — Progress Notes (Signed)
Per Dr. Burr Medico to dose reduce 5-FU pump today and no further reductions for oxaliplatin (continue oxaliplatin at previous dose reduction at 60mg /m2).    Jalene Mullet, PharmD PGY2 Hematology/ Oncology Pharmacy Resident 06/02/2019 11:52 AM

## 2019-06-02 NOTE — Telephone Encounter (Signed)
Scheduled appt per 6/25 los.  Added treatment to the book for approval for 7/16. Will contact patient once treatment has been added.

## 2019-06-02 NOTE — Patient Instructions (Signed)
[facility] Discharge Instructions for Patients Receiving Chemotherapy  Today you received the following chemotherapy agents: Avastin, Oxaliplatin, lucovorin, 5FU and pump  To help prevent nausea and vomiting after your treatment, we encourage you to take your nausea medication as directed.    If you develop nausea and vomiting that is not controlled by your nausea medication, call the clinic.   BELOW ARE SYMPTOMS THAT SHOULD BE REPORTED IMMEDIATELY:  *FEVER GREATER THAN 100.5 F  *CHILLS WITH OR WITHOUT FEVER  NAUSEA AND VOMITING THAT IS NOT CONTROLLED WITH YOUR NAUSEA MEDICATION  *UNUSUAL SHORTNESS OF BREATH  *UNUSUAL BRUISING OR BLEEDING  TENDERNESS IN MOUTH AND THROAT WITH OR WITHOUT PRESENCE OF ULCERS  *URINARY PROBLEMS  *BOWEL PROBLEMS  UNUSUAL RASH Items with * indicate a potential emergency and should be followed up as soon as possible.  Feel free to call the clinic should you have any questions or concerns. The clinic phone number is [phone].  Please show the [facility] at check-in to the Emergency Department and triage nurse.

## 2019-06-04 ENCOUNTER — Other Ambulatory Visit: Payer: Self-pay

## 2019-06-04 ENCOUNTER — Inpatient Hospital Stay: Payer: Medicare Other

## 2019-06-04 VITALS — BP 166/74 | HR 79 | Temp 98.7°F | Resp 18

## 2019-06-04 DIAGNOSIS — Z5112 Encounter for antineoplastic immunotherapy: Secondary | ICD-10-CM | POA: Diagnosis not present

## 2019-06-04 DIAGNOSIS — Z95828 Presence of other vascular implants and grafts: Secondary | ICD-10-CM

## 2019-06-04 MED ORDER — HEPARIN SOD (PORK) LOCK FLUSH 100 UNIT/ML IV SOLN
500.0000 [IU] | Freq: Once | INTRAVENOUS | Status: AC
  Administered 2019-06-04: 500 [IU] via INTRAVENOUS
  Filled 2019-06-04: qty 5

## 2019-06-04 MED ORDER — SODIUM CHLORIDE 0.9% FLUSH
10.0000 mL | INTRAVENOUS | Status: DC | PRN
  Administered 2019-06-04: 10 mL via INTRAVENOUS
  Filled 2019-06-04: qty 10

## 2019-06-23 ENCOUNTER — Encounter: Payer: Self-pay | Admitting: Nurse Practitioner

## 2019-06-23 ENCOUNTER — Other Ambulatory Visit: Payer: Medicare Other

## 2019-06-23 ENCOUNTER — Ambulatory Visit: Payer: Medicare Other | Admitting: Hematology

## 2019-06-23 ENCOUNTER — Inpatient Hospital Stay: Payer: Medicare Other

## 2019-06-23 ENCOUNTER — Other Ambulatory Visit: Payer: Self-pay

## 2019-06-23 ENCOUNTER — Inpatient Hospital Stay: Payer: Medicare Other | Attending: Hematology | Admitting: Nurse Practitioner

## 2019-06-23 VITALS — BP 159/90 | HR 62

## 2019-06-23 DIAGNOSIS — Z79899 Other long term (current) drug therapy: Secondary | ICD-10-CM

## 2019-06-23 DIAGNOSIS — C20 Malignant neoplasm of rectum: Secondary | ICD-10-CM

## 2019-06-23 DIAGNOSIS — D500 Iron deficiency anemia secondary to blood loss (chronic): Secondary | ICD-10-CM | POA: Diagnosis not present

## 2019-06-23 DIAGNOSIS — C7802 Secondary malignant neoplasm of left lung: Secondary | ICD-10-CM | POA: Insufficient documentation

## 2019-06-23 DIAGNOSIS — C787 Secondary malignant neoplasm of liver and intrahepatic bile duct: Secondary | ICD-10-CM | POA: Insufficient documentation

## 2019-06-23 DIAGNOSIS — Z5111 Encounter for antineoplastic chemotherapy: Secondary | ICD-10-CM | POA: Insufficient documentation

## 2019-06-23 DIAGNOSIS — D490 Neoplasm of unspecified behavior of digestive system: Secondary | ICD-10-CM

## 2019-06-23 LAB — CBC WITH DIFFERENTIAL/PLATELET
Abs Immature Granulocytes: 0.01 10*3/uL (ref 0.00–0.07)
Basophils Absolute: 0.1 10*3/uL (ref 0.0–0.1)
Basophils Relative: 1 %
Eosinophils Absolute: 0.2 10*3/uL (ref 0.0–0.5)
Eosinophils Relative: 5 %
HCT: 40.6 % (ref 39.0–52.0)
Hemoglobin: 13.3 g/dL (ref 13.0–17.0)
Immature Granulocytes: 0 %
Lymphocytes Relative: 18 %
Lymphs Abs: 0.7 10*3/uL (ref 0.7–4.0)
MCH: 30.2 pg (ref 26.0–34.0)
MCHC: 32.8 g/dL (ref 30.0–36.0)
MCV: 92.1 fL (ref 80.0–100.0)
Monocytes Absolute: 0.7 10*3/uL (ref 0.1–1.0)
Monocytes Relative: 18 %
Neutro Abs: 2.3 10*3/uL (ref 1.7–7.7)
Neutrophils Relative %: 58 %
Platelets: 147 10*3/uL — ABNORMAL LOW (ref 150–400)
RBC: 4.41 MIL/uL (ref 4.22–5.81)
RDW: 16.2 % — ABNORMAL HIGH (ref 11.5–15.5)
WBC: 3.9 10*3/uL — ABNORMAL LOW (ref 4.0–10.5)
nRBC: 0 % (ref 0.0–0.2)

## 2019-06-23 LAB — CMP (CANCER CENTER ONLY)
ALT: 14 U/L (ref 0–44)
AST: 22 U/L (ref 15–41)
Albumin: 3.5 g/dL (ref 3.5–5.0)
Alkaline Phosphatase: 76 U/L (ref 38–126)
Anion gap: 11 (ref 5–15)
BUN: 19 mg/dL (ref 8–23)
CO2: 24 mmol/L (ref 22–32)
Calcium: 9 mg/dL (ref 8.9–10.3)
Chloride: 108 mmol/L (ref 98–111)
Creatinine: 0.95 mg/dL (ref 0.61–1.24)
GFR, Est AFR Am: >60 mL/min (ref >60)
GFR, Estimated: >60 mL/min (ref >60)
Glucose, Bld: 102 mg/dL — ABNORMAL HIGH (ref 70–99)
Potassium: 3.7 mmol/L (ref 3.5–5.1)
Sodium: 143 mmol/L (ref 135–145)
Total Bilirubin: 0.7 mg/dL (ref 0.3–1.2)
Total Protein: 7 g/dL (ref 6.5–8.1)

## 2019-06-23 LAB — IRON AND TIBC
Iron: 77 ug/dL (ref 42–163)
Saturation Ratios: 21 % (ref 20–55)
TIBC: 368 ug/dL (ref 202–409)
UIBC: 291 ug/dL (ref 117–376)

## 2019-06-23 LAB — FERRITIN: Ferritin: 36 ng/mL (ref 24–336)

## 2019-06-23 LAB — CEA (IN HOUSE-CHCC): CEA (CHCC-In House): 22.25 ng/mL — ABNORMAL HIGH (ref 0.00–5.00)

## 2019-06-23 MED ORDER — DEXAMETHASONE SODIUM PHOSPHATE 10 MG/ML IJ SOLN
10.0000 mg | Freq: Once | INTRAMUSCULAR | Status: AC
  Administered 2019-06-23: 10 mg via INTRAVENOUS

## 2019-06-23 MED ORDER — LEUCOVORIN CALCIUM INJECTION 350 MG
400.0000 mg/m2 | Freq: Once | INTRAVENOUS | Status: AC
  Administered 2019-06-23: 748 mg via INTRAVENOUS
  Filled 2019-06-23: qty 37.4

## 2019-06-23 MED ORDER — PALONOSETRON HCL INJECTION 0.25 MG/5ML
0.2500 mg | Freq: Once | INTRAVENOUS | Status: AC
  Administered 2019-06-23: 0.25 mg via INTRAVENOUS

## 2019-06-23 MED ORDER — DEXTROSE 5 % IV SOLN
Freq: Once | INTRAVENOUS | Status: AC
  Administered 2019-06-23: 13:00:00 via INTRAVENOUS
  Filled 2019-06-23: qty 250

## 2019-06-23 MED ORDER — DEXAMETHASONE SODIUM PHOSPHATE 10 MG/ML IJ SOLN
INTRAMUSCULAR | Status: AC
  Filled 2019-06-23: qty 1

## 2019-06-23 MED ORDER — SODIUM CHLORIDE 0.9 % IV SOLN
2000.0000 mg/m2 | INTRAVENOUS | Status: DC
  Administered 2019-06-23: 3750 mg via INTRAVENOUS
  Filled 2019-06-23: qty 75

## 2019-06-23 MED ORDER — PALONOSETRON HCL INJECTION 0.25 MG/5ML
INTRAVENOUS | Status: AC
  Filled 2019-06-23: qty 5

## 2019-06-23 MED ORDER — DEXTROSE 5 % IV SOLN
Freq: Once | INTRAVENOUS | Status: AC
  Filled 2019-06-23: qty 250

## 2019-06-23 MED ORDER — SODIUM CHLORIDE 0.9 % IV SOLN
Freq: Once | INTRAVENOUS | Status: AC
  Administered 2019-06-23: 12:00:00 via INTRAVENOUS
  Filled 2019-06-23: qty 250

## 2019-06-23 MED ORDER — SODIUM CHLORIDE 0.9 % IV SOLN
5.0000 mg/kg | Freq: Once | INTRAVENOUS | Status: AC
  Administered 2019-06-23: 350 mg via INTRAVENOUS
  Filled 2019-06-23: qty 14

## 2019-06-23 MED ORDER — OXALIPLATIN CHEMO INJECTION 100 MG/20ML
60.0000 mg/m2 | Freq: Once | INTRAVENOUS | Status: AC
  Administered 2019-06-23: 110 mg via INTRAVENOUS
  Filled 2019-06-23: qty 22

## 2019-06-23 NOTE — Patient Instructions (Signed)
Tunneled Central Venous Catheter Flushing Guide  It is important to flush your tunneled central venous catheter each time you use it, both before and after you use it. Flushing your catheter will help prevent it from clogging. What are the risks? Risks may include:  Infection.  Air getting into the catheter and bloodstream. Supplies needed:  A clean pair of gloves.  A disinfecting wipe. Use an alcohol wipe, chlorhexidine wipe, or iodine wipe as told by your health care provider.  A 10 mL syringe that has been prefilled with saline solution.  An empty 10 mL syringe, if a substance called heparin was injected into your catheter. How to flush your catheter When you flush your catheter, make sure you follow any specific instructions from your health care provider or the manufacturer. These are general guidelines. Flushing your catheter before use If there is heparin in your catheter: 1. Wash your hands with soap and water. 2. Put on gloves. 3. Scrub the injection cap for a minimum of 15 seconds with a disinfecting wipe. 4. Unclamp the catheter. 5. Attach the empty syringe to the injection cap. 6. Pull the syringe plunger back and withdraw 10 mL of blood. 7. Place the syringe into an appropriate waste container. 8. Scrub the injection cap for 15 seconds with a disinfecting wipe. 9. Attach the prefilled syringe to the injection cap. 10. Flush the catheter by pushing the plunger forward until all the liquid from the syringe is in the catheter. 11. Remove the syringe from the injection cap. 12. Clamp the catheter. If there is no heparin in your catheter: 1. Wash your hands with soap and water. 2. Put on gloves. 3. Scrub the injection cap for 15 seconds with a disinfecting wipe. 4. Unclamp the catheter. 5. Attach the prefilled syringe to the injection cap. 6. Flush the catheter by pushing the plunger forward until 5 mL of the liquid from the syringe is in the catheter. 7. Pull back on  the syringe until you see blood in the catheter. 8. If you have been asked to collect any blood, follow your health care provider's instructions. Otherwise, flush the catheter with the rest of the solution from the syringe. 9. Remove the syringe from the injection cap. 10. Clamp the catheter.  Flushing your catheter after use 1. Wash your hands with soap and water. 2. Put on gloves. 3. Scrub the injection cap for 15 seconds with a disinfecting wipe. 4. Unclamp the catheter. 5. Attach the prefilled syringe to the injection cap. 6. Flush the catheter by pushing the plunger forward until all of the liquid from the syringe is in the catheter. 7. Remove the syringe from the injection cap. 8. Clamp the catheter. Problems and solutions  If blood cannot be completely cleared from the injection cap, you may need to have the injection cap replaced.  If the catheter is difficult to flush, use the pulsing method. The pulsing method involves pushing only a few milliliters of solution into the catheter at a time and pausing between pushes.  If you do not see blood in the catheter when you pull back on the syringe, change your body position, such as by raising your arms above your head. Take a deep breath and cough. Then, pull back on the syringe. If you still do not see blood, flush the catheter with a small amount of solution. Then, change positions again and take a breath or cough. Pull back on the syringe again. If you still do not see   blood, finish flushing the catheter and contact your health care provider. Do not use your catheter until your health care provider says it is okay. General tips  Have someone help you flush your catheter, if possible.  Do not force fluid through your catheter.  Do not use a syringe that is larger or smaller than 10 mL. Using a smaller syringe can make the catheter burst.  Do not use your catheter without flushing it first if it has heparin in it. Contact a health  care provider if:  You cannot see any blood in the catheter when you flush it before using it.  Your catheter is difficult to flush. Get help right away if:  You cannot flush the catheter.  The catheter leaks when you flush it or when there is fluid in it.  There are cracks or breaks in the catheter. Summary  It is important to flush your tunneled central venous catheter each time you use it, both before and after you use it.  Scrub the injection cap for 15 seconds with a disinfecting wipe before and after you flush it.  When you flush your catheter, make sure you follow any specific instructions from your health care provider or the manufacturer.  Get help right away if you cannot flush the catheter. This information is not intended to replace advice given to you by your health care provider. Make sure you discuss any questions you have with your health care provider. Document Released: 11/13/2011 Document Revised: 02/09/2019 Document Reviewed: 02/09/2019 Elsevier Patient Education  2020 [employer]  

## 2019-06-23 NOTE — Patient Instructions (Signed)
[facility] Discharge Instructions for Patients Receiving Chemotherapy  Today you received the following chemotherapy agents: Bevacizumab (Avastin), Oxaliplatin (Eloxatin), Leucovorin, Fluorouracil (Adrucil, 5-FU)  To help prevent nausea and vomiting after your treatment, we encourage you to take your nausea medication as directed.   If you develop nausea and vomiting that is not controlled by your nausea medication, call the clinic.   BELOW ARE SYMPTOMS THAT SHOULD BE REPORTED IMMEDIATELY:  *FEVER GREATER THAN 100.5 F  *CHILLS WITH OR WITHOUT FEVER  NAUSEA AND VOMITING THAT IS NOT CONTROLLED WITH YOUR NAUSEA MEDICATION  *UNUSUAL SHORTNESS OF BREATH  *UNUSUAL BRUISING OR BLEEDING  TENDERNESS IN MOUTH AND THROAT WITH OR WITHOUT PRESENCE OF ULCERS  *URINARY PROBLEMS  *BOWEL PROBLEMS  UNUSUAL RASH Items with * indicate a potential emergency and should be followed up as soon as possible.  Feel free to call the clinic should you have any questions or concerns. The clinic phone number is [phone].  Please show the [facility] at check-in to the Emergency Department and triage nurse.  Coronavirus (COVID-19) Are you at risk?  Are you at risk for the Coronavirus (COVID-19)?  To be considered HIGH RISK for Coronavirus (COVID-19), you have to meet the following criteria:  . Traveled to [place], [place], [place], [place] or [place]; or in the [place] to [place], [place], [place], or [place]; and have fever, cough, and shortness of breath within the last 2 weeks of travel OR . Been in close contact with a person diagnosed with COVID-19 within the last 2 weeks and have fever, cough, and shortness of breath . IF YOU DO NOT MEET THESE CRITERIA, YOU ARE CONSIDERED LOW RISK FOR COVID-19.  What to do if you are HIGH RISK for COVID-19?  Marland Kitchen If you are having a medical emergency, call 911. . Seek medical care right away. Before you go to a doctor's office,  urgent care or emergency department, call ahead and tell them about your recent travel, contact with someone diagnosed with COVID-19, and your symptoms. You should receive instructions from your physician's office regarding next steps of care.  . When you arrive at healthcare provider, tell the healthcare staff immediately you have returned from visiting [place], [place], [place], [place] or [place]; or traveled in the [place] to [place], [place], [place], or [place]; in the last two weeks or you have been in close contact with a person diagnosed with COVID-19 in the last 2 weeks.   . Tell the health care staff about your symptoms: fever, cough and shortness of breath. . After you have been seen by a medical provider, you will be either: o Tested for (COVID-19) and discharged home on quarantine except to seek medical care if symptoms worsen, and asked to  - Stay home and avoid contact with others until you get your results (4-5 days)  - Avoid travel on public transportation if possible (such as bus, train, or airplane) or o Sent to the Emergency Department by EMS for evaluation, COVID-19 testing, and possible admission depending on your condition and test results.  What to do if you are LOW RISK for COVID-19?  Reduce your risk of any infection by using the same precautions used for avoiding the common cold or flu:  Marland Kitchen Wash your hands often with soap and warm water for at least 20 seconds.  If soap and water are not readily available, use an alcohol-based hand sanitizer with at least 60%  alcohol.  . If coughing or sneezing, cover your mouth and nose by coughing or sneezing into the elbow areas of your shirt or coat, into a tissue or into your sleeve (not your hands). . Avoid shaking hands with others and consider head nods or verbal greetings only. . Avoid touching your eyes, nose, or mouth with unwashed hands.  . Avoid close contact with people who are sick. . Avoid places or events with  large numbers of people in one location, like concerts or sporting events. . Carefully consider travel plans you have or are making. . If you are planning any travel outside or inside the [place], visit the CDC's Travelers' Health webpage for the latest health notices. . If you have some symptoms but not all symptoms, continue to monitor at home and seek medical attention if your symptoms worsen. . If you are having a medical emergency, call 911.   ADDITIONAL HEALTHCARE OPTIONS FOR PATIENTS  [facility] / e-Visit: [email or web address]         [facility]: [phone]  [facility]: [phone]                   [facility]: [phone]   

## 2019-06-23 NOTE — Progress Notes (Signed)
Confirmed 5FU pump dose with MD. Patient received 2000mg /m2 with the last treatment. Continue 2000mg /m2 today and for all future treatments. Orders changed as discussed.  Hardie Pulley, PharmD, BCPS, BCOP

## 2019-06-23 NOTE — Progress Notes (Signed)
[facility]   Telephone:([phone]) [phone] Fax:(336) [phone]   Clinic Follow up Note   Patient Care Team: Wenda Low, MD as PCP - General (Internal Medicine) 06/23/2019  CHIEF COMPLAINT: Follow-up metastatic rectal cancer  SUMMARY OF ONCOLOGIC HISTORY: Oncology History Overview Note  Cancer Staging Rectal cancer [facility]) Staging form: Colon and Rectum, AJCC 8th Edition - Clinical stage from 09/29/2017: Stage IVA (cTX, cNX, cM1a) - Signed by Truitt Merle, MD on 03/21/2018    Rectal cancer ([place])  09/29/2017 Procedure   Colonoscopy by Dr. Penelope Coop 09/29/17  Findings:  The perianal and digital rectal examination were normal.  A non-obstructing large mass was found in the proximal rectum and in the mid rectum.  The mass was circumferential.  This was biopsied with a cold forceps for histology.  Area was tattooed with an injection of [place] ink both distal and proximal to the mass.  There is about 4-5 cm of rectal mucosa distal to the mass that looks norma. This mass is about 7-8 cm in length.  A 10 mm polyp was found in the ascending colon. The polyp was sessile. the polyp was removed with a hot snare.  Resection and retrieval were complete.     09/29/2017 Initial Biopsy   FINAL DIAGNOSIS 09/29/17  1. LG Intestine - Ascending Colon Polyp:  TUBULAR ADENOMA 2. LG Intestine - Rectum, Bx: INVASIVE MODERATELY DIFFERENTIATED ADENOCARCINOMA. THE MSI TESTING IS PENDING AND THE RESULT WILL BE REPORTED AS AN ADDENDUM.   09/29/2017 Cancer Staging   Staging form: Colon and Rectum, AJCC 8th Edition - Clinical stage from 09/29/2017: Stage IVA (cTX, cNX, cM1a) - Signed by Truitt Merle, MD on 03/21/2018   09/29/2017 Miscellaneous   Initial Biopsy MMR Results: MSI Stable     10/21/2017 Initial Diagnosis   Rectal cancer ([place])   10/28/2017 Imaging   CT CAP W Contrast  IMPRESSION: 1. Long segment circumferential wall thickening of the rectosigmoid colon consistent with known  adenocarcinoma. No evidence of bowel obstruction or perforation. 2. No definite signs of metastatic disease in the abdomen or pelvis. Soft tissue nodularity in the right pelvis is probably due to an unopacified pelvic vein. 3. Multiple pulmonary nodules, some cavitary. These are not typical of metastatic adenocarcinoma (especially without evidence of abdominal disease). Consider multicentric or metastatic lung cancer. PET-CT may be helpful to assess hypermetabolic activity and guided tissue sampling. 4. Multiple bladder calculi consistent with chronic bladder outlet obstruction from prostatomegaly. Nonobstructing right renal calculus. No hydronephrosis. 5. Aortic Atherosclerosis (ICD10-I70.0). Additional incidental findings including a moderate size hiatal hernia and renal cysts.    11/05/2017 - 12/18/2017 Radiation Therapy   Radiation with Dr. Lisbeth Renshaw With Xeloda   11/09/2017 - 12/18/2017 Chemotherapy   Chemoradiation with Xeloda 8105m/m2 q12h, M-F on days of radiation plan to starting 11/09/17   11/11/2017 Imaging   PET Scan  IMPRESSION:  Wall thickening/ mass involving the upper rectum, corresponding to the patient's known rectal adenocarcinoma. Bilateral pulmonary nodules, partially cavitary in the left upper lobe nodule, suspicious for metastases. Notably, cavitary metastases are more common with squamous cell carcinoma rather than the patient's known adenocarcinoma.    12/25/2018 Procedure   12/25/2018 Sigmoidoscopy Impression: - Malignant tumor in the proximal rectum, in the mid rectum and in the recto-sigmoid colon. Biopsied.     12/25/2018 Pathology Results   Diagnosis 12/25/18 Rectum, biopsy - ADENOCARCINOMA. Microscopic Comment   12/25/2018 Imaging   12/25/2018 CT AP IMPRESSION: 1. Wall thickening and enhancement of the rectum and distal  sigmoid colon, consistent with known rectal carcinoma. 2. New hypoattenuating lesion in the right hepatic lobe, concerning for  metastatic disease. 3. Increased size of posterior right lung base mass, now measuring 5.4 x 3.8 cm, previously 1.8 x 2.0 cm. Multiple right lower lobe pulmonary nodules, worsened since 10/28/2017, and consistent with metastatic disease. 4. Multiple large calculi within the urinary bladder.   01/12/2019 Pathology Results   Diagnosis Lung, biopsy - METASTATIC ADENOCARCINOMA WITH NECROSIS, CONSISTENT WITH PATIENT'S CLINICAL HISTORY OF PRIMARY COLORECTAL CARCINOMA.   01/12/2019 Miscellaneous   Foundation One 01/12/19  MSI-stable  KRAS G12V MRAS Wildtype (Codones 12, 13, 59, 61, 117, and 146 in exons 2, 3 and 4) Tumor Mutational Burden - 5 Muts/Mb Alk R1192Q APC W979GX*2 APC Splice Site 1194+1D>E ASXL1 R1415* MEN1 MEN1([id number])  SF3B1 K666N TP53 R273C   01/20/2019 Imaging   CT Chest Wo Contrast  IMPRESSION: Multiple bilateral pulmonary metastases, including index lesions as above, progressive from 2018.  Aortic Atherosclerosis (ICD10-I70.0) and Emphysema (ICD10-J43.9).    01/20/2019 -  Chemotherapy   First line FOLFOX every 2 weeks      CURRENT THERAPY: First lineFOLFOX every 2 weeks, starting 01/20/2019. Added Avastin with cycle 4  INTERVAL HISTORY: Mr. Dumlao returns for follow-up and treatment as scheduled.  He completed cycle 8 on 06/02/2019.  Treatment was changed to 3-week interval.  He tolerated this better.  He has fatigue and weakness for 3 to 4 days following pump DC.  Appetite remains normal.  He has ongoing diarrhea, 2 episodes per day, takes Lomotil.  Denies blood in stool, denies nausea or vomiting.  Denies mucositis.  Denies recent fever, chills, cough, chest pain, dyspnea, leg edema..  Denies neuropathy.  Denies abdominal or other pain.    MEDICAL HISTORY:  Past Medical History:  Diagnosis Date  . Cancer ([place]) 09/29/2017   Rectal cvancer  . High cholesterol     SURGICAL HISTORY: Past Surgical History:  Procedure Laterality Date  . BIOPSY  12/25/2018    Procedure: BIOPSY;  Surgeon: Wonda Horner, MD;  Location: [facility] ENDOSCOPY;  Service: Endoscopy;;  . FLEXIBLE SIGMOIDOSCOPY N/A 12/25/2018   Procedure: Beryle Quant;  Surgeon: Wonda Horner, MD;  Location: [facility] ENDOSCOPY;  Service: Endoscopy;  Laterality: N/A;  . IR IMAGING GUIDED PORT INSERTION  01/11/2019    I have reviewed the social history and family history with the patient and they are unchanged from previous note.  ALLERGIES:  has No Known Allergies.  MEDICATIONS:  Current Outpatient Medications  Medication Sig Dispense Refill  . finasteride (PROSCAR) 5 MG tablet Take 5 mg by mouth daily.     Marland Kitchen lovastatin (MEVACOR) 40 MG tablet Take 40 mg by mouth daily.     . diphenoxylate-atropine (LOMOTIL) 2.5-0.025 MG tablet Take 1-2 tablets by mouth 4 (four) times daily as needed for diarrhea or loose stools. 60 tablet 1  . lidocaine-prilocaine (EMLA) cream Apply to affected area once 30 g 3  . naproxen sodium (ALEVE) 220 MG tablet Take 220 mg by mouth daily as needed (pain).    . ondansetron (ZOFRAN) 8 MG tablet Take 1 tablet (8 mg total) by mouth 2 (two) times daily as needed for refractory nausea / vomiting. Start on day 3 after chemotherapy. 30 tablet 1  . prochlorperazine (COMPAZINE) 10 MG tablet Take 1 tablet (10 mg total) by mouth every 6 (six) hours as needed (Nausea or vomiting). 30 tablet 1   No current facility-administered medications for this visit.    Facility-Administered Medications  Ordered in Other Visits  Medication Dose Route Frequency Provider Last Rate Last Dose  . bevacizumab (AVASTIN) 350 mg in sodium chloride 0.9 % 100 mL chemo infusion  5 mg/kg (Treatment Plan Recorded) Intravenous Once Truitt Merle, MD      . dexamethasone (DECADRON) injection 10 mg  10 mg Intravenous Once Truitt Merle, MD      . dextrose 5 % solution   Intravenous Once Truitt Merle, MD      . dextrose 5 % solution   Intravenous Once Truitt Merle, MD      . fluorouracil (ADRUCIL) 4,500 mg in sodium chloride  0.9 % 60 mL chemo infusion  2,400 mg/m2 (Treatment Plan Recorded) Intravenous 1 day or 1 dose Truitt Merle, MD      . heparin lock flush 100 unit/mL  500 Units Intravenous Once Truitt Merle, MD      . leucovorin 748 mg in dextrose 5 % 250 mL infusion  400 mg/m2 (Treatment Plan Recorded) Intravenous Once Truitt Merle, MD      . oxaliplatin (ELOXATIN) 110 mg in dextrose 5 % 500 mL chemo infusion  60 mg/m2 (Treatment Plan Recorded) Intravenous Once Truitt Merle, MD      . palonosetron (ALOXI) injection 0.25 mg  0.25 mg Intravenous Once Truitt Merle, MD        PHYSICAL EXAMINATION: ECOG PERFORMANCE STATUS: 1 - Symptomatic but completely ambulatory  Vitals:   06/23/19 1051 06/23/19 1115  BP: (!) 143/99 133/89  Pulse: 80   Resp: 18   Temp: 98.9 F (37.2 C)    Filed Weights   06/23/19 1051  Weight: 152 lb 4.8 oz (69.1 kg)    GENERAL:alert, no distress and comfortable SKIN: No obvious skin rash EYES:  sclera clear LUNGS: Respirations even and nonlabored HEART:  no lower extremity edema ABDOMEN:abdomen soft, non-tender and normal bowel sounds.  No hepatomegaly Musculoskeletal:no cyanosis of digits  NEURO: alert & oriented x 3 with fluent speech, normal gait PAC without erythema Limited exam for COVID-19 outbreak  LABORATORY DATA:  I have reviewed the data as listed CBC Latest Ref Rng & Units 06/23/2019 06/02/2019 05/05/2019  WBC 4.0 - 10.5 K/uL 3.9(L) 5.8 5.9  Hemoglobin 13.0 - 17.0 g/dL 13.3 13.7 13.6  Hematocrit 39.0 - 52.0 % 40.6 41.8 41.9  Platelets 150 - 400 K/uL 147(L) 132(L) 183     CMP Latest Ref Rng & Units 06/23/2019 06/02/2019 05/05/2019  Glucose 70 - 99 mg/dL 102(H) 93 103(H)  BUN 8 - 23 mg/dL _0 Creatinine 0.61 - 1.24 mg/dL 0.95 1.19 1.08  Sodium 135 - 145 mmol/L 143 143 144  Potassium 3.5 - 5.1 mmol/L 3.7 3.7 3.5  Chloride 98 - 111 mmol/L 108 108 111  CO2 22 - 32 mmol/L _1 Calcium 8.9 - 10.3 mg/dL 9.0 9.2 9.2  Total Protein 6.5 - 8.1 g/dL 7.0 6.9 6.8  Total  Bilirubin 0.3 - 1.2 mg/dL 0.7 0.6 0.5  Alkaline Phos 38 - 126 U/L 76 74 70  AST 15 - 41 U/L _2 ALT 0 - 44 U/L _3 RADIOGRAPHIC STUDIES: I have personally reviewed the radiological images as listed and agreed with the findings in the report. No results found.   ASSESSMENT & PLAN: LORA GLOMSKI a 85 y.o.malewith   1. Rectal Cancer, invasive adenocarcinoma,with lung and liver metastasis, cTxNxM1, stage IV, MSS, KRAS mutation(+) 2. Anemia,secondary to rectal bleeding andiron deficiency; s/p IV  iron with good response  3. Diarrhea, Rectal Bleeding secondary to rectal cancer recurrence - denies bleeding currently  4.Goal of care discussion- agreeable with DNR/DNI per previous discussion with Dr. Burr Medico 5. Dementia, no formal work up   [employer]: Mr. Ollis appears well.  He completed cycle 8 FOLFOX and Avastin 3 weeks ago.  He tolerated better with longer interval; he has recovered well from fatigue and weakness following treatment. Diarrhea appears controlled with lomotil. BP improved when checked manually. Labs reviewed, CBC and CMP adequate for treatment. Iron panel from today is pending, but in good range on 06/02/19; CEA pending. He will proceed with cycle 9 FOLFOX and Avastin today, no dosage adjustments.  He will return in 3 weeks for follow-up and next cycle.  Plan to restage after that. I ordered CT CAP today. Patient declined me calling family to update.   All questions were answered. The patient knows to call the clinic with any problems, questions or concerns. No barriers to learning was detected.     Alla Feeling, NP 06/23/19

## 2019-06-24 ENCOUNTER — Telehealth: Payer: Self-pay | Admitting: Nurse Practitioner

## 2019-06-24 NOTE — Telephone Encounter (Signed)
No los per 7/16. °

## 2019-06-25 ENCOUNTER — Other Ambulatory Visit: Payer: Self-pay

## 2019-06-25 ENCOUNTER — Inpatient Hospital Stay: Payer: Medicare Other

## 2019-06-25 VITALS — BP 161/85 | HR 82 | Temp 99.1°F | Resp 18

## 2019-06-25 DIAGNOSIS — C20 Malignant neoplasm of rectum: Secondary | ICD-10-CM

## 2019-06-25 DIAGNOSIS — Z5111 Encounter for antineoplastic chemotherapy: Secondary | ICD-10-CM | POA: Diagnosis not present

## 2019-06-25 MED ORDER — HEPARIN SOD (PORK) LOCK FLUSH 100 UNIT/ML IV SOLN
500.0000 [IU] | Freq: Once | INTRAVENOUS | Status: AC | PRN
  Administered 2019-06-25: 500 [IU]
  Filled 2019-06-25: qty 5

## 2019-06-25 MED ORDER — SODIUM CHLORIDE 0.9% FLUSH
10.0000 mL | INTRAVENOUS | Status: DC | PRN
  Administered 2019-06-25: 10 mL
  Filled 2019-06-25: qty 10

## 2019-07-11 NOTE — Progress Notes (Signed)
Adrian Banks   Telephone:([phone]) [phone] Fax:(336) [phone]   Clinic Follow up Note   Patient Care Team: Wenda Low, MD as PCP - General (Internal Medicine)  Date of Service:  07/14/2019  CHIEF COMPLAINT: F/u on metastatic rectal cancer  SUMMARY OF ONCOLOGIC HISTORY: Oncology History Overview Note  Cancer Staging Rectal cancer [facility]) Staging form: Colon and Rectum, AJCC 8th Edition - Clinical stage from 09/29/2017: Stage IVA (cTX, cNX, cM1a) - Signed by Truitt Merle, MD on 03/21/2018    Rectal cancer ([place])  09/29/2017 Procedure   Colonoscopy by Dr. Penelope Coop 09/29/17  Findings:  The perianal and digital rectal examination were normal.  A non-obstructing large mass was found in the proximal rectum and in the mid rectum.  The mass was circumferential.  This was biopsied with a cold forceps for histology.  Area was tattooed with an injection of [place] ink both distal and proximal to the mass.  There is about 4-5 cm of rectal mucosa distal to the mass that looks norma. This mass is about 7-8 cm in length.  A 10 mm polyp was found in the ascending colon. The polyp was sessile. the polyp was removed with a hot snare.  Resection and retrieval were complete.     09/29/2017 Initial Biopsy   FINAL DIAGNOSIS 09/29/17  1. LG Intestine - Ascending Colon Polyp:  TUBULAR ADENOMA 2. LG Intestine - Rectum, Bx: INVASIVE MODERATELY DIFFERENTIATED ADENOCARCINOMA. THE MSI TESTING IS PENDING AND THE RESULT WILL BE REPORTED AS AN ADDENDUM.   09/29/2017 Cancer Staging   Staging form: Colon and Rectum, AJCC 8th Edition - Clinical stage from 09/29/2017: Stage IVA (cTX, cNX, cM1a) - Signed by Truitt Merle, MD on 03/21/2018   09/29/2017 Miscellaneous   Initial Biopsy MMR Results: MSI Stable     10/21/2017 Initial Diagnosis   Rectal cancer ([place])   10/28/2017 Imaging   CT CAP W Contrast  IMPRESSION: 1. Long segment circumferential wall thickening of the rectosigmoid colon consistent with  known adenocarcinoma. No evidence of bowel obstruction or perforation. 2. No definite signs of metastatic disease in the abdomen or pelvis. Soft tissue nodularity in the right pelvis is probably due to an unopacified pelvic vein. 3. Multiple pulmonary nodules, some cavitary. These are not typical of metastatic adenocarcinoma (especially without evidence of abdominal disease). Consider multicentric or metastatic lung cancer. PET-CT may be helpful to assess hypermetabolic activity and guided tissue sampling. 4. Multiple bladder calculi consistent with chronic bladder outlet obstruction from prostatomegaly. Nonobstructing right renal calculus. No hydronephrosis. 5. Aortic Atherosclerosis (ICD10-I70.0). Additional incidental findings including a moderate size hiatal hernia and renal cysts.    11/05/2017 - 12/18/2017 Radiation Therapy   Radiation with Dr. Lisbeth Renshaw With Xeloda   11/09/2017 - 12/18/2017 Chemotherapy   Chemoradiation with Xeloda 879m/m2 q12h, M-F on days of radiation plan to starting 11/09/17   11/11/2017 Imaging   PET Scan  IMPRESSION:  Wall thickening/ mass involving the upper rectum, corresponding to the patient's known rectal adenocarcinoma. Bilateral pulmonary nodules, partially cavitary in the left upper lobe nodule, suspicious for metastases. Notably, cavitary metastases are more common with squamous cell carcinoma rather than the patient's known adenocarcinoma.    12/25/2018 Procedure   12/25/2018 Sigmoidoscopy Impression: - Malignant tumor in the proximal rectum, in the mid rectum and in the recto-sigmoid colon. Biopsied.     12/25/2018 Pathology Results   Diagnosis 12/25/18 Rectum, biopsy - ADENOCARCINOMA. Microscopic Comment   12/25/2018 Imaging   12/25/2018 CT AP IMPRESSION: 1. Wall thickening and  enhancement of the rectum and distal sigmoid colon, consistent with known rectal carcinoma. 2. New hypoattenuating lesion in the right hepatic lobe, concerning for  metastatic disease. 3. Increased size of posterior right lung base mass, now measuring 5.4 x 3.8 cm, previously 1.8 x 2.0 cm. Multiple right lower lobe pulmonary nodules, worsened since 10/28/2017, and consistent with metastatic disease. 4. Multiple large calculi within the urinary bladder.   01/12/2019 Pathology Results   Diagnosis Lung, biopsy - METASTATIC ADENOCARCINOMA WITH NECROSIS, CONSISTENT WITH PATIENT'S CLINICAL HISTORY OF PRIMARY COLORECTAL CARCINOMA.   01/12/2019 Miscellaneous   Foundation One 01/12/19  MSI-stable  KRAS G12V MRAS Wildtype (Codones 12, 13, 59, 61, 117, and 146 in exons 2, 3 and 4) Tumor Mutational Burden - 5 Muts/Mb Alk R1192Q APC [id number]*0 APC Splice Site 4540+9W>J ASXL1 R1415* MEN1 MEN1([id number])  SF3B1 K666N TP53 R273C   01/20/2019 Imaging   CT Chest Wo Contrast  IMPRESSION: Multiple bilateral pulmonary metastases, including index lesions as above, progressive from 2018.  Aortic Atherosclerosis (ICD10-I70.0) and Emphysema (ICD10-J43.9).    01/20/2019 -  Chemotherapy   First line FOLFOX  every 2 weeks, starting 01/20/2019. Added Avastin with cycle 4. Switched to every 3 weeks starting 05/05/19.       CURRENT THERAPY:  First lineFOLFOX every 2 weeks, starting 01/20/2019. Added Avastin with cycle 4. Switched to every 3 weeks starting 05/05/19.   INTERVAL HISTORY:  Adrian Banks is here for a follow up and treatment. He presents to the clinic alone. He notes no significant issues from last chemo. He denies neuropathy. He said it took 2-3 days to recover from infusion. He denies any other concerns. His BM is 4 times a day. He is using imodium. He denies blood in stool. He denies nausea. He feels more foggy after chemo.    REVIEW OF SYSTEMS:   Constitutional: Denies fevers, chills or abnormal weight loss (+) chemo brain Eyes: Denies blurriness of vision Ears, nose, mouth, throat, and face: Denies mucositis or sore throat Respiratory: Denies  cough, dyspnea or wheezes Cardiovascular: Denies palpitation, chest discomfort or lower extremity swelling Gastrointestinal:  Denies nausea, heartburn (+) controlled diarrhea  Skin: Denies abnormal skin rashes Lymphatics: Denies new lymphadenopathy or easy bruising Neurological:Denies numbness, tingling or new weaknesses Behavioral/Psych: Mood is stable, no new changes  All other systems were reviewed with the patient and are negative.  MEDICAL HISTORY:  Past Medical History:  Diagnosis Date  . Cancer ([place]) 09/29/2017   Rectal cvancer  . High cholesterol     SURGICAL HISTORY: Past Surgical History:  Procedure Laterality Date  . BIOPSY  12/25/2018   Procedure: BIOPSY;  Surgeon: Wonda Horner, MD;  Location[facility] ENDOSCOPY;  Service: Endoscopy;;  . FLEXIBLE SIGMOIDOSCOPY N/A 12/25/2018   Procedure: Beryle Quant;  Surgeon: Wonda Horner, MD;  Location: [facility] ENDOSCOPY;  Service: Endoscopy;  Laterality: N/A;  . IR IMAGING GUIDED PORT INSERTION  01/11/2019    I have reviewed the social history and family history with the patient and they are unchanged from previous note.  ALLERGIES:  has No Known Allergies.  MEDICATIONS:  Current Outpatient Medications  Medication Sig Dispense Refill  . diphenoxylate-atropine (LOMOTIL) 2.5-0.025 MG tablet Take 1-2 tablets by mouth 4 (four) times daily as needed for diarrhea or loose stools. 60 tablet 1  . finasteride (PROSCAR) 5 MG tablet Take 5 mg by mouth daily.     Marland Kitchen lidocaine-prilocaine (EMLA) cream Apply to affected area once 30 g 3  . lovastatin (MEVACOR) 40 MG  tablet Take 40 mg by mouth daily.     . naproxen sodium (ALEVE) 220 MG tablet Take 220 mg by mouth daily as needed (pain).    . ondansetron (ZOFRAN) 8 MG tablet Take 1 tablet (8 mg total) by mouth 2 (two) times daily as needed for refractory nausea / vomiting. Start on day 3 after chemotherapy. 30 tablet 1  . prochlorperazine (COMPAZINE) 10 MG tablet Take 1 tablet (10 mg total) by  mouth every 6 (six) hours as needed (Nausea or vomiting). 30 tablet 1   No current facility-administered medications for this visit.    Facility-Administered Medications Ordered in Other Visits  Medication Dose Route Frequency Provider Last Rate Last Dose  . heparin lock flush 100 unit/mL  500 Units Intravenous Once Truitt Merle, MD        PHYSICAL EXAMINATION: ECOG PERFORMANCE STATUS: 1 - Symptomatic but completely ambulatory  Vitals with BMI 06/25/2019  Height   Weight   BMI   Systolic 109  Diastolic 85  Pulse 82  Respirations 18    GENERAL:alert, no distress and comfortable SKIN: skin color, texture, turgor are normal, no rashes or significant lesions EYES: normal, Conjunctiva are pink and non-injected, sclera clear  NECK: supple, thyroid normal size, non-tender, without nodularity LYMPH:  no palpable lymphadenopathy in the cervical, axillary  LUNGS: clear to auscultation and percussion with normal breathing effort HEART: regular rate & rhythm and no murmurs and no lower extremity edema ABDOMEN:abdomen soft, non-tender and normal bowel sounds Musculoskeletal:no cyanosis of digits and no clubbing  NEURO: alert & oriented x 3 with fluent speech, no focal motor/sensory deficits  LABORATORY DATA:  I have reviewed the data as listed CBC Latest Ref Rng & Units 07/14/2019 06/23/2019 06/02/2019  WBC 4.0 - 10.5 K/uL 4.0 3.9(L) 5.8  Hemoglobin 13.0 - 17.0 g/dL 13.3 13.3 13.7  Hematocrit 39.0 - 52.0 % 40.5 40.6 41.8  Platelets 150 - 400 K/uL 165 147(L) 132(L)     CMP Latest Ref Rng & Units 07/14/2019 06/23/2019 06/02/2019  Glucose 70 - 99 mg/dL 98 102(H) 93  BUN 8 - 23 mg/dL _0 Creatinine 0.61 - 1.24 mg/dL 1.07 0.95 1.19  Sodium 135 - 145 mmol/L 142 143 143  Potassium 3.5 - 5.1 mmol/L 3.8 3.7 3.7  Chloride 98 - 111 mmol/L 107 108 108  CO2 22 - 32 mmol/L _1 Calcium 8.9 - 10.3 mg/dL 9.4 9.0 9.2  Total Protein 6.5 - 8.1 g/dL 7.0 7.0 6.9  Total Bilirubin 0.3 - 1.2 mg/dL 0.7  0.7 0.6  Alkaline Phos 38 - 126 U/L 77 76 74  AST 15 - 41 U/L _2 ALT 0 - 44 U/L _3 RADIOGRAPHIC STUDIES: I have personally reviewed the radiological images as listed and agreed with the findings in the report. No results found.   ASSESSMENT & PLAN:  Adrian Banks is a 85 y.o. male with   1. Rectal Cancer, invasive adenocarcinoma,with lung and liver metastasis, cTxNxM1, stage IV, MSS, KRAS mutation(+) -Diagnosed in 09/2017.His initial staging scans showedbilateral lung nodules, highly suspicious for lung metastasis.He declined biopsy of his lung lesions. He was treatedwith radiation andoralchemoXelodato his primary rectal cancer.I last saw him in 03/2018 and he lost f/u since then.His 12/25/18 rectal biopsy and2/5/20 Lung biopsy has confirmedrectal caner recurrence with lungmetastasis. -He started FOLFOX every 2 weeks on 01/20/19.Due to his advanced age, I reduced chemo dose. He tolerated well.I added biological  agent Avastin starting with cycle 4. -His FO results shows MSI-stable.KRAS mutation.He is not eligible for immunotherapy or EGFR inhibitor. -Ipreviouslydiscussed he will continue chemo for as long as he can tolerate and it controls his disease. -Due to poor tolerance to chemo lately, I have reduced FOLFOX dose changed to every 3 weeks starting 05/05/19. May switch to Xeloda and Avastinmaintenance therapy soon.  -Labs reviewed, CBC and CMP WNL. Overall adequate to proceed with FOLFOX and Avastin today.  -Will proceed with CT CAP on 07/21/19. May start maintenance therapy after scan if he had good response. -F/u in 2 weeks   2. Anemia,secondary to rectal bleeding andiron deficiency -new sinceearly 2019  -probably related to his rectal cancer bleeding -will check iron studyperiodically. -S/p2doses of IV iron recently,he responded well.  -Anemia resolved lately  3. Diarrhea, Rectal Bleeding  -Secondary to rectal cancer recurrence   -Hematochezia has resolved  -Diarrhea mildly improving with Imodium, he also has lomotil. He has bowel movementsabout 4 times a day, no loose stool.   4.Goal of care discussion -We again discussed the incurable nature ofhiscancer, and the overall poor prognosis, especially if he does not have good response to chemotherapy or progresses. -The patient understands the goal of care is palliative. -I recommend DNR/DNI,he is agreeable with DNR/DNI. I advised him to discuss this with his wife and daughter.  5. Dementia -Pt has appeared to havesome degree of cognitive dysfunction since a saw him the first time, this is much worsenow, I think he needs further evaluation  -I previously discussed with his wife. She states pt does not talk much at home, he functions well, she have not noticed any change of his memory orbehavioral issues. -Will also call his daughter and inform her, and I will encourage his family to bring him to see his PCP for dementia evaluation -Pt feels more forgetful with chemo brain lately  6. Neuropathy, Imbalance  -He has been dropping things due to numbness in fingers and has right leg weakness -I have reduced chemo dose and plan to switch to maintenance therapy soon.  -overall stable lately    Plan -Labs reviewed and adequate to proceed with FOLFOX and Avastin  -CT CAP W Contrast on 07/21/19  -Lab and f/u in 2 weeks     No problem-specific Assessment & Plan notes found for this encounter.   No orders of the defined types were placed in this encounter.  All questions were answered. The patient knows to call the clinic with any problems, questions or concerns. No barriers to learning was detected. I spent 20 minutes counseling the patient face to face. The total time spent in the appointment was 25 minutes and more than 50% was on counseling and review of test results     Truitt Merle, MD 07/14/2019   I, Joslyn Devon, am acting as scribe for Truitt Merle, MD.   I have reviewed the above documentation for accuracy and completeness, and I agree with the above.

## 2019-07-12 ENCOUNTER — Telehealth: Payer: Self-pay | Admitting: *Deleted

## 2019-07-12 NOTE — Telephone Encounter (Signed)
Received call from patient seeking to clarify appts for this week and next week.  Reviewed appts and reviewed picking up oral contrast for his scan on August 13th. Pt voiced understanding.

## 2019-07-14 ENCOUNTER — Other Ambulatory Visit: Payer: Self-pay

## 2019-07-14 ENCOUNTER — Encounter: Payer: Self-pay | Admitting: Hematology

## 2019-07-14 ENCOUNTER — Inpatient Hospital Stay: Payer: Medicare Other

## 2019-07-14 ENCOUNTER — Inpatient Hospital Stay (HOSPITAL_BASED_OUTPATIENT_CLINIC_OR_DEPARTMENT_OTHER): Payer: Medicare Other | Admitting: Hematology

## 2019-07-14 ENCOUNTER — Inpatient Hospital Stay: Payer: Medicare Other | Attending: Hematology

## 2019-07-14 VITALS — BP 163/81 | HR 67 | Temp 98.5°F | Resp 20

## 2019-07-14 DIAGNOSIS — C20 Malignant neoplasm of rectum: Secondary | ICD-10-CM | POA: Diagnosis not present

## 2019-07-14 DIAGNOSIS — F0390 Unspecified dementia without behavioral disturbance: Secondary | ICD-10-CM | POA: Insufficient documentation

## 2019-07-14 DIAGNOSIS — Z79899 Other long term (current) drug therapy: Secondary | ICD-10-CM | POA: Diagnosis not present

## 2019-07-14 DIAGNOSIS — G629 Polyneuropathy, unspecified: Secondary | ICD-10-CM | POA: Diagnosis not present

## 2019-07-14 DIAGNOSIS — R197 Diarrhea, unspecified: Secondary | ICD-10-CM

## 2019-07-14 DIAGNOSIS — Z923 Personal history of irradiation: Secondary | ICD-10-CM | POA: Insufficient documentation

## 2019-07-14 DIAGNOSIS — D500 Iron deficiency anemia secondary to blood loss (chronic): Secondary | ICD-10-CM

## 2019-07-14 DIAGNOSIS — Z5111 Encounter for antineoplastic chemotherapy: Secondary | ICD-10-CM | POA: Diagnosis not present

## 2019-07-14 DIAGNOSIS — C787 Secondary malignant neoplasm of liver and intrahepatic bile duct: Secondary | ICD-10-CM | POA: Insufficient documentation

## 2019-07-14 DIAGNOSIS — C7802 Secondary malignant neoplasm of left lung: Secondary | ICD-10-CM | POA: Diagnosis not present

## 2019-07-14 DIAGNOSIS — Z95828 Presence of other vascular implants and grafts: Secondary | ICD-10-CM

## 2019-07-14 LAB — COMPREHENSIVE METABOLIC PANEL
ALT: 19 U/L (ref 0–44)
AST: 25 U/L (ref 15–41)
Albumin: 3.7 g/dL (ref 3.5–5.0)
Alkaline Phosphatase: 77 U/L (ref 38–126)
Anion gap: 12 (ref 5–15)
BUN: 22 mg/dL (ref 8–23)
CO2: 23 mmol/L (ref 22–32)
Calcium: 9.4 mg/dL (ref 8.9–10.3)
Chloride: 107 mmol/L (ref 98–111)
Creatinine, Ser: 1.07 mg/dL (ref 0.61–1.24)
GFR calc Af Amer: >60 mL/min (ref >60)
GFR calc non Af Amer: >60 mL/min (ref >60)
Glucose, Bld: 98 mg/dL (ref 70–99)
Potassium: 3.8 mmol/L (ref 3.5–5.1)
Sodium: 142 mmol/L (ref 135–145)
Total Bilirubin: 0.7 mg/dL (ref 0.3–1.2)
Total Protein: 7 g/dL (ref 6.5–8.1)

## 2019-07-14 LAB — CBC WITH DIFFERENTIAL/PLATELET
Abs Immature Granulocytes: 0 10*3/uL (ref 0.00–0.07)
Basophils Absolute: 0.1 10*3/uL (ref 0.0–0.1)
Basophils Relative: 2 %
Eosinophils Absolute: 0.2 10*3/uL (ref 0.0–0.5)
Eosinophils Relative: 5 %
HCT: 40.5 % (ref 39.0–52.0)
Hemoglobin: 13.3 g/dL (ref 13.0–17.0)
Immature Granulocytes: 0 %
Lymphocytes Relative: 22 %
Lymphs Abs: 0.9 10*3/uL (ref 0.7–4.0)
MCH: 30.9 pg (ref 26.0–34.0)
MCHC: 32.8 g/dL (ref 30.0–36.0)
MCV: 94.2 fL (ref 80.0–100.0)
Monocytes Absolute: 0.9 10*3/uL (ref 0.1–1.0)
Monocytes Relative: 22 %
Neutro Abs: 2 10*3/uL (ref 1.7–7.7)
Neutrophils Relative %: 49 %
Platelets: 165 10*3/uL (ref 150–400)
RBC: 4.3 MIL/uL (ref 4.22–5.81)
RDW: 15.6 % — ABNORMAL HIGH (ref 11.5–15.5)
WBC: 4 10*3/uL (ref 4.0–10.5)
nRBC: 0 % (ref 0.0–0.2)

## 2019-07-14 LAB — TOTAL PROTEIN, URINE DIPSTICK: Protein, ur: NEGATIVE mg/dL

## 2019-07-14 MED ORDER — SODIUM CHLORIDE 0.9% FLUSH
10.0000 mL | INTRAVENOUS | Status: DC | PRN
  Filled 2019-07-14: qty 10

## 2019-07-14 MED ORDER — HEPARIN SOD (PORK) LOCK FLUSH 100 UNIT/ML IV SOLN
250.0000 [IU] | Freq: Once | INTRAVENOUS | Status: DC | PRN
  Filled 2019-07-14: qty 5

## 2019-07-14 MED ORDER — PALONOSETRON HCL INJECTION 0.25 MG/5ML
0.2500 mg | Freq: Once | INTRAVENOUS | Status: AC
  Administered 2019-07-14: 11:00:00 0.25 mg via INTRAVENOUS

## 2019-07-14 MED ORDER — HEPARIN SOD (PORK) LOCK FLUSH 100 UNIT/ML IV SOLN
500.0000 [IU] | Freq: Once | INTRAVENOUS | Status: DC | PRN
  Filled 2019-07-14: qty 5

## 2019-07-14 MED ORDER — SODIUM CHLORIDE 0.9 % IV SOLN
2000.0000 mg/m2 | INTRAVENOUS | Status: DC
  Administered 2019-07-14: 3750 mg via INTRAVENOUS
  Filled 2019-07-14: qty 75

## 2019-07-14 MED ORDER — OXALIPLATIN CHEMO INJECTION 100 MG/20ML
60.0000 mg/m2 | Freq: Once | INTRAVENOUS | Status: AC
  Administered 2019-07-14: 110 mg via INTRAVENOUS
  Filled 2019-07-14: qty 20

## 2019-07-14 MED ORDER — DEXTROSE 5 % IV SOLN
Freq: Once | INTRAVENOUS | Status: AC
  Administered 2019-07-14: 13:00:00 via INTRAVENOUS
  Filled 2019-07-14: qty 250

## 2019-07-14 MED ORDER — SODIUM CHLORIDE 0.9% FLUSH
10.0000 mL | Freq: Once | INTRAVENOUS | Status: AC
  Administered 2019-07-14: 10 mL
  Filled 2019-07-14: qty 10

## 2019-07-14 MED ORDER — SODIUM CHLORIDE 0.9 % IV SOLN
5.0000 mg/kg | Freq: Once | INTRAVENOUS | Status: AC
  Administered 2019-07-14: 350 mg via INTRAVENOUS
  Filled 2019-07-14: qty 14

## 2019-07-14 MED ORDER — DEXAMETHASONE SODIUM PHOSPHATE 10 MG/ML IJ SOLN
INTRAMUSCULAR | Status: AC
  Filled 2019-07-14: qty 1

## 2019-07-14 MED ORDER — DEXAMETHASONE SODIUM PHOSPHATE 10 MG/ML IJ SOLN
10.0000 mg | Freq: Once | INTRAMUSCULAR | Status: AC
  Administered 2019-07-14: 11:00:00 10 mg via INTRAVENOUS

## 2019-07-14 MED ORDER — LEUCOVORIN CALCIUM INJECTION 350 MG
400.0000 mg/m2 | Freq: Once | INTRAVENOUS | Status: AC
  Administered 2019-07-14: 748 mg via INTRAVENOUS
  Filled 2019-07-14: qty 37.4

## 2019-07-14 MED ORDER — PALONOSETRON HCL INJECTION 0.25 MG/5ML
INTRAVENOUS | Status: AC
  Filled 2019-07-14: qty 5

## 2019-07-14 MED ORDER — SODIUM CHLORIDE 0.9 % IV SOLN
INTRAVENOUS | Status: DC
  Administered 2019-07-14: 11:00:00 via INTRAVENOUS
  Filled 2019-07-14: qty 250

## 2019-07-14 NOTE — Patient Instructions (Signed)

## 2019-07-14 NOTE — Patient Instructions (Signed)
[place] Discharge Instructions for Patients Receiving Chemotherapy  Today you received the following chemotherapy agents: Avastin, Oxaliplatin, Leucovorin, 5FU pump   To help prevent nausea and vomiting after your treatment, we encourage you to take your nausea medication as directed.    If you develop nausea and vomiting that is not controlled by your nausea medication, call the clinic.   BELOW ARE SYMPTOMS THAT SHOULD BE REPORTED IMMEDIATELY:  *FEVER GREATER THAN 100.5 F  *CHILLS WITH OR WITHOUT FEVER  NAUSEA AND VOMITING THAT IS NOT CONTROLLED WITH YOUR NAUSEA MEDICATION  *UNUSUAL SHORTNESS OF BREATH  *UNUSUAL BRUISING OR BLEEDING  TENDERNESS IN MOUTH AND THROAT WITH OR WITHOUT PRESENCE OF ULCERS  *URINARY PROBLEMS  *BOWEL PROBLEMS  UNUSUAL RASH Items with * indicate a potential emergency and should be followed up as soon as possible.  Feel free to call the clinic should you have any questions or concerns. The clinic phone number is [phone].  Please show the [place] at check-in to the Emergency Department and triage nurse.

## 2019-07-16 ENCOUNTER — Other Ambulatory Visit: Payer: Self-pay

## 2019-07-16 ENCOUNTER — Inpatient Hospital Stay: Payer: Medicare Other

## 2019-07-16 VITALS — BP 151/69 | HR 83 | Temp 99.1°F | Resp 20

## 2019-07-16 DIAGNOSIS — Z5111 Encounter for antineoplastic chemotherapy: Secondary | ICD-10-CM | POA: Diagnosis not present

## 2019-07-16 DIAGNOSIS — C20 Malignant neoplasm of rectum: Secondary | ICD-10-CM

## 2019-07-16 MED ORDER — HEPARIN SOD (PORK) LOCK FLUSH 100 UNIT/ML IV SOLN
500.0000 [IU] | Freq: Once | INTRAVENOUS | Status: AC | PRN
  Administered 2019-07-16: 13:00:00 500 [IU]
  Filled 2019-07-16: qty 5

## 2019-07-16 MED ORDER — SODIUM CHLORIDE 0.9% FLUSH
10.0000 mL | INTRAVENOUS | Status: DC | PRN
  Administered 2019-07-16: 10 mL
  Filled 2019-07-16: qty 10

## 2019-07-20 ENCOUNTER — Telehealth: Payer: Self-pay | Admitting: Hematology

## 2019-07-20 NOTE — Telephone Encounter (Signed)
Per 8/6 no los.

## 2019-07-21 ENCOUNTER — Other Ambulatory Visit: Payer: Self-pay

## 2019-07-21 ENCOUNTER — Encounter (HOSPITAL_COMMUNITY): Payer: Self-pay

## 2019-07-21 ENCOUNTER — Ambulatory Visit (HOSPITAL_COMMUNITY)
Admission: RE | Admit: 2019-07-21 | Discharge: 2019-07-21 | Disposition: A | Discharge status: Home / Self Care | Payer: Medicare Other | Source: Ambulatory Visit | Attending: Nurse Practitioner | Admitting: Nurse Practitioner

## 2019-07-21 ENCOUNTER — Telehealth: Payer: Self-pay | Admitting: Hematology

## 2019-07-21 DIAGNOSIS — D490 Neoplasm of unspecified behavior of digestive system: Secondary | ICD-10-CM | POA: Diagnosis present

## 2019-07-21 MED ORDER — HEPARIN SOD (PORK) LOCK FLUSH 100 UNIT/ML IV SOLN
500.0000 [IU] | Freq: Once | INTRAVENOUS | Status: AC
  Administered 2019-07-21: 500 [IU] via INTRAVENOUS

## 2019-07-21 MED ORDER — IOHEXOL 300 MG/ML  SOLN
100.0000 mL | Freq: Once | INTRAMUSCULAR | Status: AC | PRN
  Administered 2019-07-21: 100 mL via INTRAVENOUS

## 2019-07-21 MED ORDER — SODIUM CHLORIDE (PF) 0.9 % IJ SOLN
INTRAMUSCULAR | Status: AC
  Filled 2019-07-21: qty 50

## 2019-07-21 MED ORDER — HEPARIN SOD (PORK) LOCK FLUSH 100 UNIT/ML IV SOLN
INTRAVENOUS | Status: AC
  Filled 2019-07-21: qty 5

## 2019-07-21 NOTE — Telephone Encounter (Signed)
Scheduled apt per 8/13 sh message - spoke with patient and they did not want appts at the moment - they want someone to call them about appts because they thought they were done with treatments    Message sent to MD and RN

## 2019-07-25 NOTE — Progress Notes (Signed)
[place]   Telephone:([phone]) [phone] Fax:(336) [phone]   Clinic Follow up Note   Patient Care Team: Wenda Low, MD as PCP - General (Internal Medicine)   I connected with Adrian Banks Comment on 07/26/2019 at  2:30 PM EDT by telephone visit and verified that I am speaking with the correct person using two identifiers.  I discussed the limitations, risks, security and privacy concerns of performing an evaluation and management service by telephone and the availability of in person appointments. I also discussed with the patient that there may be a patient responsible charge related to this service. The patient expressed understanding and agreed to proceed.   Other persons participating in the visit and their role in the encounter:  His wife   Patient's location:  Home Provider's location:  My office   CHIEF COMPLAINT: F/u on metastatic rectal cancer  SUMMARY OF ONCOLOGIC HISTORY: Oncology History Overview Note  Cancer Staging Rectal cancer [facility]) Staging form: Colon and Rectum, AJCC 8th Edition - Clinical stage from 09/29/2017: Stage IVA (cTX, cNX, cM1a) - Signed by Truitt Merle, MD on 03/21/2018    Rectal cancer ([place])  09/29/2017 Procedure   Colonoscopy by Dr. Penelope Coop 09/29/17  Findings:  The perianal and digital rectal examination were normal.  A non-obstructing large mass was found in the proximal rectum and in the mid rectum.  The mass was circumferential.  This was biopsied with a cold forceps for histology.  Area was tattooed with an injection of [place] ink both distal and proximal to the mass.  There is about 4-5 cm of rectal mucosa distal to the mass that looks norma. This mass is about 7-8 cm in length.  A 10 mm polyp was found in the ascending colon. The polyp was sessile. the polyp was removed with a hot snare.  Resection and retrieval were complete.     09/29/2017 Initial Biopsy   FINAL DIAGNOSIS 09/29/17  1. LG Intestine - Ascending Colon Polyp:   TUBULAR ADENOMA 2. LG Intestine - Rectum, Bx: INVASIVE MODERATELY DIFFERENTIATED ADENOCARCINOMA. THE MSI TESTING IS PENDING AND THE RESULT WILL BE REPORTED AS AN ADDENDUM.   09/29/2017 Cancer Staging   Staging form: Colon and Rectum, AJCC 8th Edition - Clinical stage from 09/29/2017: Stage IVA (cTX, cNX, cM1a) - Signed by Truitt Merle, MD on 03/21/2018   09/29/2017 Miscellaneous   Initial Biopsy MMR Results: MSI Stable     10/21/2017 Initial Diagnosis   Rectal cancer ([place])   10/28/2017 Imaging   CT CAP W Contrast  IMPRESSION: 1. Long segment circumferential wall thickening of the rectosigmoid colon consistent with known adenocarcinoma. No evidence of bowel obstruction or perforation. 2. No definite signs of metastatic disease in the abdomen or pelvis. Soft tissue nodularity in the right pelvis is probably due to an unopacified pelvic vein. 3. Multiple pulmonary nodules, some cavitary. These are not typical of metastatic adenocarcinoma (especially without evidence of abdominal disease). Consider multicentric or metastatic lung cancer. PET-CT may be helpful to assess hypermetabolic activity and guided tissue sampling. 4. Multiple bladder calculi consistent with chronic bladder outlet obstruction from prostatomegaly. Nonobstructing right renal calculus. No hydronephrosis. 5. Aortic Atherosclerosis (ICD10-I70.0). Additional incidental findings including a moderate size hiatal hernia and renal cysts.    11/05/2017 - 12/18/2017 Radiation Therapy   Radiation with Dr. Lisbeth Renshaw With Xeloda   11/09/2017 - 12/18/2017 Chemotherapy   Chemoradiation with Xeloda 870m/m2 q12h, M-F on days of radiation plan to starting 11/09/17   11/11/2017 Imaging   PET  Scan  IMPRESSION:  Wall thickening/ mass involving the upper rectum, corresponding to the patient's known rectal adenocarcinoma. Bilateral pulmonary nodules, partially cavitary in the left upper lobe nodule, suspicious for metastases. Notably,  cavitary metastases are more common with squamous cell carcinoma rather than the patient's known adenocarcinoma.    12/25/2018 Procedure   12/25/2018 Sigmoidoscopy Impression: - Malignant tumor in the proximal rectum, in the mid rectum and in the recto-sigmoid colon. Biopsied.     12/25/2018 Pathology Results   Diagnosis 12/25/18 Rectum, biopsy - ADENOCARCINOMA. Microscopic Comment   12/25/2018 Imaging   12/25/2018 CT AP IMPRESSION: 1. Wall thickening and enhancement of the rectum and distal sigmoid colon, consistent with known rectal carcinoma. 2. New hypoattenuating lesion in the right hepatic lobe, concerning for metastatic disease. 3. Increased size of posterior right lung base mass, now measuring 5.4 x 3.8 cm, previously 1.8 x 2.0 cm. Multiple right lower lobe pulmonary nodules, worsened since 10/28/2017, and consistent with metastatic disease. 4. Multiple large calculi within the urinary bladder.   01/12/2019 Pathology Results   Diagnosis Lung, biopsy - METASTATIC ADENOCARCINOMA WITH NECROSIS, CONSISTENT WITH PATIENT'S CLINICAL HISTORY OF PRIMARY COLORECTAL CARCINOMA.   01/12/2019 Miscellaneous   Foundation One 01/12/19  MSI-stable  KRAS G12V MRAS Wildtype ([place]odones 12, 13, 59, 61, 117, and 146 in exons 2, 3 and 4) Tumor Mutational Burden - 5 Muts/Mb Alk R1192Q APC E940HW*8 APC Splice Site 0881+1S>R ASXL1 R1415* MEN1 MEN1([id number])  SF3B1 K666N TP53 R273C   01/20/2019 Imaging   CT Chest Wo Contrast  IMPRESSION: Multiple bilateral pulmonary metastases, including index lesions as above, progressive from 2018.  Aortic Atherosclerosis (ICD10-I70.0) and Emphysema (ICD10-J43.9).    01/20/2019 -  Chemotherapy   First line FOLFOX  every 2 weeks, starting 01/20/2019. Added Avastin with cycle 4. Switched to every 3 weeks starting 05/05/19.    07/21/2019 Imaging   CT CAP W contrast 07/21/19  IMPRESSION: 1. Most of the pulmonary metastases have mildly increased in size. A few  are stable. 2. Solitary tiny segment 8 right liver metastasis is stable. 3. No new sites of metastatic disease. 4. Stable mild nonspecific annular wall thickening and mucosal hyperenhancement in the rectum. Large colonic stool volume suggests constipation. No significant bowel obstruction. 5. Chronic findings include: Aortic Atherosclerosis (ICD10-I70.0). Coronary atherosclerosis. Large hiatal hernia. Mildly enlarged prostate with chronic bladder and nonobstructing right renal stones.        CURRENT THERAPY:  First lineFOLFOX every 2 weeks, starting 01/20/2019. Added Avastin with cycle 4. Switched to every 3 weeks starting 05/05/19.   INTERVAL HISTORY:  ALWIN LANIGAN is here for a follow up. I called him and spoke with him and his wife.  He is clinically stable, doing well overall.  He has recovered well from his last chemo 2 weeks ago.  He denies any significant pain, abdominal discomfort or other new complaints.  His only concern is his frequent bowel movement (5-6 times a day), he does take Imodium 3 to 4 tablets a day, which helps some.  He denies hematochezia, or melena.  He is able to function well at home.  REVIEW OF SYSTEMS:   Constitutional: Denies fevers, chills or abnormal weight loss, (+) fatigue  Eyes: Denies blurriness of vision Ears, nose, mouth, throat, and face: Denies mucositis or sore throat Respiratory: Denies cough, dyspnea or wheezes Cardiovascular: Denies palpitation, chest discomfort or lower extremity swelling Gastrointestinal:  Denies nausea, heartburn or change in bowel habits Skin: Denies abnormal skin rashes Lymphatics: Denies new lymphadenopathy or  easy bruising Neurological:Denies numbness, tingling or new weaknesses Behavioral/Psych: Mood is stable, no new changes  All other systems were reviewed with the patient and are negative.  MEDICAL HISTORY:  Past Medical History:  Diagnosis Date  . Cancer ([place]) 09/29/2017   Rectal cvancer  . High  cholesterol     SURGICAL HISTORY: Past Surgical History:  Procedure Laterality Date  . BIOPSY  12/25/2018   Procedure: BIOPSY;  Surgeon: Wonda Horner, MD;  Location: [facility] ENDOSCOPY;  Service: Endoscopy;;  . FLEXIBLE SIGMOIDOSCOPY N/A 12/25/2018   Procedure: Beryle Quant;  Surgeon: Wonda Horner, MD;  Location: [facility] ENDOSCOPY;  Service: Endoscopy;  Laterality: N/A;  . IR IMAGING GUIDED PORT INSERTION  01/11/2019    I have reviewed the social history and family history with the patient and they are unchanged from previous note.  ALLERGIES:  has No Known Allergies.  MEDICATIONS:  Current Outpatient Medications  Medication Sig Dispense Refill  . diphenoxylate-atropine (LOMOTIL) 2.5-0.025 MG tablet Take 1-2 tablets by mouth 4 (four) times daily as needed for diarrhea or loose stools. 60 tablet 1  . finasteride (PROSCAR) 5 MG tablet Take 5 mg by mouth daily.     Marland Kitchen lidocaine-prilocaine (EMLA) cream Apply to affected area once 30 g 3  . lovastatin (MEVACOR) 40 MG tablet Take 40 mg by mouth daily.     . naproxen sodium (ALEVE) 220 MG tablet Take 220 mg by mouth daily as needed (pain).    . ondansetron (ZOFRAN) 8 MG tablet Take 1 tablet (8 mg total) by mouth 2 (two) times daily as needed for refractory nausea / vomiting. Start on day 3 after chemotherapy. 30 tablet 1  . prochlorperazine (COMPAZINE) 10 MG tablet Take 1 tablet (10 mg total) by mouth every 6 (six) hours as needed (Nausea or vomiting). 30 tablet 1   No current facility-administered medications for this visit.    Facility-Administered Medications Ordered in Other Visits  Medication Dose Route Frequency Provider Last Rate Last Dose  . heparin lock flush 100 unit/mL  500 Units Intravenous Once Truitt Merle, MD        PHYSICAL EXAMINATION: ECOG PERFORMANCE STATUS: 2 - Symptomatic, <50% confined to bed  No vitals taken today, Exam not performed today   LABORATORY DATA:  I have reviewed the data as listed CBC Latest Ref Rng  & Units 07/14/2019 06/23/2019 06/02/2019  WBC 4.0 - 10.5 K/uL 4.0 3.9(L) 5.8  Hemoglobin 13.0 - 17.0 g/dL 13.3 13.3 13.7  Hematocrit 39.0 - 52.0 % 40.5 40.6 41.8  Platelets 150 - 400 K/uL 165 147(L) 132(L)     CMP Latest Ref Rng & Units 07/14/2019 06/23/2019 06/02/2019  Glucose 70 - 99 mg/dL 98 102(H) 93  BUN 8 - 23 mg/dL _0 Creatinine 0.61 - 1.24 mg/dL 1.07 0.95 1.19  Sodium 135 - 145 mmol/L 142 143 143  Potassium 3.5 - 5.1 mmol/L 3.8 3.7 3.7  Chloride 98 - 111 mmol/L 107 108 108  CO2 22 - 32 mmol/L _1 Calcium 8.9 - 10.3 mg/dL 9.4 9.0 9.2  Total Protein 6.5 - 8.1 g/dL 7.0 7.0 6.9  Total Bilirubin 0.3 - 1.2 mg/dL 0.7 0.7 0.6  Alkaline Phos 38 - 126 U/L 77 76 74  AST 15 - 41 U/L _2 ALT 0 - 44 U/L _3 RADIOGRAPHIC STUDIES: I have personally reviewed the radiological images as listed and agreed with the findings in the report.  No results found.   ASSESSMENT & PLAN:  Adrian Banks is a 85 y.o. male with   1. Rectal Cancer, invasive adenocarcinoma,with lung and liver metastasis, cTxNxM1, stage IV, MSS, KRAS mutation(+) -Diagnosed in 09/2017.His initial staging scans showedbilateral lung nodules, highly suspicious for lung metastasis.He declined biopsy of his lung lesions. He was treatedwith radiation andoralchemoXelodato his primary rectal cancer.I last saw him in 03/2018 and he lost f/u since then.His 12/25/18 rectal biopsy and2/5/20 Lung biopsy has confirmedrectal caner recurrence with lungmetastasis. -He started FOLFOX every 2 weeks on 01/20/19.Due to his advanced age, I reduced chemo dose. He tolerated well.I added biological agent Avastin starting with cycle 4. -His FO results shows MSI-stable.KRAS mutation.He is not eligible for immunotherapy or EGFR inhibitor. -Ipreviouslydiscussed he will continue chemo for as long as he can tolerate and it controls his disease. -Due to poor tolerance to chemo lately, I have reduced FOLFOX dose  changed to every 3 weeks starting 05/05/19. May switch to Xeloda and Avastinmaintenance therapy soon.  -I personally reviewed his CT CAP images from 07/21/19 and discussed with pt and his wife which shows overall stable disease in his lungs and liver, some lesions slightly increased in size, but overall stable.  -Pt requests a chemo break, I agree. His wife is more interested in more treatment, which I recommend xeloda and Avastin as next line therapy due to overall poor tolerance and advanced age -will see him back in 4-6 weeks    2. Anemia,secondary to rectal bleeding andiron deficiency -new sinceearly 2019  -probably related to his rectal cancer bleeding -will check iron studyperiodically. -S/p2doses of IV iron recently,he responded well.  -Anemia resolved lately   3. Diarrhea, Rectal Bleeding  -Secondary to rectal cancer recurrence  -Hematochezia has resolved  -Diarrheamildly improving with Imodium, he also has lomotil.He has bowel movementsabout 4-5 times a day, no loose stool.  4.Goal of care discussion -We again discussed the incurable nature ofhiscancer, and the overall poor prognosis, especially if he does not have good response to chemotherapy or progresses. -The patient understands the goal of care is palliative. -I recommend DNR/DNI,he is agreeable with DNR/DNI.   5. Dementia -Pt has appeared to havesome degree of cognitive dysfunction since a saw him the first time, this is much worsenow, I think he needs further evaluation  -Ipreviouslydiscussed withhis wife. She states pt does not talk much at home, he functions well, she have not noticed any change of his memory orbehavioral issues. -Will also call his daughter and inform her, and I will encourage his family to bring him to see his PCP for dementia evaluation -Pt feels more forgetful with chemo brain lately  6. Neuropathy, Imbalance  -He has been dropping things due to numbness in  fingers and has right leg weakness -I have reduced chemo dose and plan to switch to maintenance therapy soon. -overall stable lately    Plan -CT scan reviewed, overall stable disease -will start chemo break -f/u in 4-6 weeks with lab    No problem-specific Assessment & Plan notes found for this encounter.   No orders of the defined types were placed in this encounter.  I discussed the assessment and treatment plan with the patient. The patient was provided an opportunity to ask questions and all were answered. The patient agreed with the plan and demonstrated an understanding of the instructions.  The patient was advised to call back or seek an in-person evaluation if the symptoms worsen or if the condition fails to improve as  anticipated.  I provided 22 minutes of non face-to-face telephone visit time during this encounter, and > 50% was spent counseling as documented under my assessment & plan.    Truitt Merle, MD 07/26/2019   I, Joslyn Devon, am acting as scribe for Truitt Merle, MD.   I have reviewed the above documentation for accuracy and completeness, and I agree with the above.

## 2019-07-26 ENCOUNTER — Encounter: Payer: Self-pay | Admitting: Hematology

## 2019-07-26 ENCOUNTER — Inpatient Hospital Stay (HOSPITAL_BASED_OUTPATIENT_CLINIC_OR_DEPARTMENT_OTHER): Payer: Medicare Other | Admitting: Hematology

## 2019-07-26 DIAGNOSIS — C20 Malignant neoplasm of rectum: Secondary | ICD-10-CM

## 2019-07-26 DIAGNOSIS — R197 Diarrhea, unspecified: Secondary | ICD-10-CM

## 2019-07-27 ENCOUNTER — Telehealth: Payer: Self-pay | Admitting: Hematology

## 2019-07-27 NOTE — Telephone Encounter (Signed)
Scheduled appt per 8/18 los.  Spoke with patient and they are aware of the appt date and time

## 2019-07-28 ENCOUNTER — Ambulatory Visit: Payer: Medicare Other

## 2019-07-28 ENCOUNTER — Other Ambulatory Visit: Payer: Medicare Other

## 2019-07-28 ENCOUNTER — Ambulatory Visit: Payer: Medicare Other | Admitting: Hematology

## 2019-08-29 NOTE — Progress Notes (Signed)
[place]   [phone] [phone]   Clinic Follow up Note   Patient Care Team: Wenda Low, MD as PCP - General (Internal Medicine)  Date of Service:  09/01/2019  CHIEF COMPLAINT: F/u on metastatic rectal cancer  SUMMARY OF ONCOLOGIC HISTORY: Oncology History Overview Note  Cancer Staging Rectal cancer [facility]) Staging form: Colon and Rectum, AJCC 8th Edition - Clinical stage from 09/29/2017: Stage IVA (cTX, cNX, cM1a) - Signed by Truitt Merle, MD on 03/21/2018    Rectal cancer ([place])  09/29/2017 Procedure   Colonoscopy by Dr. Penelope Coop 09/29/17  Findings:  The perianal and digital rectal examination were normal.  A non-obstructing large mass was found in the proximal rectum and in the mid rectum.  The mass was circumferential.  This was biopsied with a cold forceps for histology.  Area was tattooed with an injection of [place] ink both distal and proximal to the mass.  There is about 4-5 cm of rectal mucosa distal to the mass that looks norma. This mass is about 7-8 cm in length.  A 10 mm polyp was found in the ascending colon. The polyp was sessile. the polyp was removed with a hot snare.  Resection and retrieval were complete.     09/29/2017 Initial Biopsy   FINAL DIAGNOSIS 09/29/17  1. LG Intestine - Ascending Colon Polyp:  TUBULAR ADENOMA 2. LG Intestine - Rectum, Bx: INVASIVE MODERATELY DIFFERENTIATED ADENOCARCINOMA. THE MSI TESTING IS PENDING AND THE RESULT WILL BE REPORTED AS AN ADDENDUM.   09/29/2017 Cancer Staging   Staging form: Colon and Rectum, AJCC 8th Edition - Clinical stage from 09/29/2017: Stage IVA (cTX, cNX, cM1a) - Signed by Truitt Merle, MD on 03/21/2018   09/29/2017 Miscellaneous   Initial Biopsy MMR Results: MSI Stable     10/21/2017 Initial Diagnosis   Rectal cancer ([place])   10/28/2017 Imaging   CT CAP W Contrast  IMPRESSION: 1. Long segment circumferential wall thickening of the rectosigmoid colon consistent with  known adenocarcinoma. No evidence of bowel obstruction or perforation. 2. No definite signs of metastatic disease in the abdomen or pelvis. Soft tissue nodularity in the right pelvis is probably due to an unopacified pelvic vein. 3. Multiple pulmonary nodules, some cavitary. These are not typical of metastatic adenocarcinoma (especially without evidence of abdominal disease). Consider multicentric or metastatic lung cancer. PET-CT may be helpful to assess hypermetabolic activity and guided tissue sampling. 4. Multiple bladder calculi consistent with chronic bladder outlet obstruction from prostatomegaly. Nonobstructing right renal calculus. No hydronephrosis. 5. Aortic Atherosclerosis (ICD10-I70.0). Additional incidental findings including a moderate size hiatal hernia and renal cysts.    11/05/2017 - 12/18/2017 Radiation Therapy   Radiation with Dr. Lisbeth Renshaw With Xeloda   11/09/2017 - 12/18/2017 Chemotherapy   Chemoradiation with Xeloda 844m/m2 q12h, M-F on days of radiation plan to starting 11/09/17   11/11/2017 Imaging   PET Scan  IMPRESSION:  Wall thickening/ mass involving the upper rectum, corresponding to the patient's known rectal adenocarcinoma. Bilateral pulmonary nodules, partially cavitary in the left upper lobe nodule, suspicious for metastases. Notably, cavitary metastases are more common with squamous cell carcinoma rather than the patient's known adenocarcinoma.    12/25/2018 Procedure   12/25/2018 Sigmoidoscopy Impression: - Malignant tumor in the proximal rectum, in the mid rectum and in the recto-sigmoid colon. Biopsied.     12/25/2018 Pathology Results   Diagnosis 12/25/18 Rectum, biopsy - ADENOCARCINOMA. Microscopic Comment   12/25/2018 Imaging   12/25/2018 CT AP IMPRESSION: 1. Wall thickening and  enhancement of the rectum and distal sigmoid colon, consistent with known rectal carcinoma. 2. New hypoattenuating lesion in the right hepatic lobe, concerning for  metastatic disease. 3. Increased size of posterior right lung base mass, now measuring 5.4 x 3.8 cm, previously 1.8 x 2.0 cm. Multiple right lower lobe pulmonary nodules, worsened since 10/28/2017, and consistent with metastatic disease. 4. Multiple large calculi within the urinary bladder.   01/12/2019 Pathology Results   Diagnosis Lung, biopsy - METASTATIC ADENOCARCINOMA WITH NECROSIS, CONSISTENT WITH PATIENT'S CLINICAL HISTORY OF PRIMARY COLORECTAL CARCINOMA.   01/12/2019 Miscellaneous   Foundation One 01/12/19  MSI-stable  KRAS G12V MRAS Wildtype (Codones 12, 13, 59, 61, 117, and 146 in exons 2, 3 and 4) Tumor Mutational Burden - 5 Muts/Mb Alk R1192Q APC V494WH*6 APC Splice Site 7591+6B>W ASXL1 R1415* MEN1 MEN1([id number])  SF3B1 K666N TP53 R273C   01/20/2019 Imaging   CT Chest Wo Contrast  IMPRESSION: Multiple bilateral pulmonary metastases, including index lesions as above, progressive from 2018.  Aortic Atherosclerosis (ICD10-I70.0) and Emphysema (ICD10-J43.9).    01/20/2019 - 07/14/2019 Chemotherapy   First line FOLFOX  every 2 weeks, starting 01/20/2019. Added Avastin with cycle 4. Switched to every 3 weeks starting 05/05/19. Started chemo break on 07/26/19. Last chemo on 07/14/19.    07/21/2019 Imaging   CT CAP W contrast 07/21/19  IMPRESSION: 1. Most of the pulmonary metastases have mildly increased in size. A few are stable. 2. Solitary tiny segment 8 right liver metastasis is stable. 3. No new sites of metastatic disease. 4. Stable mild nonspecific annular wall thickening and mucosal hyperenhancement in the rectum. Large colonic stool volume suggests constipation. No significant bowel obstruction. 5. Chronic findings include: Aortic Atherosclerosis (ICD10-I70.0). Coronary atherosclerosis. Large hiatal hernia. Mildly enlarged prostate with chronic bladder and nonobstructing right renal stones.        CURRENT THERAPY:  Chemo break, observation   INTERVAL  HISTORY:  Adrian Banks is here for a follow up and treatment. He presents to the clinic alone. He notes he is doing better with chemo break. He has been able to gain some weight. He denies SOB or pain. He has been able to do laundry and other small household tasks on 2 levels of his house. He notes his BM are stable. He denies any GI bleeding or abdominal pain. I reviewed his medication list with him. He is taking HTN meds. He notes numbness of his fingertips have gotten better lately.    REVIEW OF SYSTEMS:   Constitutional: Denies fevers, chills or abnormal weight loss Eyes: Denies blurriness of vision Ears, nose, mouth, throat, and face: Denies mucositis or sore throat Respiratory: Denies cough, dyspnea or wheezes Cardiovascular: Denies palpitation, chest discomfort or lower extremity swelling Gastrointestinal:  Denies nausea, heartburn or change in bowel habits Skin: Denies abnormal skin rashes Lymphatics: Denies new lymphadenopathy or easy bruising Neurological: (+) Numbness of his fingertips, improved.  Behavioral/Psych: Mood is stable, no new changes  All other systems were reviewed with the patient and are negative.  MEDICAL HISTORY:  Past Medical History:  Diagnosis Date  . Cancer ([place]) 09/29/2017   Rectal cvancer  . High cholesterol     SURGICAL HISTORY: Past Surgical History:  Procedure Laterality Date  . BIOPSY  12/25/2018   Procedure: BIOPSY;  Surgeon: Wonda Horner, MD;  Location: [facility] ENDOSCOPY;  Service: Endoscopy;;  . FLEXIBLE SIGMOIDOSCOPY N/A 12/25/2018   Procedure: Beryle Quant;  Surgeon: Wonda Horner, MD;  Location: [facility] ENDOSCOPY;  Service: Endoscopy;  Laterality:  N/A;  . IR IMAGING GUIDED PORT INSERTION  01/11/2019    I have reviewed the social history and family history with the patient and they are unchanged from previous note.  ALLERGIES:  has No Known Allergies.  MEDICATIONS:  Current Outpatient Medications  Medication Sig Dispense Refill  .  diphenoxylate-atropine (LOMOTIL) 2.5-0.025 MG tablet Take 1-2 tablets by mouth 4 (four) times daily as needed for diarrhea or loose stools. 60 tablet 1  . finasteride (PROSCAR) 5 MG tablet Take 5 mg by mouth daily.     Marland Kitchen lidocaine-prilocaine (EMLA) cream Apply to affected area once 30 g 3  . lovastatin (MEVACOR) 40 MG tablet Take 40 mg by mouth daily.     . naproxen sodium (ALEVE) 220 MG tablet Take 220 mg by mouth daily as needed (pain).    . ondansetron (ZOFRAN) 8 MG tablet Take 1 tablet (8 mg total) by mouth 2 (two) times daily as needed for refractory nausea / vomiting. Start on day 3 after chemotherapy. 30 tablet 1  . prochlorperazine (COMPAZINE) 10 MG tablet Take 1 tablet (10 mg total) by mouth every 6 (six) hours as needed (Nausea or vomiting). 30 tablet 1   No current facility-administered medications for this visit.    Facility-Administered Medications Ordered in Other Visits  Medication Dose Route Frequency Provider Last Rate Last Dose  . heparin lock flush 100 unit/mL  500 Units Intravenous Once Truitt Merle, MD        PHYSICAL EXAMINATION: ECOG PERFORMANCE STATUS: 2 - Symptomatic, <50% confined to bed  Vitals:   09/01/19 1045  BP: (!) 179/90  Pulse: 96  Resp: 18  Temp: 98.5 F (36.9 C)  SpO2: 98%   Filed Weights   09/01/19 1045  Weight: 154 lb (69.9 kg)    GENERAL:alert, no distress and comfortable SKIN: skin color, texture, turgor are normal, no rashes or significant lesions EYES: normal, Conjunctiva are pink and non-injected, sclera clear  NECK: supple, thyroid normal size, non-tender, without nodularity LYMPH:  no palpable lymphadenopathy in the cervical, axillary  LUNGS: clear to auscultation and percussion with normal breathing effort HEART: regular rate & rhythm and no murmurs and no lower extremity edema ABDOMEN:abdomen soft, non-tender and normal bowel sounds Musculoskeletal:no cyanosis of digits and no clubbing  NEURO: alert & oriented x 3 with fluent  speech, no focal motor/sensory deficits  LABORATORY DATA:  I have reviewed the data as listed CBC Latest Ref Rng & Units 09/01/2019 07/14/2019 06/23/2019  WBC 4.0 - 10.5 K/uL 5.4 4.0 3.9(L)  Hemoglobin 13.0 - 17.0 g/dL 13.4 13.3 13.3  Hematocrit 39.0 - 52.0 % 41.4 40.5 40.6  Platelets 150 - 400 K/uL PLATELET CLUMPS NOTED ON SMEAR, COUNT APPEARS DECREASED 165 147(L)     CMP Latest Ref Rng & Units 09/01/2019 07/14/2019 06/23/2019  Glucose 70 - 99 mg/dL 102(H) 98 102(H)  BUN 8 - 23 mg/dL _0 Creatinine 0.61 - 1.24 mg/dL 0.97 1.07 0.95  Sodium 135 - 145 mmol/L 142 142 143  Potassium 3.5 - 5.1 mmol/L 3.7 3.8 3.7  Chloride 98 - 111 mmol/L 109 107 108  CO2 22 - 32 mmol/L _1 Calcium 8.9 - 10.3 mg/dL 8.9 9.4 9.0  Total Protein 6.5 - 8.1 g/dL 6.9 7.0 7.0  Total Bilirubin 0.3 - 1.2 mg/dL 0.5 0.7 0.7  Alkaline Phos 38 - 126 U/L 78 77 76  AST 15 - 41 U/L _2 ALT 0 - 44 U/L 11 19 14  RADIOGRAPHIC STUDIES: I have personally reviewed the radiological images as listed and agreed with the findings in the report. No results found.   ASSESSMENT & PLAN:  Adrian Banks is a 85 y.o. male with   1. Rectal Cancer, invasive adenocarcinoma,with lung and liver metastasis, cTxNxM1, stage IV, MSS, KRAS mutation(+) -Diagnosed in 09/2017.His initial staging scans showedbilateral lung nodules, highly suspicious for lung metastasis.He declined biopsy of his lung lesions. He was treatedwith radiation andoralchemoXelodato his primary rectal cancer.I last saw him in 03/2018 and he lost f/u since then.His 12/25/18 rectal biopsy and2/5/20 Lung biopsy has confirmedrectal caner recurrence with lungmetastasis. -He started FOLFOX every 2 weeks on 01/20/19.Due to his advanced age, I reduced chemo dose. He tolerated well.I added biological agent Avastin starting with cycle 4. -His FO results shows MSI-stable.KRAS mutation.He is not eligible for immunotherapy or EGFR inhibitor. -He  received first line chemo FOLFOX and avastin with dose reductionfrom 01/2019 tp 07/2019, and had partial response. He tolerated moderately well  -CT CAP from 07/21/19 shows overall stable disease in his lungs and liver, some lesions slightly increased in size, but overall stable.  -Given his fatigue he opted to start chemo break started on 07/26/19.  -He is doing well on current chemo break. He has been able to gain weight and his mild neuropathy is improving. He remains active at home. He has no pain or GI issues. Labs reviewed, CBC and CMP WNL BG 102, albumin 3,4. CEA and iron panel is still pending.  -We again discussed the incurable nature of his cancer, and management options, and the timing to restart chemotherapy.  Patient again voiced his preference for quality of life, and does not want to have more chemo unless he is symptomatic.  Given his advanced age, this is a very reasonable.   -F/u in 2 months with port flush. He can contact me sooner if he is experiencing pain, fever, SOB or GI issues.  -He opted to receive flu shot today   2. Anemia,secondary to rectal bleeding andiron deficiency -new sinceearly 2019  -probably related to his rectal cancer bleeding -will check iron studyperiodically. -S/p2doses of IV iron recently,he responded well.  -Anemia resolved lately   3. Diarrhea, Rectal Bleeding  -Secondary to rectal cancer recurrence  -Hematochezia has resolved  -Diarrheamildly improving with Imodium, he also has lomotil.He has bowel movementsabout4-5times a day, no loose stool.  4.Goal of care discussion -We previously discussed the incurable nature ofhiscancer, and the overall poor prognosis, especially if he does not have good response to chemotherapy or progresses. -The patient understands the goal of care is palliative. -I recommend DNR/DNI,he is agreeable with DNR/DNI.   5. Dementia -Pt has appeared to havesome degree of cognitive dysfunction  since a saw him the first time, this is much worsenow, I think he needs further evaluation  -Ipreviouslydiscussed withhis wife. She states pt does not talk much at home, he functions well, she have not noticed any change of his memory orbehavioral issues. -Will also call his daughter and inform her, and I will encourage his family to bring him to see his PCP for dementia evaluation -Pt feels more forgetful with chemo brain lately   6. Neuropathy, Imbalance  -He has been dropping things due to numbness in fingers and has right leg weakness -I havereducedchemo dose and plan to switch to maintenance therapy soon. -Neuropathy of his fingertips has improved. Mild and manageable.   7. Elevated BP  -BP at 179/90 today (09/01/19) -I encouraged him to monitor  BP at home and if persistently high he should see his PCP.    Plan -Flu shot today  -Will continue chemo break  -Lab, flush and f/u in 2 months  -I offered to cal his wife, he declined    No problem-specific Assessment & Plan notes found for this encounter.   No orders of the defined types were placed in this encounter.  All questions were answered. The patient knows to call the clinic with any problems, questions or concerns. No barriers to learning was detected. I spent 15 minutes counseling the patient face to face. The total time spent in the appointment was 20 minutes and more than 50% was on counseling and review of test results     Truitt Merle, MD 09/01/2019   I, Joslyn Devon, am acting as scribe for Truitt Merle, MD.   I have reviewed the above documentation for accuracy and completeness, and I agree with the above.

## 2019-08-31 ENCOUNTER — Telehealth: Payer: Self-pay

## 2019-08-31 NOTE — Telephone Encounter (Signed)
Patient left a voicemail asking if he could drive himself to his appointment tomorrow since "thery're taking my port out". Looked through patient's chart and did not see an appointment with IR for PAC removal. Reviewed Dr. Ernestina Penna last office note which only mentioned the patient taking a break from chemotherapy, and nothing about having his PAC taken out.   Called patient back to inform him that his appointments tomorrow are for lab/flush/MD, and not to have his PAC removed. Informed him that since he's not receiving treatment, if he would like to drive himself to his appointments that would be appropriate. Reminded the patient to arrive at 09:00 so he would have time to check in at 09:15 for his lab appointment. Patient verbalized understanding and agreement, and denied any further needs at this time.

## 2019-09-01 ENCOUNTER — Other Ambulatory Visit: Payer: Self-pay

## 2019-09-01 ENCOUNTER — Inpatient Hospital Stay: Payer: Medicare Other | Attending: Hematology

## 2019-09-01 ENCOUNTER — Encounter: Payer: Self-pay | Admitting: Hematology

## 2019-09-01 ENCOUNTER — Telehealth: Payer: Self-pay | Admitting: Hematology

## 2019-09-01 ENCOUNTER — Inpatient Hospital Stay: Payer: Medicare Other

## 2019-09-01 ENCOUNTER — Inpatient Hospital Stay: Payer: Medicare Other | Admitting: Hematology

## 2019-09-01 VITALS — BP 179/90 | HR 96 | Temp 98.5°F | Resp 18 | Ht 70.0 in | Wt 154.0 lb

## 2019-09-01 DIAGNOSIS — Z23 Encounter for immunization: Secondary | ICD-10-CM

## 2019-09-01 DIAGNOSIS — I10 Essential (primary) hypertension: Secondary | ICD-10-CM | POA: Diagnosis not present

## 2019-09-01 DIAGNOSIS — Z79899 Other long term (current) drug therapy: Secondary | ICD-10-CM | POA: Diagnosis not present

## 2019-09-01 DIAGNOSIS — C20 Malignant neoplasm of rectum: Secondary | ICD-10-CM | POA: Insufficient documentation

## 2019-09-01 DIAGNOSIS — C787 Secondary malignant neoplasm of liver and intrahepatic bile duct: Secondary | ICD-10-CM | POA: Insufficient documentation

## 2019-09-01 DIAGNOSIS — C7802 Secondary malignant neoplasm of left lung: Secondary | ICD-10-CM | POA: Insufficient documentation

## 2019-09-01 DIAGNOSIS — F0390 Unspecified dementia without behavioral disturbance: Secondary | ICD-10-CM | POA: Diagnosis not present

## 2019-09-01 DIAGNOSIS — G629 Polyneuropathy, unspecified: Secondary | ICD-10-CM | POA: Diagnosis not present

## 2019-09-01 DIAGNOSIS — Z95828 Presence of other vascular implants and grafts: Secondary | ICD-10-CM

## 2019-09-01 DIAGNOSIS — R197 Diarrhea, unspecified: Secondary | ICD-10-CM | POA: Insufficient documentation

## 2019-09-01 DIAGNOSIS — D500 Iron deficiency anemia secondary to blood loss (chronic): Secondary | ICD-10-CM

## 2019-09-01 LAB — CBC WITH DIFFERENTIAL/PLATELET
Abs Immature Granulocytes: 0.01 10*3/uL (ref 0.00–0.07)
Basophils Absolute: 0.1 10*3/uL (ref 0.0–0.1)
Basophils Relative: 2 %
Eosinophils Absolute: 0.2 10*3/uL (ref 0.0–0.5)
Eosinophils Relative: 4 %
HCT: 41.4 % (ref 39.0–52.0)
Hemoglobin: 13.4 g/dL (ref 13.0–17.0)
Immature Granulocytes: 0 %
Lymphocytes Relative: 13 %
Lymphs Abs: 0.7 10*3/uL (ref 0.7–4.0)
MCH: 29.9 pg (ref 26.0–34.0)
MCHC: 32.4 g/dL (ref 30.0–36.0)
MCV: 92.4 fL (ref 80.0–100.0)
Monocytes Absolute: 0.6 10*3/uL (ref 0.1–1.0)
Monocytes Relative: 12 %
Neutro Abs: 3.8 10*3/uL (ref 1.7–7.7)
Neutrophils Relative %: 69 %
Platelets: DECREASED 10*3/uL (ref 150–400)
RBC: 4.48 MIL/uL (ref 4.22–5.81)
RDW: 14.3 % (ref 11.5–15.5)
WBC: 5.4 10*3/uL (ref 4.0–10.5)
nRBC: 0 % (ref 0.0–0.2)

## 2019-09-01 LAB — CEA (IN HOUSE-CHCC): CEA (CHCC-In House): 20.06 ng/mL — ABNORMAL HIGH (ref 0.00–5.00)

## 2019-09-01 LAB — COMPREHENSIVE METABOLIC PANEL
ALT: 11 U/L (ref 0–44)
AST: 18 U/L (ref 15–41)
Albumin: 3.4 g/dL — ABNORMAL LOW (ref 3.5–5.0)
Alkaline Phosphatase: 78 U/L (ref 38–126)
Anion gap: 8 (ref 5–15)
BUN: 20 mg/dL (ref 8–23)
CO2: 25 mmol/L (ref 22–32)
Calcium: 8.9 mg/dL (ref 8.9–10.3)
Chloride: 109 mmol/L (ref 98–111)
Creatinine, Ser: 0.97 mg/dL (ref 0.61–1.24)
GFR calc Af Amer: >60 mL/min (ref >60)
GFR calc non Af Amer: >60 mL/min (ref >60)
Glucose, Bld: 102 mg/dL — ABNORMAL HIGH (ref 70–99)
Potassium: 3.7 mmol/L (ref 3.5–5.1)
Sodium: 142 mmol/L (ref 135–145)
Total Bilirubin: 0.5 mg/dL (ref 0.3–1.2)
Total Protein: 6.9 g/dL (ref 6.5–8.1)

## 2019-09-01 LAB — IRON AND TIBC
Iron: 71 ug/dL (ref 42–163)
Saturation Ratios: 22 % (ref 20–55)
TIBC: 328 ug/dL (ref 202–409)
UIBC: 257 ug/dL (ref 117–376)

## 2019-09-01 LAB — FERRITIN: Ferritin: 43 ng/mL (ref 24–336)

## 2019-09-01 MED ORDER — HEPARIN SOD (PORK) LOCK FLUSH 100 UNIT/ML IV SOLN
500.0000 [IU] | Freq: Once | INTRAVENOUS | Status: AC
  Administered 2019-09-01: 500 [IU] via INTRAVENOUS
  Filled 2019-09-01: qty 5

## 2019-09-01 MED ORDER — SODIUM CHLORIDE 0.9% FLUSH
10.0000 mL | INTRAVENOUS | Status: DC | PRN
  Administered 2019-09-01: 10:00:00 10 mL via INTRAVENOUS
  Filled 2019-09-01: qty 10

## 2019-09-01 MED ORDER — INFLUENZA VAC A&B SA ADJ QUAD 0.5 ML IM PRSY
0.5000 mL | PREFILLED_SYRINGE | Freq: Once | INTRAMUSCULAR | Status: AC
  Administered 2019-09-01: 0.5 mL via INTRAMUSCULAR

## 2019-09-01 MED ORDER — INFLUENZA VAC SPLIT QUAD 0.5 ML IM SUSY: 0.5000 mL | PREFILLED_SYRINGE | Freq: Once | INTRAMUSCULAR | Status: DC

## 2019-09-01 NOTE — Telephone Encounter (Signed)
Scheduled appt per 9/24 los.  Spoke with and per his request mail an appt calendar.  Printed and mailed an appt calendar

## 2019-10-31 ENCOUNTER — Telehealth: Payer: Self-pay | Admitting: Hematology

## 2019-10-31 NOTE — Telephone Encounter (Signed)
Contacted patient to verify telephone visit for pre reg °

## 2019-10-31 NOTE — Telephone Encounter (Signed)
Changed appt on 11/24 to phone call per 11/22 sch message - pt aware not to come in '

## 2019-10-31 NOTE — Progress Notes (Signed)
[place]   [phone] [phone]   Clinic Follow up Note   Patient Care Team: Wenda Low, MD as PCP - General (Internal Medicine)   I connected with Adrian Banks on 11/01/2019 at 10:00 AM EST by telephone visit and verified that I am speaking with the correct person using two identifiers.  I discussed the limitations, risks, security and privacy concerns of performing an evaluation and management service by telephone and the availability of in person appointments. I also discussed with the patient that there may be a patient responsible charge related to this service. The patient expressed understanding and agreed to proceed.   Other persons participating in the visit and their role in the encounter:  His wife  Patient's location:  His home  Provider's location:  My Office  CHIEF COMPLAINT: F/u of Metastatic Rectal Cancer   SUMMARY OF ONCOLOGIC HISTORY: Oncology History Overview Note  Cancer Staging Rectal cancer [facility]) Staging form: Colon and Rectum, AJCC 8th Edition - Clinical stage from 09/29/2017: Stage IVA (cTX, cNX, cM1a) - Signed by Truitt Merle, MD on 03/21/2018    Rectal cancer ([place])  09/29/2017 Procedure   Colonoscopy by Dr. Penelope Coop 09/29/17  Findings:  The perianal and digital rectal examination were normal.  A non-obstructing large mass was found in the proximal rectum and in the mid rectum.  The mass was circumferential.  This was biopsied with a cold forceps for histology.  Area was tattooed with an injection of [place] ink both distal and proximal to the mass.  There is about 4-5 cm of rectal mucosa distal to the mass that looks norma. This mass is about 7-8 cm in length.  A 10 mm polyp was found in the ascending colon. The polyp was sessile. the polyp was removed with a hot snare.  Resection and retrieval were complete.     09/29/2017 Initial Biopsy   FINAL DIAGNOSIS 09/29/17  1. LG Intestine - Ascending Colon Polyp:   TUBULAR ADENOMA 2. LG Intestine - Rectum, Bx: INVASIVE MODERATELY DIFFERENTIATED ADENOCARCINOMA. THE MSI TESTING IS PENDING AND THE RESULT WILL BE REPORTED AS AN ADDENDUM.   09/29/2017 Cancer Staging   Staging form: Colon and Rectum, AJCC 8th Edition - Clinical stage from 09/29/2017: Stage IVA (cTX, cNX, cM1a) - Signed by Truitt Merle, MD on 03/21/2018   09/29/2017 Miscellaneous   Initial Biopsy MMR Results: MSI Stable     10/21/2017 Initial Diagnosis   Rectal cancer ([place])   10/28/2017 Imaging   CT CAP W Contrast  IMPRESSION: 1. Long segment circumferential wall thickening of the rectosigmoid colon consistent with known adenocarcinoma. No evidence of bowel obstruction or perforation. 2. No definite signs of metastatic disease in the abdomen or pelvis. Soft tissue nodularity in the right pelvis is probably due to an unopacified pelvic vein. 3. Multiple pulmonary nodules, some cavitary. These are not typical of metastatic adenocarcinoma (especially without evidence of abdominal disease). Consider multicentric or metastatic lung cancer. PET-CT may be helpful to assess hypermetabolic activity and guided tissue sampling. 4. Multiple bladder calculi consistent with chronic bladder outlet obstruction from prostatomegaly. Nonobstructing right renal calculus. No hydronephrosis. 5. Aortic Atherosclerosis (ICD10-I70.0). Additional incidental findings including a moderate size hiatal hernia and renal cysts.    11/05/2017 - 12/18/2017 Radiation Therapy   Radiation with Dr. Lisbeth Renshaw With Xeloda   11/09/2017 - 12/18/2017 Chemotherapy   Chemoradiation with Xeloda '825mg'$ /m2 q12h, M-F on days of radiation plan to starting 11/09/17   11/11/2017 Imaging   PET  Scan  IMPRESSION:  Wall thickening/ mass involving the upper rectum, corresponding to the patient's known rectal adenocarcinoma. Bilateral pulmonary nodules, partially cavitary in the left upper lobe nodule, suspicious for metastases. Notably,  cavitary metastases are more common with squamous cell carcinoma rather than the patient's known adenocarcinoma.    12/25/2018 Procedure   12/25/2018 Sigmoidoscopy Impression: - Malignant tumor in the proximal rectum, in the mid rectum and in the recto-sigmoid colon. Biopsied.     12/25/2018 Pathology Results   Diagnosis 12/25/18 Rectum, biopsy - ADENOCARCINOMA. Microscopic Banks   12/25/2018 Imaging   12/25/2018 CT AP IMPRESSION: 1. Wall thickening and enhancement of the rectum and distal sigmoid colon, consistent with known rectal carcinoma. 2. New hypoattenuating lesion in the right hepatic lobe, concerning for metastatic disease. 3. Increased size of posterior right lung base mass, now measuring 5.4 x 3.8 cm, previously 1.8 x 2.0 cm. Multiple right lower lobe pulmonary nodules, worsened since 10/28/2017, and consistent with metastatic disease. 4. Multiple large calculi within the urinary bladder.   01/12/2019 Pathology Results   Diagnosis Lung, biopsy - METASTATIC ADENOCARCINOMA WITH NECROSIS, CONSISTENT WITH PATIENT'S CLINICAL HISTORY OF PRIMARY COLORECTAL CARCINOMA.   01/12/2019 Miscellaneous   Foundation One 01/12/19  MSI-stable  KRAS G12V MRAS Wildtype ([place]od[place] 12, 13, 59, 61, 117, and 146 in exons 2, 3 and 4) Tumor Mutational Burden - 5 Muts/Mb Alk R1192Q APC Z610RU*0 APC Splice Site 4540+9W>J ASXL1 R1415* MEN1 MEN1([id number])  SF3B1 K666N TP53 R273C   01/20/2019 Imaging   CT Chest Wo Contrast  IMPRESSION: Multiple bilateral pulmonary metastases, including index lesions as above, progressive from 2018.  Aortic Atherosclerosis (ICD10-I70.0) and Emphysema (ICD10-J43.9).    01/20/2019 - 07/14/2019 Chemotherapy   First line FOLFOX  every 2 weeks, starting 01/20/2019. Added Avastin with cycle 4. Switched to every 3 weeks starting 05/05/19. Started chemo break on 07/26/19. Last chemo on 07/14/19.    07/21/2019 Imaging   CT CAP W contrast 07/21/19  IMPRESSION: 1. Most of  the pulmonary metastases have mildly increased in size. A few are stable. 2. Solitary tiny segment 8 right liver metastasis is stable. 3. No new sites of metastatic disease. 4. Stable mild nonspecific annular wall thickening and mucosal hyperenhancement in the rectum. Large colonic stool volume suggests constipation. No significant bowel obstruction. 5. Chronic findings include: Aortic Atherosclerosis (ICD10-I70.0). Coronary atherosclerosis. Large hiatal hernia. Mildly enlarged prostate with chronic bladder and nonobstructing right renal stones.        CURRENT THERAPY:  Chemo break, Observation  INTERVAL HISTORY:  Adrian Banks is here for a follow up of metastatic rectal cancer.   He feels his fingers and toes are paralyzed from numbness. He can still function but does not have enough grip. He still drops things. He notes his balance is effected and he fell yesterday after tripped on the steps when he was bringing objects into his house. He was not significantly injured from this. He denies any pain. He notes his appetite is fair. He has not weighed himself lately but his clothes still fit adequately. He denies any chest or breathing issues. He notes still having 3-4 BMs daily. He has tried imodium without help, but finds relief with Pepto-bismol. He denies blood in stool.    REVIEW OF SYSTEMS:   Constitutional: Denies fevers, chills or abnormal weight loss Eyes: Denies blurriness of vision Ears, nose, mouth, throat, and face: Denies mucositis or sore throat Respiratory: Denies cough, dyspnea or wheezes Cardiovascular: Denies palpitation, chest discomfort or lower extremity swelling  Gastrointestinal:  Denies nausea, heartburn or change in bowel habits (+) diarrhea 3-4 times a day Skin: Denies abnormal skin rashes Lymphatics: Denies new lymphadenopathy or easy bruising Neurological: (+) numbness of tips of fingers and toes (+) unstable balance, fall Behavioral/Psych: Mood is  stable, no new changes  All other systems were reviewed with the patient and are negative.  MEDICAL HISTORY:  Past Medical History:  Diagnosis Date  . Cancer ([place]) 09/29/2017   Rectal cvancer  . High cholesterol     SURGICAL HISTORY: Past Surgical History:  Procedure Laterality Date  . BIOPSY  12/25/2018   Procedure: BIOPSY;  Surgeon: Wonda Horner, MD;  Location: [facility] ENDOSCOPY;  Service: Endoscopy;;  . FLEXIBLE SIGMOIDOSCOPY N/A 12/25/2018   Procedure: Beryle Quant;  Surgeon: Wonda Horner, MD;  Location: [facility] ENDOSCOPY;  Service: Endoscopy;  Laterality: N/A;  . IR IMAGING GUIDED PORT INSERTION  01/11/2019    I have reviewed the social history and family history with the patient and they are unchanged from previous note.  ALLERGIES:  has No Known Allergies.  MEDICATIONS:  Current Outpatient Medications  Medication Sig Dispense Refill  . diphenoxylate-atropine (LOMOTIL) 2.5-0.025 MG tablet Take 1-2 tablets by mouth 4 (four) times daily as needed for diarrhea or loose stools. 60 tablet 1  . finasteride (PROSCAR) 5 MG tablet Take 5 mg by mouth daily.     Marland Kitchen lidocaine-prilocaine (EMLA) cream Apply to affected area once 30 g 3  . lovastatin (MEVACOR) 40 MG tablet Take 40 mg by mouth daily.     . naproxen sodium (ALEVE) 220 MG tablet Take 220 mg by mouth daily as needed (pain).    . ondansetron (ZOFRAN) 8 MG tablet Take 1 tablet (8 mg total) by mouth 2 (two) times daily as needed for refractory nausea / vomiting. Start on day 3 after chemotherapy. 30 tablet 1  . prochlorperazine (COMPAZINE) 10 MG tablet Take 1 tablet (10 mg total) by mouth every 6 (six) hours as needed (Nausea or vomiting). 30 tablet 1   No current facility-administered medications for this visit.    Facility-Administered Medications Ordered in Other Visits  Medication Dose Route Frequency Provider Last Rate Last Dose  . heparin lock flush 100 unit/mL  500 Units Intravenous Once Truitt Merle, MD        PHYSICAL  EXAMINATION: ECOG PERFORMANCE STATUS: 1 - Symptomatic but completely ambulatory  No vitals taken today, Exam not performed today   LABORATORY DATA:  I have reviewed the data as listed CBC Latest Ref Rng & Units 09/01/2019 07/14/2019 06/23/2019  WBC 4.0 - 10.5 K/uL 5.4 4.0 3.9(L)  Hemoglobin 13.0 - 17.0 g/dL 13.4 13.3 13.3  Hematocrit 39.0 - 52.0 % 41.4 40.5 40.6  Platelets 150 - 400 K/uL PLATELET CLUMPS NOTED ON SMEAR, COUNT APPEARS DECREASED 165 147(L)     CMP Latest Ref Rng & Units 09/01/2019 07/14/2019 06/23/2019  Glucose 70 - 99 mg/dL 102(H) 98 102(H)  BUN 8 - 23 mg/dL '20 22 19  '$ Creatinine 0.61 - 1.24 mg/dL 0.97 1.07 0.95  Sodium 135 - 145 mmol/L 142 142 143  Potassium 3.5 - 5.1 mmol/L 3.7 3.8 3.7  Chloride 98 - 111 mmol/L 109 107 108  CO2 22 - 32 mmol/L '25 23 24  '$ Calcium 8.9 - 10.3 mg/dL 8.9 9.4 9.0  Total Protein 6.5 - 8.1 g/dL 6.9 7.0 7.0  Total Bilirubin 0.3 - 1.2 mg/dL 0.5 0.7 0.7  Alkaline Phos 38 - 126 U/L 78 77 76  AST 15 -  41 U/L '18 25 22  '$ ALT 0 - 44 U/L '11 19 14      '$ RADIOGRAPHIC STUDIES: I have personally reviewed the radiological images as listed and agreed with the findings in the report. No results found.   ASSESSMENT & PLAN:  TRINTEN BOUDOIN is a 85 y.o. male with   1. Rectal Cancer, invasive adenocarcinoma,with lung and liver metastasis, cTxNxM1, stage IV, MSS, KRAS mutation(+) -Diagnosed in 09/2017.His initial staging scans showedbilateral lung nodules, highly suspicious for lung metastasis.He declined biopsy of his lung lesions. He was treatedwith ChemoRT with Xeloda for his primary rectal cancer.He lost f/u after 03/2018.  -His 12/25/18 rectal biopsy and2/5/20 Lung biopsy has confirmedrectal caner recurrence with lungmetastasis.  -His FO results shows MSI-stable.KRAS mutation.He is not eligible for immunotherapy or EGFR inhibitor -He was on first line FOLFOX and Avastin with dose reduction from 01/20/19-07/2019 with partial response.  -Per his  request he has been on chemo break since 07/2019.  -Clinically he notes being stable. No pain, mild diarrhea and neuropathy of fingers and toes. He has fallen recently. I encouraged him to use cane or walker, he declined. -I discussed he is eligible for palliative care assistance at home. He will think about it and discuss with his family.   -I will f/u with him in 2 months, he knows to call me if he has any new concerns   2. Diarrhea, Rectal Bleeding  -Secondary to rectal cancer recurrence  -Hematochezia resolved.  -Diarrheamild with 3-4 BMs daily. He notes no help from imodium, but uses Pepto bismol for relief. I will call in lomotil if needed.  3.Goal of care discussion -The patient understands the goal of care is palliative. -I recommend DNR/DNI,he is agreeable with DNR/DNI.   4. Dementia -Pt has appeared to havesome degree of cognitive dysfunction since I saw him the first time, this is much worsewhen he was on chemo, I think he needs further evaluation  -Ipreviouslydiscussed withhis wife. She states pt does not talk much at home, he functions well, she have not noticed any change of his memory orbehavioral issues. -stable   5. Neuropathy, Imbalance, recent Fall   -Secondary to previous chemo with numbness in fingers and has right leg weakness -He is currently on chemo break.  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 This is still effecting his balance. He notes fall yesterday when walking up steps of his home while carrying objects.  -I encouraged him to ambulate with cane or walker, he feels he does not need it currently.     Plan  -Will continue chemo break and observation, we discussed referral to palliative care, he is not very interested  -Lab, flush and f/u in 2 months   No problem-specific Assessment & Plan notes found for this encounter.   No orders of the defined types were placed in this encounter.  I discussed the assessment and treatment  plan with the patient. The patient was provided an opportunity to ask questions and all were answered. The patient agreed with the plan and demonstrated an understanding of the instructions.  The patient was advised to call back or seek an in-person evaluation if the symptoms worsen or if the condition fails to improve as anticipated.  I provided 15 minutes of non face-to-face telephone visit time during this encounter, and > 50% was spent counseling as documented under my assessment & plan.    Truitt Merle, MD 11/01/2019   I, Adrian Banks, am acting as scribe for [employer]  Burr Medico, MD.   I have reviewed the above documentation for accuracy and completeness, and I agree with the above.

## 2019-11-01 ENCOUNTER — Other Ambulatory Visit: Payer: Medicare Other

## 2019-11-01 ENCOUNTER — Encounter: Payer: Self-pay | Admitting: Hematology

## 2019-11-01 ENCOUNTER — Inpatient Hospital Stay: Payer: Medicare Other | Attending: Hematology | Admitting: Hematology

## 2019-11-01 DIAGNOSIS — Z79899 Other long term (current) drug therapy: Secondary | ICD-10-CM | POA: Insufficient documentation

## 2019-11-01 DIAGNOSIS — R197 Diarrhea, unspecified: Secondary | ICD-10-CM | POA: Insufficient documentation

## 2019-11-01 DIAGNOSIS — G629 Polyneuropathy, unspecified: Secondary | ICD-10-CM | POA: Insufficient documentation

## 2019-11-01 DIAGNOSIS — F0390 Unspecified dementia without behavioral disturbance: Secondary | ICD-10-CM | POA: Insufficient documentation

## 2019-11-01 DIAGNOSIS — Z9221 Personal history of antineoplastic chemotherapy: Secondary | ICD-10-CM | POA: Insufficient documentation

## 2019-11-01 DIAGNOSIS — C20 Malignant neoplasm of rectum: Secondary | ICD-10-CM | POA: Diagnosis not present

## 2019-11-01 DIAGNOSIS — C787 Secondary malignant neoplasm of liver and intrahepatic bile duct: Secondary | ICD-10-CM | POA: Insufficient documentation

## 2019-11-01 DIAGNOSIS — C7802 Secondary malignant neoplasm of left lung: Secondary | ICD-10-CM | POA: Insufficient documentation

## 2019-11-02 ENCOUNTER — Telehealth: Payer: Self-pay | Admitting: Hematology

## 2019-11-02 NOTE — Telephone Encounter (Signed)
Scheduled appt per 11/24 los.  Spoke with pt spouse and she is aware of the appt dates and time,

## 2019-11-08 ENCOUNTER — Other Ambulatory Visit: Payer: Medicare Other

## 2019-11-24 ENCOUNTER — Encounter: Payer: Self-pay | Admitting: Orthopaedic Surgery

## 2019-11-24 ENCOUNTER — Ambulatory Visit: Payer: Medicare Other | Admitting: Orthopaedic Surgery

## 2019-11-24 ENCOUNTER — Other Ambulatory Visit: Payer: Self-pay

## 2019-11-24 ENCOUNTER — Ambulatory Visit: Payer: Self-pay

## 2019-11-24 DIAGNOSIS — R29898 Other symptoms and signs involving the musculoskeletal system: Secondary | ICD-10-CM | POA: Diagnosis not present

## 2019-11-24 NOTE — Progress Notes (Signed)
Office Visit Note   Patient: Adrian Banks           Date of Birth: [birth year]           MRN: [id number] Visit Date: 11/24/2019              Requested by: Wenda Low, MD [address] [place],  [place] [address] PCP: Wenda Low, MD   Assessment & Plan: Visit Diagnoses:  1. Weakness of both lower extremities     Plan: Impression is bilateral leg weakness likely from his lumbar spine versus possible total body deconditioning.  We will start the patient in formal physical therapy to work on this.  Have also written a prescription for a rolling walker.  We will follow up with [place] in 2 months time for recheck.  Call with concerns or questions in the meantime.  Follow-Up Instructions: Return in about 2 months (around 01/25/2020).   Orders:  Orders Placed This Encounter  Procedures  . XR Lumbar Spine 2-3 Views  . Ambulatory referral to Physical Therapy   No orders of the defined types were placed in this encounter.     Procedures: No procedures performed   Clinical Data: No additional findings.   Subjective: Chief Complaint  Patient presents with  . Lower Back - Pain    HPI patient is a pleasant 85 year old gentleman who presents to our clinic today with bilateral leg weakness for the past 2 to 3 weeks.  No injury.  He does note that he completed chemotherapy for rectal cancer about a month ago.  His symptoms have been about the same for the past 2 to 3 weeks.  No worsening.  He denies any pain to either lower extremity.  No pain to the lower back.  No history of lumbar pathology.  He does note numbness to both big toes.  He denies any bowel or bladder change no saddle paresthesias.  Review of Systems as detailed in HPI.  All others reviewed and are negative.   Objective: Vital Signs: There were no vitals taken for this visit.  Physical Exam well-developed well-nourished gentleman in no acute distress.  Alert oriented x3.  Ortho Exam examination  of both lower extremities reveals full range of motion and strength with hip flexion, abduction and abduction.  Full strength with resisted knee flexion and extension.  EHL/FHL intact.  He is neurovascularly intact distally.  Specialty Comments:  No specialty comments available.  Imaging: XR Lumbar Spine 2-3 Views  Result Date: 11/24/2019 Multilevel degenerative disc disease worse at L5-S1    PMFS History: Patient Active Problem List   Diagnosis Date Noted  . Port-A-Cath in place 01/26/2019  . Diarrhea 12/29/2018  . GIB (gastrointestinal bleeding) 12/25/2018  . Goals of care, counseling/discussion 11/02/2017  . Iron deficiency anemia due to chronic blood loss 10/23/2017  . Rectal cancer ([place]) 10/21/2017   Past Medical History:  Diagnosis Date  . Cancer ([place]) 09/29/2017   Rectal cvancer  . High cholesterol     Family History  Problem Relation Age of Onset  . Cancer Mother        cancer (unknown type)    Past Surgical History:  Procedure Laterality Date  . BIOPSY  12/25/2018   Procedure: BIOPSY;  Surgeon: Wonda Horner, MD;  Location[facility] ENDOSCOPY;  Service: Endoscopy;;  . FLEXIBLE SIGMOIDOSCOPY N/A 12/25/2018   Procedure: Beryle Quant;  Surgeon: Wonda Horner, MD;  Location: [facility] ENDOSCOPY;  Service: Endoscopy;  Laterality:  N/A;  . IR IMAGING GUIDED PORT INSERTION  01/11/2019   Social History   Occupational History  . Not on file  Tobacco Use  . Smoking status: Former Smoker    Packs/day: 0.50    Years: 10.00    Pack years: 5.00    Quit date: 1988    Years since quitting: 32.9  . Smokeless tobacco: Never Used  Substance and Sexual Activity  . Alcohol use: Yes    Comment: ocassionally   . Drug use: No  . Sexual activity: Not on file

## 2019-12-04 ENCOUNTER — Encounter (HOSPITAL_COMMUNITY): Payer: Self-pay | Admitting: Emergency Medicine

## 2019-12-04 ENCOUNTER — Emergency Department (HOSPITAL_COMMUNITY): Payer: Medicare Other

## 2019-12-04 ENCOUNTER — Emergency Department (HOSPITAL_COMMUNITY)
Admission: EM | Admit: 2019-12-04 | Discharge: 2019-12-04 | Disposition: A | Discharge status: Home / Self Care | Payer: Medicare Other | Attending: Emergency Medicine | Admitting: Emergency Medicine

## 2019-12-04 ENCOUNTER — Other Ambulatory Visit: Payer: Self-pay

## 2019-12-04 DIAGNOSIS — S43014A Anterior dislocation of right humerus, initial encounter: Secondary | ICD-10-CM | POA: Diagnosis not present

## 2019-12-04 DIAGNOSIS — Z79899 Other long term (current) drug therapy: Secondary | ICD-10-CM | POA: Insufficient documentation

## 2019-12-04 DIAGNOSIS — M6281 Muscle weakness (generalized): Secondary | ICD-10-CM | POA: Insufficient documentation

## 2019-12-04 DIAGNOSIS — Y9389 Activity, other specified: Secondary | ICD-10-CM | POA: Insufficient documentation

## 2019-12-04 DIAGNOSIS — S4991XA Unspecified injury of right shoulder and upper arm, initial encounter: Secondary | ICD-10-CM | POA: Diagnosis present

## 2019-12-04 DIAGNOSIS — Y999 Unspecified external cause status: Secondary | ICD-10-CM | POA: Diagnosis not present

## 2019-12-04 DIAGNOSIS — Y92008 Other place in unspecified non-institutional (private) residence as the place of occurrence of the external cause: Secondary | ICD-10-CM | POA: Insufficient documentation

## 2019-12-04 DIAGNOSIS — R200 Anesthesia of skin: Secondary | ICD-10-CM | POA: Insufficient documentation

## 2019-12-04 DIAGNOSIS — Z87891 Personal history of nicotine dependence: Secondary | ICD-10-CM | POA: Diagnosis not present

## 2019-12-04 DIAGNOSIS — W010XXA Fall on same level from slipping, tripping and stumbling without subsequent striking against object, initial encounter: Secondary | ICD-10-CM | POA: Insufficient documentation

## 2019-12-04 DIAGNOSIS — R531 Weakness: Secondary | ICD-10-CM

## 2019-12-04 LAB — BASIC METABOLIC PANEL
Anion gap: 13 (ref 5–15)
BUN: 17 mg/dL (ref 8–23)
CO2: 22 mmol/L (ref 22–32)
Calcium: 9.3 mg/dL (ref 8.9–10.3)
Chloride: 107 mmol/L (ref 98–111)
Creatinine, Ser: 0.91 mg/dL (ref 0.61–1.24)
GFR calc Af Amer: >60 mL/min (ref >60)
GFR calc non Af Amer: >60 mL/min (ref >60)
Glucose, Bld: 121 mg/dL — ABNORMAL HIGH (ref 70–99)
Potassium: 3.8 mmol/L (ref 3.5–5.1)
Sodium: 142 mmol/L (ref 135–145)

## 2019-12-04 LAB — CBC
HCT: 40.2 % (ref 39.0–52.0)
Hemoglobin: 12.9 g/dL — ABNORMAL LOW (ref 13.0–17.0)
MCH: 28.5 pg (ref 26.0–34.0)
MCHC: 32.1 g/dL (ref 30.0–36.0)
MCV: 88.7 fL (ref 80.0–100.0)
Platelets: 218 10*3/uL (ref 150–400)
RBC: 4.53 MIL/uL (ref 4.22–5.81)
RDW: 14.1 % (ref 11.5–15.5)
WBC: 13.5 10*3/uL — ABNORMAL HIGH (ref 4.0–10.5)
nRBC: 0 % (ref 0.0–0.2)

## 2019-12-04 MED ORDER — PROPOFOL 10 MG/ML IV BOLUS
INTRAVENOUS | Status: AC | PRN
  Administered 2019-12-04: 40 mg via INTRAVENOUS

## 2019-12-04 MED ORDER — SODIUM CHLORIDE 0.9% FLUSH: 3.0000 mL | Freq: Once | INTRAVENOUS | Status: DC

## 2019-12-04 MED ORDER — PROPOFOL 10 MG/ML IV BOLUS
0.5000 mg/kg | Freq: Once | INTRAVENOUS | Status: AC
  Administered 2019-12-04: 40 mg via INTRAVENOUS
  Filled 2019-12-04: qty 20

## 2019-12-04 MED ORDER — HYDROMORPHONE HCL 1 MG/ML IJ SOLN
0.5000 mg | Freq: Once | INTRAMUSCULAR | Status: AC
  Administered 2019-12-04: 0.5 mg via INTRAVENOUS
  Filled 2019-12-04: qty 1

## 2019-12-04 NOTE — ED Notes (Signed)
Pt alert and oriented talking with Pharm Tech.  Shoulder immobilizer in place.  Denies any current pain.

## 2019-12-04 NOTE — ED Provider Notes (Signed)
Patient signed out to me awaiting follow-up x-ray of shoulder to confirm the location.  Patient has had some chronic weakness over the last for 5 months.  With frequent falls.  He is starting to use a cane for ambulation.  X-ray shows successful reduction.  He is able to ambulate with 1 person assist.  Family states that they are able to help him with this 1 person assist at home.  I have put in for home health and PT evaluation.  Have also ordered a walker.  Overall he appears to be having chronic deconditioning.  He saw orthopedics about 10 days ago for weakness in both of his legs that was likely from chronic deconditioning.  This injury will likely also set him back.  I offered ED physical therapy evaluation but they prefer to go home at this time as patient does have a lot of support at home.  I stressed the importance of always having a 1 person assist when patient ambulates until he can improve clinically with his shoulder.  Recommend follow-up with orthopedics for shoulder injury as well.  Recommend Tylenol, Motrin, ice for shoulder pain.  This chart was dictated using voice recognition software.  Despite best efforts to proofread,  errors can occur which can change the documentation meaning.     Lennice Sites, DO 12/04/19 1829

## 2019-12-04 NOTE — ED Provider Notes (Addendum)
[facility] EMERGENCY DEPARTMENT Provider Note   CSN: [id number] Arrival date & time: 12/04/19  1132     History Chief Complaint  Patient presents with  . hand numbness x 2 months  . Fall  . Shoulder Pain  . Arm Pain    Adrian Banks is a 85 y.o. male.  Patient c/o fall at home yesterday with injury to right shoulder. States he had gotten up on his own, without cane, and states bil legs felt weak, causing him to fall onto right shoulder. Right shoulder pain constant, dull, moderate, non radiating. Pt denies any RUE numbness or weakness. No neck pain or radicular pain. No back pain. Denies hitting head, loc or headache. No faintness or dizziness prior to fall. No chest pain or discomfort. No palpitations. No sob. Denies abd pain or nv. Notes relatively poor po intake in past few days. No dysuria or gu c/o. Denies change in meds/new meds. No fever or chills.   The history is provided by the patient and the EMS personnel.  Fall Pertinent negatives include no chest pain, no abdominal pain, no headaches and no shortness of breath.  Shoulder Pain Associated symptoms: no back pain, no fever and no neck pain   Arm Pain Pertinent negatives include no chest pain, no abdominal pain, no headaches and no shortness of breath.       Past Medical History:  Diagnosis Date  . Cancer ([place]) 09/29/2017   Rectal cvancer  . High cholesterol     Patient Active Problem List   Diagnosis Date Noted  . Port-A-Cath in place 01/26/2019  . Diarrhea 12/29/2018  . GIB (gastrointestinal bleeding) 12/25/2018  . Goals of care, counseling/discussion 11/02/2017  . Iron deficiency anemia due to chronic blood loss 10/23/2017  . Rectal cancer ([place]) 10/21/2017    Past Surgical History:  Procedure Laterality Date  . BIOPSY  12/25/2018   Procedure: BIOPSY;  Surgeon: Wonda Horner, MD;  Location: [facility] ENDOSCOPY;  Service: Endoscopy;;  . FLEXIBLE SIGMOIDOSCOPY N/A 12/25/2018   Procedure:  Beryle Quant;  Surgeon: Wonda Horner, MD;  Location: [facility] ENDOSCOPY;  Service: Endoscopy;  Laterality: N/A;  . IR IMAGING GUIDED PORT INSERTION  01/11/2019       Family History  Problem Relation Age of Onset  . Cancer Mother        cancer (unknown type)    Social History   Tobacco Use  . Smoking status: Former Smoker    Packs/day: 0.50    Years: 10.00    Pack years: 5.00    Quit date: 1988    Years since quitting: 33.0  . Smokeless tobacco: Never Used  Substance Use Topics  . Alcohol use: Yes    Comment: ocassionally   . Drug use: No    Home Medications Prior to Admission medications   Medication Sig Start Date End Date Taking? Authorizing Provider  diphenoxylate-atropine (LOMOTIL) 2.5-0.025 MG tablet Take 1-2 tablets by mouth 4 (four) times daily as needed for diarrhea or loose stools. 05/05/19   Alla Feeling, NP  finasteride (PROSCAR) 5 MG tablet Take 5 mg by mouth daily.     [provider]  lidocaine-prilocaine (EMLA) cream Apply to affected area once 01/14/19   Truitt Merle, MD  lovastatin (MEVACOR) 40 MG tablet Take 40 mg by mouth daily.     [provider]  naproxen sodium (ALEVE) 220 MG tablet Take 220 mg by mouth daily as needed (pain).    [provider]  ondansetron (ZOFRAN) 8 MG tablet Take 1 tablet (8 mg total) by mouth 2 (two) times daily as needed for refractory nausea / vomiting. Start on day 3 after chemotherapy. 01/14/19   Truitt Merle, MD  prochlorperazine (COMPAZINE) 10 MG tablet Take 1 tablet (10 mg total) by mouth every 6 (six) hours as needed (Nausea or vomiting). 01/14/19   Truitt Merle, MD    Allergies    Patient has no known allergies.  Review of Systems   Review of Systems  Constitutional: Negative for fever.  Eyes: Negative for redness.  Respiratory: Negative for cough and shortness of breath.   Cardiovascular: Negative for chest pain.  Gastrointestinal: Negative for abdominal pain, diarrhea and vomiting.    Genitourinary: Negative for flank pain.  Musculoskeletal: Negative for back pain and neck pain.  Skin: Negative for rash.  Neurological: Negative for weakness, numbness and headaches.  Hematological: Does not bruise/bleed easily.  Psychiatric/Behavioral: Negative for confusion.    Physical Exam Updated Vital Signs BP (!) 157/83 (BP Location: Left Arm)   Pulse 63   Temp 97.7 F (36.5 C) (Oral)   Resp 16   SpO2 98%   Physical Exam Vitals and nursing note reviewed.  Constitutional:      Appearance: Normal appearance. He is well-developed.  HENT:     Head: Atraumatic.     Nose: Nose normal.     Mouth/Throat:     Mouth: Mucous membranes are moist.     Pharynx: Oropharynx is clear.  Eyes:     General: No scleral icterus.    Conjunctiva/sclera: Conjunctivae normal.     Pupils: Pupils are equal, round, and reactive to light.  Neck:     Trachea: No tracheal deviation.  Cardiovascular:     Rate and Rhythm: Normal rate and regular rhythm.     Pulses: Normal pulses.     Heart sounds: Normal heart sounds. No murmur. No friction rub. No gallop.   Pulmonary:     Effort: Pulmonary effort is normal. No accessory muscle usage or respiratory distress.     Breath sounds: Normal breath sounds.  Abdominal:     General: Bowel sounds are normal. There is no distension.     Palpations: Abdomen is soft.     Tenderness: There is no abdominal tenderness. There is no guarding.  Genitourinary:    Comments: No cva tenderness. Musculoskeletal:        General: No swelling.     Cervical back: Normal range of motion and neck supple. No rigidity or tenderness.     Comments: CTLS spine, non tender, aligned, no step off. Tenderness right shoulder, ?dislocation. Radial pulse 2+ bil. Good rom bil ext, no other pain or focal bony tenderness noted.   Skin:    General: Skin is warm and dry.     Findings: No rash.  Neurological:     Mental Status: He is alert.     Comments: Alert, speech clear. Motor  intact bil, stre 5/5. sens grossly intact.   Psychiatric:        Mood and Affect: Mood normal.     ED Results / Procedures / Treatments   Labs (all labs ordered are listed, but only abnormal results are displayed) Results for orders placed or performed during the hospital encounter of [id number]  Basic metabolic panel  Result Value Ref Range   Sodium 142 135 - 145 mmol/L   Potassium 3.8 3.5 - 5.1 mmol/L   Chloride 107 98 - 111  mmol/L   CO2 22 22 - 32 mmol/L   Glucose, Bld 121 (H) 70 - 99 mg/dL   BUN 17 8 - 23 mg/dL   Creatinine, Ser 0.91 0.61 - 1.24 mg/dL   Calcium 9.3 8.9 - 10.3 mg/dL   GFR calc non Af Amer >60 >60 mL/min   GFR calc Af Amer >60 >60 mL/min   Anion gap 13 5 - 15  CBC  Result Value Ref Range   WBC 13.5 (H) 4.0 - 10.5 K/uL   RBC 4.53 4.22 - 5.81 MIL/uL   Hemoglobin 12.9 (L) 13.0 - 17.0 g/dL   HCT 40.2 39.0 - 52.0 %   MCV 88.7 80.0 - 100.0 fL   MCH 28.5 26.0 - 34.0 pg   MCHC 32.1 30.0 - 36.0 g/dL   RDW 14.1 11.5 - 15.5 %   Platelets 218 150 - 400 K/uL   nRBC 0.0 0.0 - 0.2 %   XR Lumbar Spine 2-3 Views  Result Date: 11/24/2019 Multilevel degenerative disc disease worse at L5-S1   EKG EKG Interpretation  Date/Time:  Sunday December 04 2019 11:57:45 EST Ventricular Rate:  59 PR Interval:  156 QRS Duration: 80 QT Interval:  458 QTC Calculation: 453 R Axis:   -44 Text Interpretation: Sinus bradycardia with sinus arrhythmia Left axis deviation Left ventricular hypertrophy No previous tracing Confirmed by Lajean Saver [phone]) on 12/04/2019 12:42:12 PM   Radiology DG Shoulder Right  Result Date: 12/04/2019 CLINICAL DATA:  85 year old whose legs gave out on him earlier today and he fell, injuring the RIGHT shoulder and RIGHT upper arm. Initial encounter. EXAM: RIGHT SHOULDER - 2+ VIEW COMPARISON:  None. FINDINGS: ANTERIOR glenohumeral dislocation. No visible associated acute fractures. Acromioclavicular joint anatomically aligned with mild degenerative  changes. IMPRESSION: ANTERIOR glenohumeral dislocation without visible associated acute fractures. Electronically Signed   By: Evangeline Dakin M.D.   On: 12/04/2019 13:31   DG Humerus Right  Result Date: 12/04/2019 CLINICAL DATA:  85 year old whose legs gave out on him earlier today and he fell, injuring the RIGHT shoulder and RIGHT upper arm. Initial encounter. EXAM: RIGHT HUMERUS - 2+ VIEW COMPARISON:  None. FINDINGS: ANTERIOR glenohumeral dislocation as detailed on the report of the RIGHT shoulder imaging. No fractures involving the humerus. Well-preserved bone mineral density. Visualized elbow joint anatomically aligned without significant degenerative changes. Very small olecranon spur. IMPRESSION: 1. No fractures involving the humerus. 2. ANTERIOR glenohumeral dislocation as detailed on the report of the RIGHT shoulder imaging. Electronically Signed   By: Evangeline Dakin M.D.   On: 12/04/2019 13:33    Procedures .Sedation  Date/Time: 12/04/2019 4:31 PM Performed by: Lajean Saver, MD Authorized by: Lajean Saver, MD   Consent:    Consent obtained:  Verbal   Consent given by:  Patient   Risks discussed:  Allergic reaction, dysrhythmia, inadequate sedation, nausea, prolonged hypoxia resulting in organ damage, prolonged sedation necessitating reversal, respiratory compromise necessitating ventilatory assistance and intubation and vomiting   Alternatives discussed:  Analgesia without sedation, anxiolysis and regional anesthesia Universal protocol:    Procedure explained and questions answered to patient or proxy's satisfaction: yes     Relevant documents present and verified: yes     Test results available and properly labeled: yes     Imaging studies available: yes     Required blood products, implants, devices, and special equipment available: yes     Site/side marked: yes     Immediately prior to procedure a time out was called: yes  Patient identity confirmation method:   Verbally with patient Indications:    Procedure performed:  Dislocation reduction   Procedure necessitating sedation performed by:  Physician performing sedation Pre-sedation assessment:    Time since last food or drink:  12+ hours   ASA classification: class 2 - patient with mild systemic disease     Neck mobility: normal     Mouth opening:  3 or more finger widths   Thyromental distance:  4 finger widths   Mallampati score:  I - soft palate, uvula, fauces, pillars visible   Pre-sedation assessments completed and reviewed: airway patency, cardiovascular function, hydration status, mental status, nausea/vomiting, pain level, respiratory function and temperature     Pre-sedation assessment completed:  12/04/2019 3:30 PM Immediate pre-procedure details:    Reassessment: Patient reassessed immediately prior to procedure     Reviewed: vital signs, relevant labs/tests and NPO status     Verified: bag valve mask available, emergency equipment available, intubation equipment available, IV patency confirmed, oxygen available and suction available   Procedure details (see MAR for exact dosages):    Preoxygenation:  Nasal cannula   Sedation:  Propofol   Intended level of sedation: deep   Intra-procedure monitoring:  Blood pressure monitoring, cardiac monitor, continuous pulse oximetry, frequent LOC assessments, frequent vital sign checks and continuous capnometry   Intra-procedure events: none     Total Provider sedation time (minutes):  30 Post-procedure details:    Post-sedation assessment completed:  12/04/2019 4:32 PM   Attendance: Constant attendance by certified staff until patient recovered     Recovery: Patient returned to pre-procedure baseline     Post-sedation assessments completed and reviewed: airway patency, cardiovascular function, hydration status, mental status, nausea/vomiting, pain level, respiratory function and temperature     Patient is stable for discharge or admission: yes      Patient tolerance:  Tolerated well, no immediate complications .Ortho Injury Treatment  Date/Time: 12/04/2019 4:33 PM Performed by: Lajean Saver, MD Authorized by: Lajean Saver, MD   Consent:    Consent given by:  Patient   Risks discussed:  Irreducible dislocationInjury location: shoulder Location details: right shoulder Injury type: dislocation Dislocation type: anterior Chronicity: new Pre-procedure neurovascular assessment: neurovascularly intact Pre-procedure distal perfusion: normal Pre-procedure neurological function: normal Pre-procedure range of motion: normal  Patient sedated: Yes. Refer to sedation procedure documentation for details of sedation. Manipulation performed: yes Reduction method: traction and counter traction and external rotation Reduction successful: yes X-ray confirmed reduction: yes Immobilization: sling Post-procedure neurovascular assessment: post-procedure neurovascularly intact Post-procedure distal perfusion: normal Post-procedure neurological function: normal Post-procedure range of motion: improved Patient tolerance: patient tolerated the procedure well with no immediate complications    (including critical care time)  Medications Ordered in ED Medications  sodium chloride flush (NS) 0.9 % injection 3 mL (has no administration in time range)    ED Course  I have reviewed the triage vital signs and the nursing notes.  Pertinent labs & imaging results that were available during my care of the patient were reviewed by me and considered in my medical decision making (see chart for details).    MDM Rules/Calculators/A&P                      Labs sent. Imaging ordered.   Reviewed nursing notes and prior charts for additional history.   Labs reviewed/interpreted by me - lytes normal.   Xrays reviewed/interpreted by me  - +dislocated shoulder.   SW asked to evaluate, possible  home health referral and home health resources - she  will discuss w pt and family.   Patient in hall spaces - RN alerted to shoulder dislocation and need for procedural sedation  - will move to room. Dilaudid .5 mg iv for pain in interim.   Discussed xrays with pt.    Recheck post reduction and sling - radial pulse 2+, deformity resolved, pain controlled, motor/sens intact.   1645: Signed out to Dr Ronnald Nian to check post reduction xray, recheck pt, and dispo appropriately.   Final Clinical Impression(s) / ED Diagnoses Final diagnoses:  None    Rx / DC Orders ED Discharge Orders    None         Lajean Saver, MD 12/04/19 1658

## 2019-12-04 NOTE — ED Triage Notes (Signed)
Pt to triage via [facility] from home.  C/o L hand numbness x 2 months.  States he noticed it after finishing chemo 2 months ago.  Pt's legs "gave out" this morning and he fell C/o R shoulder and upper arm pain. No blood thinners.  Denies LOC.

## 2019-12-04 NOTE — Progress Notes (Addendum)
CSW received consult for home health. CSW met with patient and assessed for home health  concerns. CSW notes patient reports that he says he gets around fine and does not want home health. Patient reported he was amenable to outpatient PT. CSW informed RNCM who will work on setting an outpatient PT appointment. Per patient multiple family members live with him and they all drive. Patient reports transportation is not a barrier. CSW will continue to follow for any needed discharge supports.   Daphine Deutscher, LCSW, [place] [facility], [place], [place] [phone]

## 2019-12-04 NOTE — Care Management (Signed)
Out Patient PT referral placed for [facility] information placed on AVS with instructions.

## 2019-12-04 NOTE — Discharge Instructions (Addendum)
It was our pleasure to provide your ER care today - we hope that you feel better.  Wear shoulder sling/immobilizer. Ice/coldpack to sore area.   Fall precautions - use walker/assistance when up and trying to walk.   Take acetaminophen as need for pain.   Follow up with orthopedic doctor in 1 week - call office tomorrow AM to arrange appointment.   Return to ER if worse, new symptoms, fevers, new or severe pain, numbness/weakness, weak/fainting, or other concern.

## 2019-12-04 NOTE — ED Notes (Signed)
Wife on phone for update, and is concerned about the patient and inability to ambulate x 4-5 weeks after chemo tx.  Dr. Vernell Barrier in with patient and called wife as well.  Pt was to have PT at home and has been unable to start.  SW has set up PT to eval again at home and this was discussed with wife.  Pt able to ambulate with quick shuffling steps in the room with 1 person assist.  States he can get around at home using the "walls".   Reviewed that this is not the best method and to use a cane and ask for help.

## 2019-12-05 ENCOUNTER — Encounter (HOSPITAL_COMMUNITY): Payer: Self-pay | Admitting: Emergency Medicine

## 2019-12-05 ENCOUNTER — Emergency Department (HOSPITAL_COMMUNITY)
Admission: EM | Admit: 2019-12-05 | Discharge: 2019-12-06 | Disposition: A | Discharge status: Home / Self Care | Payer: Medicare Other | Attending: Emergency Medicine | Admitting: Emergency Medicine

## 2019-12-05 ENCOUNTER — Telehealth: Payer: Self-pay

## 2019-12-05 ENCOUNTER — Telehealth: Payer: Self-pay | Admitting: Surgery

## 2019-12-05 ENCOUNTER — Other Ambulatory Visit: Payer: Self-pay

## 2019-12-05 DIAGNOSIS — Z79899 Other long term (current) drug therapy: Secondary | ICD-10-CM | POA: Insufficient documentation

## 2019-12-05 DIAGNOSIS — Z20828 Contact with and (suspected) exposure to other viral communicable diseases: Secondary | ICD-10-CM | POA: Diagnosis not present

## 2019-12-05 DIAGNOSIS — Z9181 History of falling: Secondary | ICD-10-CM | POA: Diagnosis not present

## 2019-12-05 DIAGNOSIS — Z85048 Personal history of other malignant neoplasm of rectum, rectosigmoid junction, and anus: Secondary | ICD-10-CM | POA: Insufficient documentation

## 2019-12-05 DIAGNOSIS — N3000 Acute cystitis without hematuria: Secondary | ICD-10-CM | POA: Diagnosis not present

## 2019-12-05 DIAGNOSIS — R410 Disorientation, unspecified: Secondary | ICD-10-CM | POA: Diagnosis not present

## 2019-12-05 DIAGNOSIS — R531 Weakness: Secondary | ICD-10-CM | POA: Insufficient documentation

## 2019-12-05 LAB — COMPREHENSIVE METABOLIC PANEL
ALT: 22 U/L (ref 0–44)
AST: 53 U/L — ABNORMAL HIGH (ref 15–41)
Albumin: 3.1 g/dL — ABNORMAL LOW (ref 3.5–5.0)
Alkaline Phosphatase: 67 U/L (ref 38–126)
Anion gap: 7 (ref 5–15)
BUN: 18 mg/dL (ref 8–23)
CO2: 26 mmol/L (ref 22–32)
Calcium: 8.9 mg/dL (ref 8.9–10.3)
Chloride: 109 mmol/L (ref 98–111)
Creatinine, Ser: 0.97 mg/dL (ref 0.61–1.24)
GFR calc Af Amer: >60 mL/min (ref >60)
GFR calc non Af Amer: >60 mL/min (ref >60)
Glucose, Bld: 113 mg/dL — ABNORMAL HIGH (ref 70–99)
Potassium: 3.8 mmol/L (ref 3.5–5.1)
Sodium: 142 mmol/L (ref 135–145)
Total Bilirubin: 1.3 mg/dL — ABNORMAL HIGH (ref 0.3–1.2)
Total Protein: 6.3 g/dL — ABNORMAL LOW (ref 6.5–8.1)

## 2019-12-05 LAB — CBC WITH DIFFERENTIAL/PLATELET
Abs Immature Granulocytes: 0.06 10*3/uL (ref 0.00–0.07)
Basophils Absolute: 0 10*3/uL (ref 0.0–0.1)
Basophils Relative: 0 %
Eosinophils Absolute: 0.1 10*3/uL (ref 0.0–0.5)
Eosinophils Relative: 1 %
HCT: 38.5 % — ABNORMAL LOW (ref 39.0–52.0)
Hemoglobin: 12.5 g/dL — ABNORMAL LOW (ref 13.0–17.0)
Immature Granulocytes: 1 %
Lymphocytes Relative: 5 %
Lymphs Abs: 0.6 10*3/uL — ABNORMAL LOW (ref 0.7–4.0)
MCH: 28.3 pg (ref 26.0–34.0)
MCHC: 32.5 g/dL (ref 30.0–36.0)
MCV: 87.1 fL (ref 80.0–100.0)
Monocytes Absolute: 1.2 10*3/uL — ABNORMAL HIGH (ref 0.1–1.0)
Monocytes Relative: 11 %
Neutro Abs: 9.4 10*3/uL — ABNORMAL HIGH (ref 1.7–7.7)
Neutrophils Relative %: 82 %
Platelets: 238 10*3/uL (ref 150–400)
RBC: 4.42 MIL/uL (ref 4.22–5.81)
RDW: 14.4 % (ref 11.5–15.5)
WBC: 11.3 10*3/uL — ABNORMAL HIGH (ref 4.0–10.5)
nRBC: 0 % (ref 0.0–0.2)

## 2019-12-05 NOTE — ED Notes (Signed)
giareau Goodhart [phone] (pt son) if needed for additional questions

## 2019-12-05 NOTE — ED Triage Notes (Signed)
Patient arrived with EMS from home reports generalized weakness today with bilateral legs weakness and  frequent falls for the last several days , seen here yesterday dislocated his right shoulder joint after a fall .

## 2019-12-05 NOTE — Telephone Encounter (Signed)
Patient's wife calls stating that he has had recent falls, was seen in ED yesterday, she states his body is "lifeless" meaning he can't do anything for himself and she is unable to help him due to her age.  She is asking about options for his care.  I explained I would have one of our SW reach out to her to discuss options.

## 2019-12-05 NOTE — Telephone Encounter (Signed)
I will schedule a virtual or office visit with pt and his wife, to discuss palliative care and hospice with them.   Truitt Merle MD

## 2019-12-05 NOTE — Telephone Encounter (Signed)
ED CM received call from patient's wife today concerning spouse unable to care for patient at home.  She reported she is not physically capable of providing care at his current health state.  CM inquired if there was family to assist, according to spouse she could possibly have her son's assist.  CM advised her to contact patient's PCP for assistance with supportive care tomorrow, Spouse verbalized understanding, teach back done.

## 2019-12-06 ENCOUNTER — Telehealth: Payer: Self-pay | Admitting: Hematology

## 2019-12-06 LAB — URINALYSIS, ROUTINE W REFLEX MICROSCOPIC
Glucose, UA: NEGATIVE mg/dL
Ketones, ur: NEGATIVE mg/dL
Nitrite: NEGATIVE
Protein, ur: 30 mg/dL — AB
Specific Gravity, Urine: 1.024 (ref 1.005–1.030)
pH: 5 (ref 5.0–8.0)

## 2019-12-06 LAB — SARS CORONAVIRUS 2 (TAT 6-24 HRS): SARS Coronavirus 2: NEGATIVE

## 2019-12-06 MED ORDER — CEPHALEXIN 500 MG PO CAPS: ORAL_CAPSULE | ORAL | 0 refills | Status: AC

## 2019-12-06 MED ORDER — CEPHALEXIN 250 MG PO CAPS
500.0000 mg | ORAL_CAPSULE | Freq: Two times a day (BID) | ORAL | Status: DC
  Administered 2019-12-06 (×2): 500 mg via ORAL
  Filled 2019-12-06 (×2): qty 2

## 2019-12-06 NOTE — NC FL2 (Signed)
[place] LEVEL OF CARE SCREENING TOOL     IDENTIFICATION  Patient Name: Adrian Banks Birthdate: 03-13-1932 Sex: male Admission Date (Current Location): 12/05/2019  [facility] and [place] Number:  Herbalist and Address:  [facility], [place] [address] [place], [place] [address]      Provider Number: [id number]  Attending Physician Name and Address:  Davonna Belling, MD  Relative Name and Phone Number:  Sherrick Schaper, [phone]    Current Level of Care: Hospital Recommended Level of Care: [place] Prior Approval Number:    Date Approved/Denied:   PASRR Number: [id number]  Discharge Plan: SNF    Current Diagnoses: Patient Active Problem List   Diagnosis Date Noted  . Port-A-Cath in place 01/26/2019  . Diarrhea 12/29/2018  . GIB (gastrointestinal bleeding) 12/25/2018  . Goals of care, counseling/discussion 11/02/2017  . Iron deficiency anemia due to chronic blood loss 10/23/2017  . Rectal cancer ([place]) 10/21/2017    Orientation RESPIRATION BLADDER Height & Weight     Self, Time, Situation, Place  Normal Incontinent Weight:   Height:     BEHAVIORAL SYMPTOMS/MOOD NEUROLOGICAL BOWEL NUTRITION STATUS      Incontinent    AMBULATORY STATUS COMMUNICATION OF NEEDS Skin   Limited Assist Verbally Normal                       Personal Care Assistance Level of Assistance  Bathing, Dressing, Feeding Bathing Assistance: Independent Feeding assistance: Limited assistance Dressing Assistance: Limited assistance     Functional Limitations Info  Speech, Sight, Hearing Sight Info: Adequate Hearing Info: Adequate Speech Info: Adequate    SPECIAL CARE FACTORS FREQUENCY  PT (By licensed PT), OT (By licensed OT)     PT Frequency: 5x weekly OT Frequency: 5x weekly            Contractures Contractures Info: Not present    Additional Factors Info  Code Status Code Status Info: Full Code              Current Medications (12/06/2019):  This is the current hospital active medication list Current Facility-Administered Medications  Medication Dose Route Frequency Provider Last Rate Last Admin  . cephALEXin (KEFLEX) capsule 500 mg  500 mg Oral Q12H Davonna Belling, MD   500 mg at 12/06/19 1012   Current Outpatient Medications  Medication Sig Dispense Refill  . diphenoxylate-atropine (LOMOTIL) 2.5-0.025 MG tablet Take 1-2 tablets by mouth 4 (four) times daily as needed for diarrhea or loose stools. 60 tablet 1  . finasteride (PROSCAR) 5 MG tablet Take 5 mg by mouth daily.     Marland Kitchen lidocaine-prilocaine (EMLA) cream Apply to affected area once (Patient not taking: Reported on 12/04/2019) 30 g 3  . lovastatin (MEVACOR) 40 MG tablet Take 40 mg by mouth daily.     . naproxen sodium (ALEVE) 220 MG tablet Take 220 mg by mouth daily as needed (pain).    . ondansetron (ZOFRAN) 8 MG tablet Take 1 tablet (8 mg total) by mouth 2 (two) times daily as needed for refractory nausea / vomiting. Start on day 3 after chemotherapy. (Patient not taking: Reported on 12/04/2019) 30 tablet 1  . prochlorperazine (COMPAZINE) 10 MG tablet Take 1 tablet (10 mg total) by mouth every 6 (six) hours as needed (Nausea or vomiting). (Patient not taking: Reported on 12/04/2019) 30 tablet 1   Facility-Administered Medications Ordered in Other Encounters  Medication Dose Route Frequency Provider Last  Rate Last Admin  . heparin lock flush 100 unit/mL  500 Units Intravenous Once Truitt Merle, MD         Discharge Medications: Please see discharge summary for a list of discharge medications.  Relevant Imaging Results:  Relevant Lab Results:   Additional Information SSN: [id number]  Archie Endo, LCSW

## 2019-12-06 NOTE — ED Provider Notes (Signed)
He has been accepted for transfer to a local nursing home.  See attached documentation.  Unable to has been completed, and includes the prescription for cephalexin which was started today.  This is to treat a urinary tract infection.  Duration of treatment should continue for 7 days.   Daleen Bo, MD 12/06/19 [phone]

## 2019-12-06 NOTE — Telephone Encounter (Signed)
Called pt wife per 12/28 sch message. Pt still in the hospital. Per wife unsure when we can do a virtual visit. Message sent to MD

## 2019-12-06 NOTE — ED Provider Notes (Addendum)
[facility] EMERGENCY DEPARTMENT Provider Note   CSN: [id number] Arrival date & time: 12/05/19  2142     History Chief Complaint  Patient presents with  . Generalized Weakness    Adrian Banks is a 85 y.o. male.  HPI Patient presents with generalized weakness.  Seen yesterday after a fall with shoulder dislocation.  Had been discharged home with plan for home PT.  He has stage IV rectal cancer.  Has reportedly been weakening overall.  Reviewing notes it appears as if his oncologist is planning for hospice palliative care referral.  However patient's wife apparently is not able to care for him at home.  No new injury since yesterday.  Feels weak all over.  States has had decreased appetite.    Past Medical History:  Diagnosis Date  . 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 High cholesterol     Patient Active Problem List   Diagnosis Date Noted  . Port-A-Cath in place 01/26/2019  . Diarrhea 12/29/2018  . GIB (gastrointestinal bleeding) 12/25/2018  . Goals of care, counseling/discussion 11/02/2017  . Iron deficiency anemia due to chronic blood loss 10/23/2017  . Rectal cancer ([place]) 10/21/2017    Past Surgical History:  Procedure Laterality Date  . BIOPSY  12/25/2018   Procedure: BIOPSY;  Surgeon: Wonda Horner, MD;  Location: [facility] ENDOSCOPY;  Service: Endoscopy;;  . FLEXIBLE SIGMOIDOSCOPY N/A 12/25/2018   Procedure: Beryle Quant;  Surgeon: Wonda Horner, MD;  Location: [facility] ENDOSCOPY;  Service: Endoscopy;  Laterality: N/A;  . IR IMAGING GUIDED PORT INSERTION  01/11/2019       Family History  Problem Relation Age of Onset  . Cancer Mother        cancer (unknown type)    Social History   Tobacco Use  . Smoking status: Former Smoker    Packs/day: 0.50    Years: 10.00    Pack years: 5.00    Quit date: 1988    Years since quitting: 33.0  . Smokeless tobacco: Never Used  Substance Use Topics  . Alcohol use: Yes    Comment:  ocassionally   . Drug use: No    Home Medications Prior to Admission medications   Medication Sig Start Date End Date Taking? Authorizing Provider  diphenoxylate-atropine (LOMOTIL) 2.5-0.025 MG tablet Take 1-2 tablets by mouth 4 (four) times daily as needed for diarrhea or loose stools. 05/05/19   Alla Feeling, NP  finasteride (PROSCAR) 5 MG tablet Take 5 mg by mouth daily.     [provider]  lidocaine-prilocaine (EMLA) cream Apply to affected area once Patient not taking: Reported on 12/04/2019 01/14/19   Truitt Merle, MD  lovastatin (MEVACOR) 40 MG tablet Take 40 mg by mouth daily.     [provider]  naproxen sodium (ALEVE) 220 MG tablet Take 220 mg by mouth daily as needed (pain).    [provider]  ondansetron (ZOFRAN) 8 MG tablet Take 1 tablet (8 mg total) by mouth 2 (two) times daily as needed for refractory nausea / vomiting. Start on day 3 after chemotherapy. Patient not taking: Reported on 12/04/2019 01/14/19   Truitt Merle, MD  prochlorperazine (COMPAZINE) 10 MG tablet Take 1 tablet (10 mg total) by mouth every 6 (six) hours as needed (Nausea or vomiting). Patient not taking: Reported on 12/04/2019 01/14/19   Truitt Merle, MD    Allergies    Patient has no known allergies.  Review of Systems   Review  of Systems  Constitutional: Positive for appetite change and fatigue.  HENT: Negative for congestion.   Respiratory: Negative for shortness of breath.   Gastrointestinal: Negative for abdominal pain.  Genitourinary: Negative for flank pain.  Musculoskeletal: Negative for back pain.  Skin: Negative for wound.  Neurological: Positive for weakness.  Psychiatric/Behavioral: Positive for confusion.    Physical Exam Updated Vital Signs BP 123/69   Pulse 69   Temp 97.8 F (36.6 C) (Oral)   Resp 15   SpO2 99%   Physical Exam Vitals and nursing note reviewed.  HENT:     Head: Normocephalic.  Eyes:     Pupils: Pupils are equal, round, and reactive to  light.  Cardiovascular:     Rate and Rhythm: Regular rhythm.  Pulmonary:     Breath sounds: No wheezing or rhonchi.  Abdominal:     Tenderness: There is no abdominal tenderness.  Musculoskeletal:     Right lower leg: No edema.     Left lower leg: No edema.  Skin:    General: Skin is warm.     Capillary Refill: Capillary refill takes less than 2 seconds.  Neurological:     Mental Status: He is alert.     Comments: Awake and somewhat slow to answer.  May have a mild confusion.  Moves all 4 extremities.  Some weakness bilateral lower extremities.  No lumbar tenderness.     ED Results / Procedures / Treatments   Labs (all labs ordered are listed, but only abnormal results are displayed) Labs Reviewed  CBC WITH DIFFERENTIAL/PLATELET - Abnormal; Notable for the following components:      Result Value   WBC 11.3 (*)    Hemoglobin 12.5 (*)    HCT 38.5 (*)    Neutro Abs 9.4 (*)    Lymphs Abs 0.6 (*)    Monocytes Absolute 1.2 (*)    All other components within normal limits  COMPREHENSIVE METABOLIC PANEL - Abnormal; Notable for the following components:   Glucose, Bld 113 (*)    Total Protein 6.3 (*)    Albumin 3.1 (*)    AST 53 (*)    Total Bilirubin 1.3 (*)    All other components within normal limits  URINALYSIS, ROUTINE W REFLEX MICROSCOPIC - Abnormal; Notable for the following components:   Color, Urine AMBER (*)    APPearance HAZY (*)    Hgb urine dipstick SMALL (*)    Bilirubin Urine SMALL (*)    Protein, ur 30 (*)    Leukocytes,Ua MODERATE (*)    Bacteria, UA RARE (*)    All other components within normal limits  URINE CULTURE    EKG EKG Interpretation  Date/Time:  Monday December 05 2019 21:44:05 EST Ventricular Rate:  96 PR Interval:  152 QRS Duration: 94 QT Interval:  386 QTC Calculation: 487 R Axis:   -60 Text Interpretation: Sinus rhythm with frequent Premature ventricular complexes Left anterior fascicular block Left ventricular hypertrophy ( R in aVL  , Cornell product , Romhilt-Estes ) Cannot rule out Septal infarct , age undetermined Abnormal ECG Confirmed by Davonna Belling [phone]) on 12/06/2019 7:16:32 AM   Radiology XR Shoulder R  Result Date: 12/04/2019 CLINICAL DATA:  Status post reduction of anterior dislocation of the right humeral head. EXAM: RIGHT SHOULDER - 2+ VIEW 5:51 p.m. COMPARISON:  12/04/2019 at 1:01 p.m. FINDINGS: The anterior dislocation of the humeral head has been reduced. No visible fracture. IMPRESSION: Reduction of the anterior dislocation of the right  humeral head. Electronically Signed   By: Lorriane Shire M.D.   On: 12/04/2019 18:12   DG Shoulder Right  Result Date: 12/04/2019 CLINICAL DATA:  85 year old whose legs gave out on him earlier today and he fell, injuring the RIGHT shoulder and RIGHT upper arm. Initial encounter. EXAM: RIGHT SHOULDER - 2+ VIEW COMPARISON:  None. FINDINGS: ANTERIOR glenohumeral dislocation. No visible associated acute fractures. Acromioclavicular joint anatomically aligned with mild degenerative changes. IMPRESSION: ANTERIOR glenohumeral dislocation without visible associated acute fractures. Electronically Signed   By: Evangeline Dakin M.D.   On: 12/04/2019 13:31   DG Humerus Right  Result Date: 12/04/2019 CLINICAL DATA:  85 year old whose legs gave out on him earlier today and he fell, injuring the RIGHT shoulder and RIGHT upper arm. Initial encounter. EXAM: RIGHT HUMERUS - 2+ VIEW COMPARISON:  None. FINDINGS: ANTERIOR glenohumeral dislocation as detailed on the report of the RIGHT shoulder imaging. No fractures involving the humerus. Well-preserved bone mineral density. Visualized elbow joint anatomically aligned without significant degenerative changes. Very small olecranon spur. IMPRESSION: 1. No fractures involving the humerus. 2. ANTERIOR glenohumeral dislocation as detailed on the report of the RIGHT shoulder imaging. Electronically Signed   By: Evangeline Dakin M.D.   On:  12/04/2019 13:33    Procedures Procedures (including critical care time)  Medications Ordered in ED Medications  cephALEXin (KEFLEX) capsule 500 mg (has no administration in time range)    ED Course  I have reviewed the triage vital signs and the nursing notes.  Pertinent labs & imaging results that were available during my care of the patient were reviewed by me and considered in my medical decision making (see chart for details).    MDM Rules/Calculators/A&P                      Patient with generalized weakness.  Has been worsening over the last few months.  Also worse in lower extremities.  Has seen orthopedic surgery.  Some thought of could be spinal involvement apparently had x-ray done, however it sounds like they are limiting his treatments with heme-onc.  Talk of hospice and palliative care.  Do not think he needs a spinal MRI at this time.  Will have patient seen by transitions of care.  Urine is somewhat suspicious for infection.  Has white cells but only rare bacteria.  Has some leukocyte esterace.  Will culture but and will start antibiotics. Final Clinical Impression(s) / ED Diagnoses Final diagnoses:  Generalized weakness  Acute cystitis without hematuria    Rx / DC Orders ED Discharge Orders    None       Davonna Belling, MD 12/06/19 [id number]    Davonna Belling, MD 12/06/19 [phone]

## 2019-12-06 NOTE — ED Notes (Signed)
Pt eating dinner

## 2019-12-06 NOTE — Progress Notes (Signed)
CSW spoke with Tammy from [facility]. Pt is ready for transfer via [facility] to room 114 in [place]. Report # is [id number]. CSW informed RN Veterinary surgeon via chat

## 2019-12-06 NOTE — ED Notes (Signed)
Called [facility] stated pt is 3rd down on list

## 2019-12-06 NOTE — ED Notes (Signed)
Report called to [facility] @ [facility] - [phone] and [facility] notified of need for transport.

## 2019-12-06 NOTE — Discharge Instructions (Signed)
Follow-up with facility doctor when you get there for a checkup and also for repeat urine test in about a week.  Continue taking the antibiotics for 1 week.

## 2019-12-06 NOTE — ED Notes (Signed)
RN called [facility] to inform patient is en route via [facility]

## 2019-12-06 NOTE — ED Notes (Signed)
Pt eating lunch then requested he needed to go to bathroom. Pt escorted to bathroom - pt ambulatory w/walker. Pt noted w/stool in brief. Pt finished BM in toilet. Pt required assistance w/cleansing. Pt ambulated back to room w/walker after new brief applied. Pt finishing eating lunch.

## 2019-12-06 NOTE — Evaluation (Signed)
Physical Therapy Evaluation Patient Details Name: Adrian Banks MRN: [id number] DOB: 11-04-1932 Today's Date: 12/06/2019   History of Present Illness  85yo male s/p fall at home and with shoulder injury. Imaging shows anterior R shoulder dislocation which was successfully reduced in the ED. Wife states inability to care for patient at home. PMH rectal CA  Clinical Impression   Patient received on stretcher, reporting "I need to go to the bathroom right now!"; noted pants and underwear already soiled. He was able to bridge for placement of bed pain, only able to have small amount of BM and did continue to urinate in urinal provided. He required MinA for rolling, Min-ModA for functional bed mobility, MinA for functional transfers, and was able to take sidesteps up EOB with MinA for balance/RW management. Noted posterior bias and poor safety awareness in standing, gross weakness leading to difficulty maintaining balance. He was left in bed positioned to comfort with all needs met this morning. Spouse reports significant concerns of inability to care for patient, and he has had multiple falls recently. Currently recommend SNF and 24/7A moving forward.     Follow Up Recommendations SNF;Supervision/Assistance - 24 hour    Equipment Recommendations  Rolling walker with 5" wheels;3in1 (PT)    Recommendations for Other Services       Precautions / Restrictions Precautions Precautions: Fall;Other (comment) Precaution Comments: recent R shoulder dislocation and reduction- sling is in notes but do not see it on patient or in room Restrictions Weight Bearing Restrictions: No      Mobility  Bed Mobility Overal bed mobility: Needs Assistance Bed Mobility: Supine to Sit;Sit to Supine     Supine to sit: Mod assist Sit to supine: Min assist   General bed mobility comments: Min-ModA for BLE management  Transfers Overall transfer level: Needs assistance Equipment used: Rolling walker (2  wheeled) Transfers: Sit to/from Stand Sit to Stand: Min assist         General transfer comment: MinA to boost to full standing position and gain balance, posterior bias noted  Ambulation/Gait             General Gait Details: able to take side steps up bed with MinA for balance/RW management  Stairs            Wheelchair Mobility    Modified Rankin (Stroke Patients Only)       Balance Overall balance assessment: Needs assistance;History of Falls Sitting-balance support: Bilateral upper extremity supported;Feet supported Sitting balance-Leahy Scale: Good Sitting balance - Comments: S for safety   Standing balance support: Bilateral upper extremity supported;During functional activity Standing balance-Leahy Scale: Poor Standing balance comment: posterior lean and bias                             Pertinent Vitals/Pain Pain Assessment: No/denies pain    Home Living Family/patient expects to be discharged to:: Private residence Living Arrangements: Spouse/significant other;Other (Comment)(son and daughter as well) Available Help at Discharge: Family;Available 24 hours/day Type of Home: House Home Access: Stairs to enter Entrance Stairs-Rails: Can reach both Entrance Stairs-Number of Steps: 5 with B rails , also has do a flight going down to basement with U rail Home Layout: Multi-level;Laundry or work area in [place]: [name] - single point Additional Comments: cane but has never used it    Prior Function           Comments: states he didn't really need help (  inconsistent with wife's report from chart), "just needed help getting to bathroom" per chart review, wife certainly makes it sound like he needs a great deal of assistance      Hand Dominance        Extremity/Trunk Assessment   Upper Extremity Assessment Upper Extremity Assessment: Generalized weakness    Lower Extremity Assessment Lower Extremity Assessment:  Generalized weakness    Cervical / Trunk Assessment Cervical / Trunk Assessment: Kyphotic  Communication   Communication: No difficulties  Cognition Arousal/Alertness: Awake/alert Behavior During Therapy: WFL for tasks assessed/performed;Flat affect Overall Cognitive Status: No family/caregiver present to determine baseline cognitive functioning                                 General Comments: A&Ox4 but still functionally confused- states phone number when asked for birthday      General Comments General comments (skin integrity, edema, etc.): VSS on room air    Exercises     Assessment/Plan    PT Assessment Patient needs continued PT services  PT Problem List Decreased strength;Decreased cognition;Decreased activity tolerance;Decreased safety awareness;Decreased balance;Decreased knowledge of precautions;Decreased mobility;Decreased coordination       PT Treatment Interventions DME instruction;Balance training;Gait training;Neuromuscular re-education;Stair training;Cognitive remediation;Functional mobility training;Patient/family education;Therapeutic activities;Therapeutic exercise    PT Goals (Current goals can be found in the Care Plan section)  Acute Rehab PT Goals Patient Stated Goal: go home, feel better PT Goal Formulation: With patient Time For Goal Achievement: 12/20/19 Potential to Achieve Goals: Fair    Frequency Min 2X/week   Barriers to discharge        Co-evaluation               AM-PAC PT "6 Clicks" Mobility  Outcome Measure Help needed turning from your back to your side while in a flat bed without using bedrails?: A Lot Help needed moving from lying on your back to sitting on the side of a flat bed without using bedrails?: A Lot Help needed moving to and from a bed to a chair (including a wheelchair)?: A Little Help needed standing up from a chair using your arms (e.g., wheelchair or bedside chair)?: A Little Help needed to  walk in hospital room?: A Lot Help needed climbing 3-5 steps with a railing? : Total 6 Click Score: 13    End of Session Equipment Utilized During Treatment: Gait belt Activity Tolerance: Patient tolerated treatment well Patient left: in bed;with call bell/phone within reach   PT Visit Diagnosis: Unsteadiness on feet (R26.81);History of falling (Z91.81);Muscle weakness (generalized) (M62.81);Difficulty in walking, not elsewhere classified (R26.2)    Time: 0258-5277 PT Time Calculation (min) (ACUTE ONLY): 30 min   Charges:   PT Evaluation $PT Eval Moderate Complexity: 1 Mod PT Treatments $Therapeutic Activity: 8-22 mins        Windell Norfolk, DPT, PN1   Supplemental Physical Therapist [place]    Pager [phone] Acute Rehab Office [phone]

## 2019-12-06 NOTE — ED Notes (Signed)
Walked pt to bathroom

## 2019-12-06 NOTE — Progress Notes (Signed)
EDCSW faxed AVS and COVID negative results to Tammy at [facility].

## 2019-12-06 NOTE — ED Notes (Signed)
Spouse advised she wants SNF to be sought for pt as she is not able to care for him at home.

## 2019-12-06 NOTE — ED Notes (Signed)
Diet was ordered for Lunch. 

## 2019-12-06 NOTE — Progress Notes (Addendum)
3pm: Patient will discharge to [facility] after 4pm via [facility]. Patient can discharge once admission paperwork is completed by the patient's wife at the facility.   2pm: CSW presented patient with bed offers from [facility], [facility], and [facility]. Patient requested CSW contact his wife for a decision.  CSW spoke with patient's wife Sherrine Maples who states acceptance of bed offer from [facility].  CSW notified Ebony Hail from [facility] of acceptance. Ebony Hail to coordinate with Tammy to obtain room information for admission.  11am: CSW received consult for patient for SNF placement. PT evaluated patient and SNF was recommended. RN spoke with patient to inquire about SNF placement and patient is agreeable for CSW to begin faxing his information out.  CSW faxed patient's information to [facility] for possible admission. CSW will present patient with bed offers when available.  Madilyn Fireman, MSW, LCSW-A Transitions of Care  Clinical Social Worker  [facility] Emergency Departments  Medical ICU [phone]

## 2019-12-06 NOTE — Care Management (Signed)
ED CM faxed  keflex prescription to [facility].

## 2019-12-06 NOTE — ED Notes (Signed)
Dinner tray ordered.

## 2019-12-06 NOTE — ED Notes (Signed)
Pt voiced understanding and agreement w/tx plan - SW seeking SNF. Pt on phone talking w/his spouse.

## 2019-12-07 LAB — URINE CULTURE: Culture: NO GROWTH

## 2019-12-14 ENCOUNTER — Telehealth: Payer: Self-pay

## 2019-12-14 NOTE — Telephone Encounter (Signed)
Spoke with patient's wife to request that she call our office and let us know when he gets discharged home from SNF.  She verbalized an understanding and states she will.

## 2019-12-21 ENCOUNTER — Ambulatory Visit: Payer: Medicare Other | Admitting: Physical Therapy

## 2020-01-02 ENCOUNTER — Other Ambulatory Visit: Payer: Medicare Other

## 2020-01-02 ENCOUNTER — Ambulatory Visit: Payer: Medicare Other | Admitting: Hematology

## 2020-01-04 ENCOUNTER — Inpatient Hospital Stay: Payer: Medicare Other | Attending: Hematology | Admitting: Hematology

## 2020-01-04 ENCOUNTER — Telehealth: Payer: Self-pay | Admitting: Hematology

## 2020-01-04 ENCOUNTER — Encounter: Payer: Self-pay | Admitting: Hematology

## 2020-01-04 DIAGNOSIS — C20 Malignant neoplasm of rectum: Secondary | ICD-10-CM

## 2020-01-04 MED ORDER — HYDROCODONE-ACETAMINOPHEN 5-300 MG PO TABS: 1.0000 | ORAL_TABLET | Freq: Three times a day (TID) | ORAL | 0 refills | Status: AC | PRN

## 2020-01-04 NOTE — Addendum Note (Signed)
Addended by: Ihor Gully A on: 01/04/2020 02:17 PM   Modules accepted: Orders

## 2020-01-04 NOTE — Telephone Encounter (Signed)
[facility], f/u open

## 2020-01-04 NOTE — Progress Notes (Signed)
Adrian Banks   [phone] [phone]   Clinic Follow up Note   Patient Care Team: Wenda Low, MD as PCP - General (Internal Medicine)   I connected with Adrian Banks on 01/04/2020 at  1:00 PM EST by telephone visit and verified that I am speaking with the correct person using two identifiers.  I discussed the limitations, risks, security and privacy concerns of performing an evaluation and management service by telephone and the availability of in person appointments. I also discussed with the patient that there may be a patient responsible charge related to this service. The patient expressed understanding and agreed to proceed.   Other persons participating in the visit and their role in the encounter:  His wife took the call  Patient's location:  Home Provider's location:   My office   CHIEF COMPLAINT: F/u of Metastatic Rectal Cancer   SUMMARY OF ONCOLOGIC HISTORY: Oncology History Overview Note  Cancer Staging Rectal cancer [facility]) Staging form: Colon and Rectum, AJCC 8th Edition - Clinical stage from 09/29/2017: Stage IVA (cTX, cNX, cM1a) - Signed by Truitt Merle, MD on 03/21/2018    Rectal cancer ([place])  09/29/2017 Procedure   Colonoscopy by Dr. Penelope Coop 09/29/17  Findings:  The perianal and digital rectal examination were normal.  A non-obstructing large mass was found in the proximal rectum and in the mid rectum.  The mass was circumferential.  This was biopsied with a cold forceps for histology.  Area was tattooed with an injection of [place] ink both distal and proximal to the mass.  There is about 4-5 cm of rectal mucosa distal to the mass that looks norma. This mass is about 7-8 cm in length.  A 10 mm polyp was found in the ascending colon. The polyp was sessile. the polyp was removed with a hot snare.  Resection and retrieval were complete.     09/29/2017 Initial Biopsy   FINAL DIAGNOSIS 09/29/17  1. LG Intestine - Ascending Colon  Polyp:  TUBULAR ADENOMA 2. LG Intestine - Rectum, Bx: INVASIVE MODERATELY DIFFERENTIATED ADENOCARCINOMA. THE MSI TESTING IS PENDING AND THE RESULT WILL BE REPORTED AS AN ADDENDUM.   09/29/2017 Cancer Staging   Staging form: Colon and Rectum, AJCC 8th Edition - Clinical stage from 09/29/2017: Stage IVA (cTX, cNX, cM1a) - Signed by Truitt Merle, MD on 03/21/2018   09/29/2017 Miscellaneous   Initial Biopsy MMR Results: MSI Stable     10/21/2017 Initial Diagnosis   Rectal cancer ([place])   10/28/2017 Imaging   CT CAP W Contrast  IMPRESSION: 1. Long segment circumferential wall thickening of the rectosigmoid colon consistent with known adenocarcinoma. No evidence of bowel obstruction or perforation. 2. No definite signs of metastatic disease in the abdomen or pelvis. Soft tissue nodularity in the right pelvis is probably due to an unopacified pelvic vein. 3. Multiple pulmonary nodules, some cavitary. These are not typical of metastatic adenocarcinoma (especially without evidence of abdominal disease). Consider multicentric or metastatic lung cancer. PET-CT may be helpful to assess hypermetabolic activity and guided tissue sampling. 4. Multiple bladder calculi consistent with chronic bladder outlet obstruction from prostatomegaly. Nonobstructing right renal calculus. No hydronephrosis. 5. Aortic Atherosclerosis (ICD10-I70.0). Additional incidental findings including a moderate size hiatal hernia and renal cysts.    11/05/2017 - 12/18/2017 Radiation Therapy   Radiation with Dr. Lisbeth Renshaw With Xeloda   11/09/2017 - 12/18/2017 Chemotherapy   Chemoradiation with Xeloda 871m/m2 q12h, M-F on days of radiation plan to starting 11/09/17   11/11/2017  Imaging   PET Scan  IMPRESSION:  Wall thickening/ mass involving the upper rectum, corresponding to the patient's known rectal adenocarcinoma. Bilateral pulmonary nodules, partially cavitary in the left upper lobe nodule, suspicious for metastases.  Notably, cavitary metastases are more common with squamous cell carcinoma rather than the patient's known adenocarcinoma.    12/25/2018 Procedure   12/25/2018 Sigmoidoscopy Impression: - Malignant tumor in the proximal rectum, in the mid rectum and in the recto-sigmoid colon. Biopsied.     12/25/2018 Pathology Results   Diagnosis 12/25/18 Rectum, biopsy - ADENOCARCINOMA. Microscopic Banks   12/25/2018 Imaging   12/25/2018 CT AP IMPRESSION: 1. Wall thickening and enhancement of the rectum and distal sigmoid colon, consistent with known rectal carcinoma. 2. New hypoattenuating lesion in the right hepatic lobe, concerning for metastatic disease. 3. Increased size of posterior right lung base mass, now measuring 5.4 x 3.8 cm, previously 1.8 x 2.0 cm. Multiple right lower lobe pulmonary nodules, worsened since 10/28/2017, and consistent with metastatic disease. 4. Multiple large calculi within the urinary bladder.   01/12/2019 Pathology Results   Diagnosis Lung, biopsy - METASTATIC ADENOCARCINOMA WITH NECROSIS, CONSISTENT WITH PATIENT'S CLINICAL HISTORY OF PRIMARY COLORECTAL CARCINOMA.   01/12/2019 Miscellaneous   Foundation One 01/12/19  MSI-stable  KRAS G12V MRAS Wildtype (Codones 12, 13, 59, 61, 117, and 146 in exons 2, 3 and 4) Tumor Mutational Burden - 5 Muts/Mb Alk R1192Q APC F621HY*8 APC Splice Site 6578+4O>N ASXL1 R1415* MEN1 MEN1([id number])  SF3B1 K666N TP53 R273C   01/20/2019 Imaging   CT Chest Wo Contrast  IMPRESSION: Multiple bilateral pulmonary metastases, including index lesions as above, progressive from 2018.  Aortic Atherosclerosis (ICD10-I70.0) and Emphysema (ICD10-J43.9).    01/20/2019 - 07/14/2019 Chemotherapy   First line FOLFOX  every 2 weeks, starting 01/20/2019. Added Avastin with cycle 4. Switched to every 3 weeks starting 05/05/19. Started chemo break on 07/26/19. Last chemo on 07/14/19.    07/21/2019 Imaging   CT CAP W contrast 07/21/19  IMPRESSION: 1.  Most of the pulmonary metastases have mildly increased in size. A few are stable. 2. Solitary tiny segment 8 right liver metastasis is stable. 3. No new sites of metastatic disease. 4. Stable mild nonspecific annular wall thickening and mucosal hyperenhancement in the rectum. Large colonic stool volume suggests constipation. No significant bowel obstruction. 5. Chronic findings include: Aortic Atherosclerosis (ICD10-I70.0). Coronary atherosclerosis. Large hiatal hernia. Mildly enlarged prostate with chronic bladder and nonobstructing right renal stones.        CURRENT THERAPY:  Pending Hospice, supportive care  INTERVAL HISTORY:  Adrian Banks is here for a follow up. His wife notes Adrian Banks is not talking much or holding conversations, but he understands what is being said. She confirms he had a fall with shoulder dislocation one month ago. He went to ED and was dislocation was fixed. He did go to nursing home. His wife also notes he is not urinating much so his wife is changing him. He had back and groin pain at night. He is not walking much at all, overall bed bound. He was eating but has declined some. He notes very hard BM  Size of grapefruit that his wife saw when she changed him. She notes his legs feel very cold to touch. His wife notes his tylenol is not working.    REVIEW OF SYSTEMS:   Constitutional: Denies fevers, chills or abnormal weight loss (+) Decreased eating.  Eyes: Denies blurriness of vision Ears, nose, mouth, throat, and face: Denies mucositis or  sore throat Respiratory: Denies cough, dyspnea or wheezes Cardiovascular: Denies palpitation, chest discomfort or lower extremity swelling Gastrointestinal:  Denies nausea, heartburn or change in bowel habits (+) back and groin pain  UA: (+) Low urine output  Skin: Denies abnormal skin rashes Lymphatics: Denies new lymphadenopathy or easy bruising Neurological:Denies numbness, tingling or new weaknesses (+) Bed ridden,  leg weakness, falls  Behavioral/Psych: Mood is stable, no new changes  All other systems were reviewed with the patient and are negative.  MEDICAL HISTORY:  Past Medical History:  Diagnosis Date  . Cancer ([place]) 09/29/2017   Rectal cvancer  . High cholesterol     SURGICAL HISTORY: Past Surgical History:  Procedure Laterality Date  . BIOPSY  12/25/2018   Procedure: BIOPSY;  Surgeon: Wonda Horner, MD;  Location: [facility] ENDOSCOPY;  Service: Endoscopy;;  . FLEXIBLE SIGMOIDOSCOPY N/A 12/25/2018   Procedure: Beryle Quant;  Surgeon: Wonda Horner, MD;  Location: [facility] ENDOSCOPY;  Service: Endoscopy;  Laterality: N/A;  . IR IMAGING GUIDED PORT INSERTION  01/11/2019    I have reviewed the social history and family history with the patient and they are unchanged from previous note.  ALLERGIES:  has No Known Allergies.  MEDICATIONS:  Current Outpatient Medications  Medication Sig Dispense Refill  . cephALEXin (KEFLEX) 500 MG capsule 1 cap po bid x 7 days 14 capsule 0  . diphenoxylate-atropine (LOMOTIL) 2.5-0.025 MG tablet Take 1-2 tablets by mouth 4 (four) times daily as needed for diarrhea or loose stools. 60 tablet 1  . finasteride (PROSCAR) 5 MG tablet Take 5 mg by mouth daily.     Marland Kitchen HYDROcodone-Acetaminophen 5-300 MG TABS Take 1 tablet by mouth every 8 (eight) hours as needed. 30 tablet 0  . lidocaine-prilocaine (EMLA) cream Apply to affected area once (Patient not taking: Reported on 12/04/2019) 30 g 3  . lovastatin (MEVACOR) 40 MG tablet Take 40 mg by mouth daily.     . naproxen sodium (ALEVE) 220 MG tablet Take 220 mg by mouth daily as needed (pain).    . ondansetron (ZOFRAN) 8 MG tablet Take 1 tablet (8 mg total) by mouth 2 (two) times daily as needed for refractory nausea / vomiting. Start on day 3 after chemotherapy. (Patient not taking: Reported on 12/04/2019) 30 tablet 1  . prochlorperazine (COMPAZINE) 10 MG tablet Take 1 tablet (10 mg total) by mouth every 6 (six) hours as  needed (Nausea or vomiting). (Patient not taking: Reported on 12/04/2019) 30 tablet 1   No current facility-administered medications for this visit.   Facility-Administered Medications Ordered in Other Visits  Medication Dose Route Frequency Provider Last Rate Last Admin  . heparin lock flush 100 unit/mL  500 Units Intravenous Once Truitt Merle, MD        PHYSICAL EXAMINATION: ECOG PERFORMANCE STATUS: 4 - Bedbound  No vitals taken today, Exam not performed today   LABORATORY DATA:  I have reviewed the data as listed CBC Latest Ref Rng & Units 12/05/2019 12/04/2019 09/01/2019  WBC 4.0 - 10.5 K/uL 11.3(H) 13.5(H) 5.4  Hemoglobin 13.0 - 17.0 g/dL 12.5(L) 12.9(L) 13.4  Hematocrit 39.0 - 52.0 % 38.5(L) 40.2 41.4  Platelets 150 - 400 K/uL 238 218 PLATELET CLUMPS NOTED ON SMEAR, COUNT APPEARS DECREASED     CMP Latest Ref Rng & Units 12/05/2019 12/04/2019 09/01/2019  Glucose 70 - 99 mg/dL 113(H) 121(H) 102(H)  BUN 8 - 23 mg/dL _0 Creatinine 0.61 - 1.24 mg/dL 0.97 0.91 0.97  Sodium 135 -  145 mmol/L 142 142 142  Potassium 3.5 - 5.1 mmol/L 3.8 3.8 3.7  Chloride 98 - 111 mmol/L 109 107 109  CO2 22 - 32 mmol/L _0 Calcium 8.9 - 10.3 mg/dL 8.9 9.3 8.9  Total Protein 6.5 - 8.1 g/dL 6.3(L) - 6.9  Total Bilirubin 0.3 - 1.2 mg/dL 1.3(H) - 0.5  Alkaline Phos 38 - 126 U/L 67 - 78  AST 15 - 41 U/L 53(H) - 18  ALT 0 - 44 U/L 22 - 11      RADIOGRAPHIC STUDIES: I have personally reviewed the radiological images as listed and agreed with the findings in the report. No results found.   ASSESSMENT & PLAN:  Adrian Banks is a 86 y.o. male with    1. Rectal Cancer, invasive adenocarcinoma,with lung and liver metastasis, cTxNxM1, stage IV, MSS, KRAS mutation(+) -Diagnosed in 09/2017.His initial staging scans showedbilateral lung nodules, highly suspicious for lung metastasis.He declined biopsy of his lung lesions. He was treatedwith ChemoRT with Xeloda for his primary rectal  cancer.He lost f/u after 03/2018.  -His 12/25/18 rectal biopsy and2/5/20 Lung biopsy has confirmedrectal caner recurrence with lungmetastasis.  -His FO results shows MSI-stable.KRAS mutation.He is not eligible for immunotherapy or EGFR inhibitor -He was on first line FOLFOX and Avastin with dose reduction from 01/20/19-07/2019 with partial response.  -Per his request he has been on chemo break since 07/2019.  -Since last visit he presented to ED for fall and weakness. He then was staying at Nursing home and has now returned home but became bed bound, with no energy and low appetite.  -His wife notes since he has returned home he is bed ridden with leg weakness, less talking and low conversation, eating less, lower urine output, cold legs, back and groin pain. His wife and daughter have been doing their best to take care of him.  -I think his deterioration is related to his underlying metastatic cancer, which incurable. I recommend home hospice care as he is not a candidate for more cancer treatment, and his life expectancy is likely going to be a few months or less.  We also discussed residential hospice if he needed. -Patient's wife was reluctant to take hospice, after lengthy discussion, she agreed. -I will call in hydrocodone for her pain.  -F/u open   2.Goal of care discussion -The patient understands the goal of care is palliative. -I recommended DNR/DNI,he is agreeable with DNR/DNI.   3. Dementia -Pt has appeared to havesome degree of cognitive dysfunction since I saw him the first time, this is much worsewhen he was on chemo, I think he needs further evaluation  -Ipreviouslydiscussed withhis wife. She states pt does not talk much at home, he functions well, she have not noticed any change of his memory orbehavioral issues. -stable   4. Neuropathy, Imbalance, Falls   -Secondary to previous chemo with numbness in fingers and has right leg weakness. He is no longer on  treatment.  -He is currently on chemo break.  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  -He has another fall on 12/04/19 with shoulder dislocation. This was treated in ED.    Plan  -I will call in hydrocodone today  -Send hospice referral  -F/u open, I will remain to be his MD while he is on hospice care.   No problem-specific Assessment & Plan notes found for this encounter.   No orders of the defined types were placed in this encounter.  I discussed the assessment  and treatment plan with the patient. The patient was provided an opportunity to ask questions and all were answered. The patient agreed with the plan and demonstrated an understanding of the instructions.  The patient was advised to call back or seek an in-person evaluation if the symptoms worsen or if the condition fails to improve as anticipated.  The total time spent in the appointment was 22 minutes.    Truitt Merle, MD 01/04/2020   I, Joslyn Devon, am acting as scribe for Truitt Merle, MD.   I have reviewed the above documentation for accuracy and completeness, and I agree with the above.

## 2020-01-05 ENCOUNTER — Telehealth: Payer: Self-pay

## 2020-01-05 NOTE — Telephone Encounter (Signed)
Newman Nickels RN with Hospice calls to let us know that he was admitted for Hospice services today, their doctor will sign the DNR form, they are putting him on some home O2 at 2 liters because his Sats are 87% on room air.

## 2020-01-26 ENCOUNTER — Ambulatory Visit: Payer: Medicare Other | Admitting: Orthopaedic Surgery

## 2020-03-08 DEATH — deceased

## 2020-06-20 IMAGING — CT CT ABD-PELV W/ CM
2 of 5 series · 15 of 46 positions shown, 17 images · IV contrast (omnipaque)
Comparison: CT abdomen pelvis 10/28/2017

CLINICAL DATA: Diarrhea

EXAM:
CT ABDOMEN AND PELVIS WITH CONTRAST
TECHNIQUE: Multidetector CT imaging of the abdomen and pelvis was performed
using the standard protocol following bolus administration of
intravenous contrast.
CONTRAST:  100mL OMNIPAQUE IOHEXOL 300 MG/ML  SOLN

[Series 3: a/p w/ 5mm · axial · 0.71mm/px · z∈[+1084,+1464]mm · 12 of 88 slices shown, 14 images]
[im 6/88  soft-tissue]
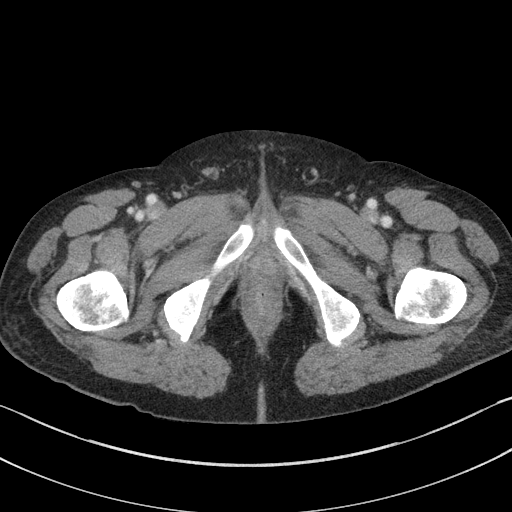
[im 6/88  bone]
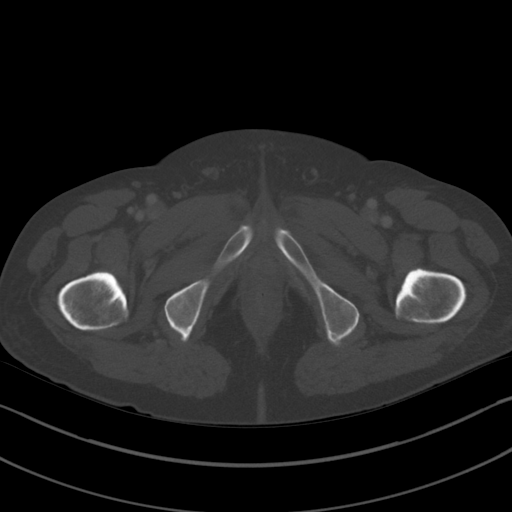
[im 16/88  soft-tissue]
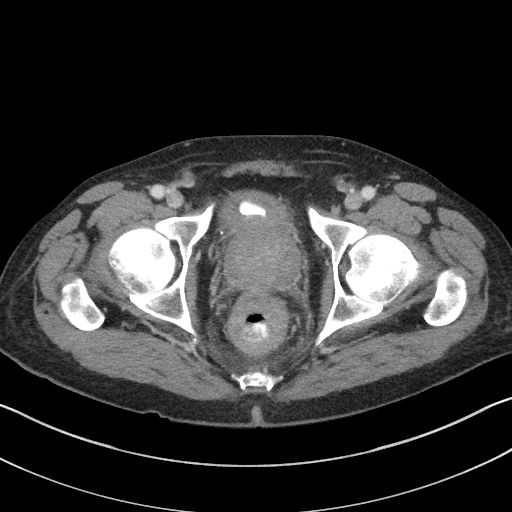
[im 21/88  soft-tissue]
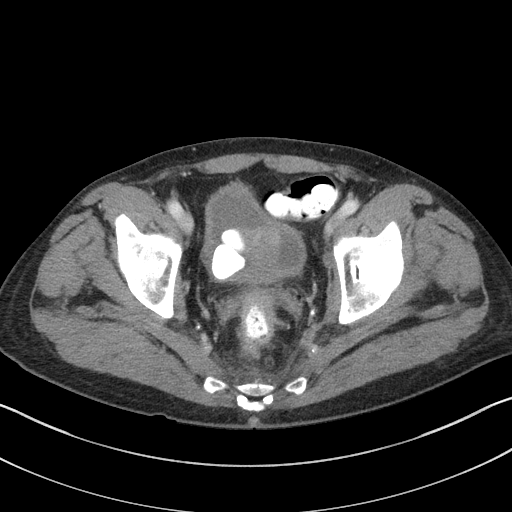
[im 26/88  soft-tissue]
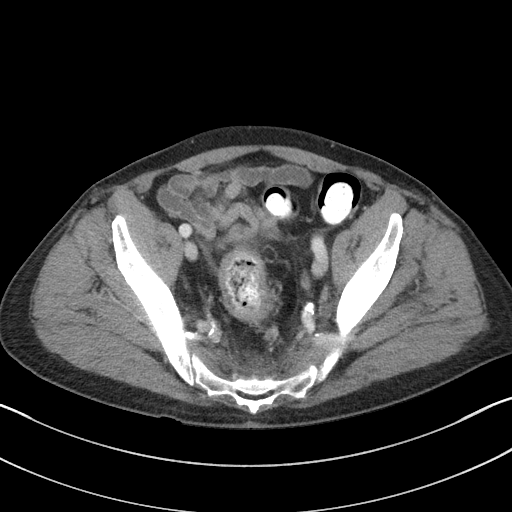
[im 36/88  soft-tissue]
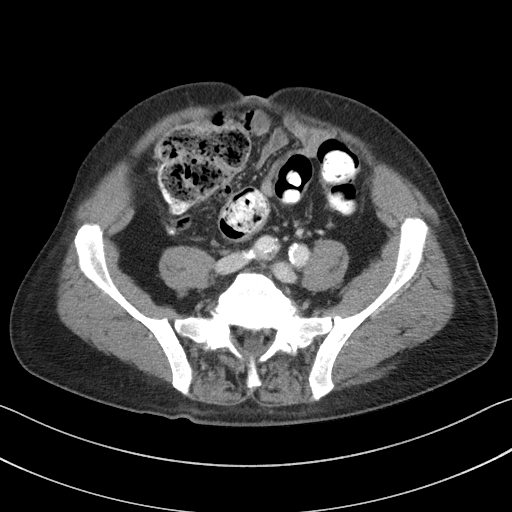
[im 41/88  soft-tissue]
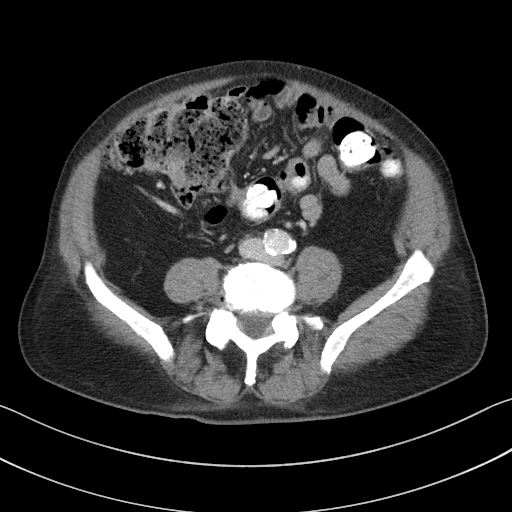
[im 47/88  soft-tissue]
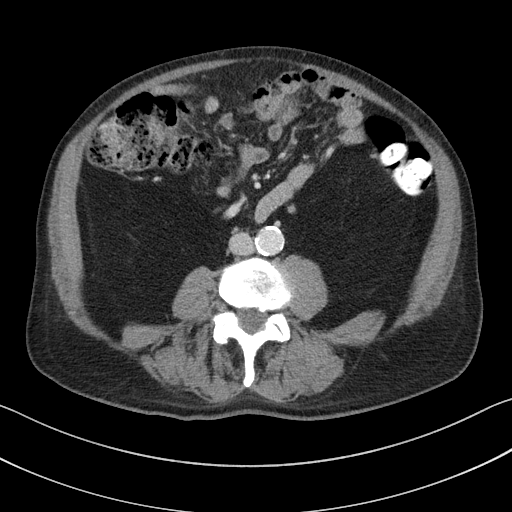
[im 57/88  soft-tissue]
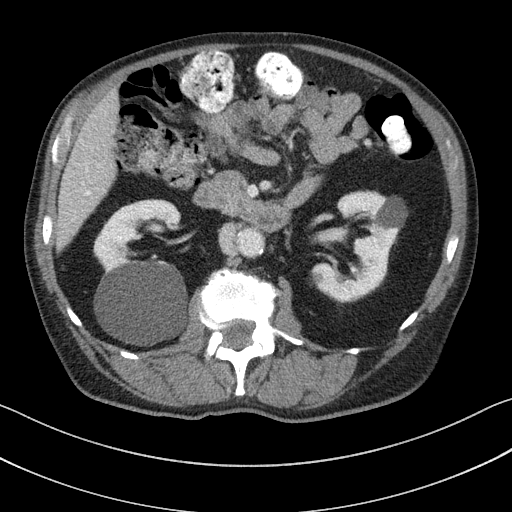
[im 62/88  soft-tissue]
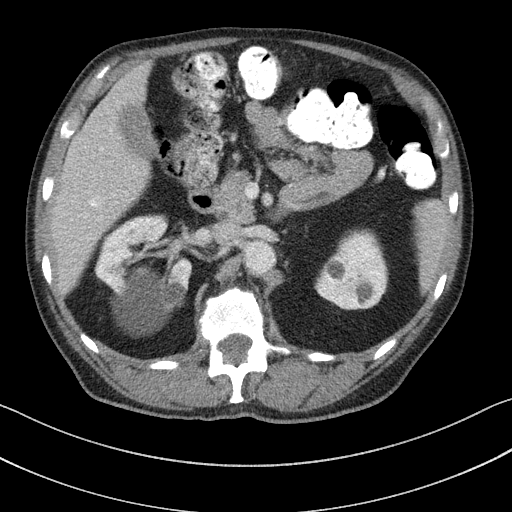
[im 62/88  bone]
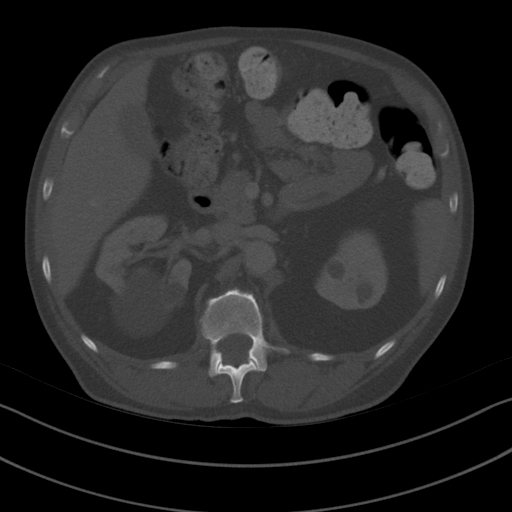
[im 67/88  soft-tissue]
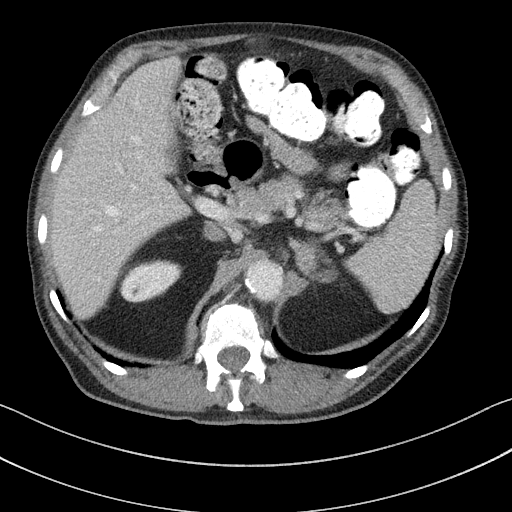
[im 77/88  soft-tissue]
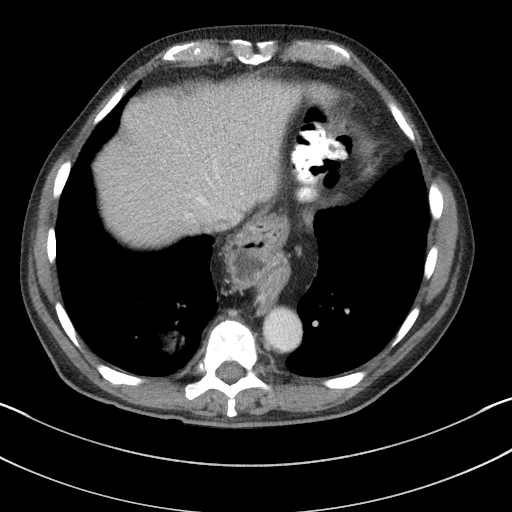
[im 82/88  soft-tissue]
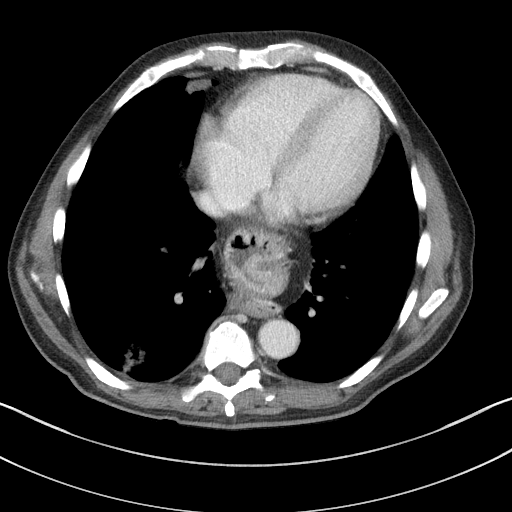

[Series 6: a/p w/ cor · coronal · 0.68mm/px · 3 of 131 slices shown]
[im 44/131  soft-tissue]
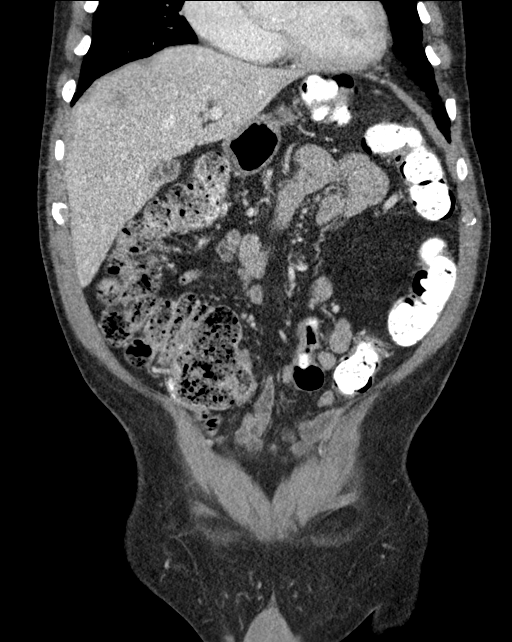
[im 58/131  soft-tissue]
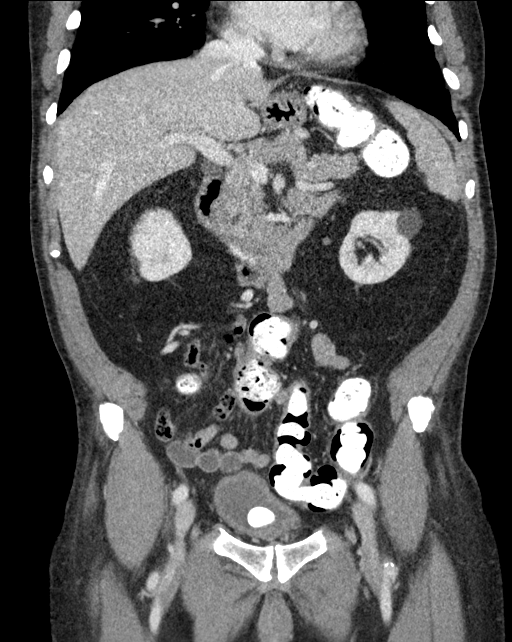
[im 73/131  soft-tissue]
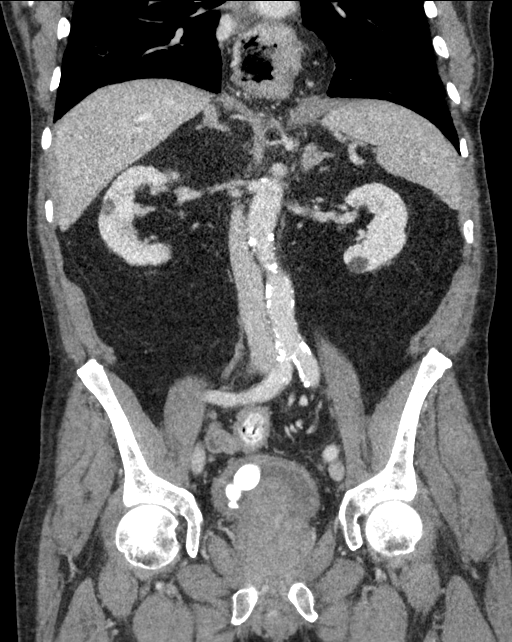

[15 of 46 positions shown; findings below may reference images not displayed]

FINDINGS: LOWER CHEST: There is a posterior right lung base mass measuring
x 3.8 cm, previously 1.8 x 2.0 cm. There are multiple right lower
lobe nodules, measuring up to 2.5 cm. Two of the nodules show
cavitation. These findings have worsened since 10/28/2017.

HEPATOBILIARY: There is a hypoattenuating lesion in the right
hepatic lobe that measures 1.4 cm and is new compared to the prior
studies. Remainder of the liver is normal. The gallbladder is
normal.

PANCREAS: The pancreatic parenchymal contours are normal and there
is no ductal dilatation. There is no peripancreatic fluid
collection.

SPLEEN: Normal.

ADRENALS/URINARY TRACT:

--Adrenal glands: Normal.

--Right kidney/ureter: There are multiple renal cysts, measuring up
to 7 cm. The distribution of lesions is unchanged.

--Left kidney/ureter: There are multiple renal cysts, the largest of
which measures 2.4 cm, unchanged.

--Urinary bladder: Multiple large calculi within the urinary
bladder.

STOMACH/BOWEL:

--Stomach/Duodenum: Intermediate sized hiatal hernia.

--Small bowel: No dilatation or inflammation.

--Colon: There is wall thickening and enhancement of the rectum and
distal sigmoid colon.

--Appendix: Normal.

VASCULAR/LYMPHATIC: There is aortic atherosclerosis without
hemodynamically significant stenosis. The portal vein, splenic vein,
superior mesenteric vein and IVC are patent. 5 mm right pelvic
sidewall node has decreased in size since the prior study..

REPRODUCTIVE: Enlarged prostate measures 5.4 x 4.7 cm.

MUSCULOSKELETAL. Multilevel degenerative disc disease and facet
arthrosis. No bony spinal canal stenosis.

OTHER: None.
IMPRESSION: 1. Wall thickening and enhancement of the rectum and distal sigmoid
colon, consistent with known rectal carcinoma.
2. New hypoattenuating lesion in the right hepatic lobe, concerning
for metastatic disease.
3. Increased size of posterior right lung base mass, now measuring
5.4 x 3.8 cm, previously 1.8 x 2.0 cm. Multiple right lower lobe
pulmonary nodules, worsened since 10/28/2017, and consistent with
metastatic disease.
4. Multiple large calculi within the urinary bladder.
